# Patient Record
Sex: Female | Born: 1950
Health system: Southern US, Community
[De-identification: ages and names within clinical notes are randomized; demographics above are authoritative.]

## PROBLEM LIST (undated history)

## (undated) DIAGNOSIS — C801 Malignant (primary) neoplasm, unspecified: Secondary | ICD-10-CM

## (undated) DIAGNOSIS — N289 Disorder of kidney and ureter, unspecified: Secondary | ICD-10-CM

## (undated) DIAGNOSIS — M1990 Unspecified osteoarthritis, unspecified site: Secondary | ICD-10-CM

## (undated) DIAGNOSIS — I639 Cerebral infarction, unspecified: Secondary | ICD-10-CM

## (undated) DIAGNOSIS — D849 Immunodeficiency, unspecified: Secondary | ICD-10-CM

## (undated) DIAGNOSIS — E119 Type 2 diabetes mellitus without complications: Secondary | ICD-10-CM

## (undated) DIAGNOSIS — I10 Essential (primary) hypertension: Secondary | ICD-10-CM

## (undated) DIAGNOSIS — M329 Systemic lupus erythematosus, unspecified: Secondary | ICD-10-CM

## (undated) HISTORY — PX: CATARACT EXTRACTION: SUR2

## (undated) HISTORY — PX: TONSILLECTOMY: SUR1361

---

## 2001-10-09 ENCOUNTER — Encounter: Admission: RE | Admit: 2001-10-09 | Discharge: 2002-01-07 | Payer: Self-pay | Admitting: *Deleted

## 2001-10-12 ENCOUNTER — Other Ambulatory Visit: Admission: RE | Admit: 2001-10-12 | Discharge: 2001-10-12 | Payer: Self-pay | Admitting: *Deleted

## 2008-05-15 ENCOUNTER — Ambulatory Visit: Payer: Self-pay | Admitting: Cardiology

## 2008-05-15 ENCOUNTER — Inpatient Hospital Stay (HOSPITAL_COMMUNITY): Admission: EM | Admit: 2008-05-15 | Discharge: 2008-05-20 | Payer: Self-pay | Admitting: Emergency Medicine

## 2008-05-16 ENCOUNTER — Ambulatory Visit: Payer: Self-pay | Admitting: Vascular Surgery

## 2008-05-16 ENCOUNTER — Encounter (INDEPENDENT_AMBULATORY_CARE_PROVIDER_SITE_OTHER): Payer: Self-pay | Admitting: Internal Medicine

## 2008-05-20 ENCOUNTER — Encounter: Payer: Self-pay | Admitting: Cardiology

## 2008-06-03 ENCOUNTER — Encounter: Admission: RE | Admit: 2008-06-03 | Discharge: 2008-09-01 | Payer: Self-pay | Admitting: Internal Medicine

## 2008-07-09 ENCOUNTER — Inpatient Hospital Stay (HOSPITAL_COMMUNITY): Admission: EM | Admit: 2008-07-09 | Discharge: 2008-07-12 | Payer: Self-pay | Admitting: Emergency Medicine

## 2008-09-10 ENCOUNTER — Encounter: Admission: RE | Admit: 2008-09-10 | Discharge: 2008-10-21 | Payer: Self-pay | Admitting: Internal Medicine

## 2011-03-09 NOTE — Discharge Summary (Signed)
Rhonda Contreras, Rhonda Contreras                ACCOUNT NO.:  [id number]   MEDICAL RECORD NO.:  [id number]          PATIENT TYPE:  INP   LOCATION:  5023                         FACILITY:  [facility]   PHYSICIAN:  Georgann Housekeeper, MD      DATE OF BIRTH:  Dec 24, 1950   DATE OF ADMISSION:  07/09/2008  DATE OF DISCHARGE:  07/12/2008                               DISCHARGE SUMMARY   DISCHARGE DIAGNOSES:  1. Acute renal failure.  2. Dehydration.  3. Elevated INR.  4. Hypertension.  5. History of cerebrovascular accident with right-sided weakness.  6. Diabetes.   MEDICATION ON DISCHARGE:  1. Coumadin 2.5 mg daily with 5 mg Wednesday.  2. Metoprolol 100 mg b.i.d.  3. Pravachol 40 mg daily.  4. Amaryl 1 mg daily.  5. Aspirin 81 mg daily.  6. Metformin 1000 mg in evening.  7. Lisinopril HCT 10/25 on hold, followup in 1 week.   LABORATORY DATA:  Blood work on admission, creatinine 4.7, BUN of 13,  sodium 136, and potassium 5.2.  On discharge, creatinine was 1.16, BUN  of 18, potassium 3.5, sodium 139, and glucose 113.  White count was 8.1  and hemoglobin 11.9.  LFTs were normal.  INR on admission was 6.6.  At  the time of discharge was 1.3.   On vitals, blood pressure 129/71, pulse 85, 97.9 temperature, and sats  99% on room air.   HOSPITAL COURSE:  A 60 year old female with history of bilateral CVA  with right-sided weakness, diabetes, hypertension, and depression came  in with nausea, vomiting, acute dehydration, and acute renal failure  with creatinine up to 4.7.  She also had some nosebleed and found to  have elevated INR.  1. Dehydration and acute renal failure.  She was aggressively      hydrated.  Her metformin and lisinopril was held and with      aggressive hydration, her renal function completely resolved.      Creatinine back to normal at 1.16 and her electrolytes corrected.      As far as her blood pressure remained stable, she was continued on      metoprolol only.  As far as her  hypertension, the patient will      continue on metoprolol and we will follow up outpatient blood      pressure if elevated might consider adding another blood pressure      agent either ACE inhibitor without the diuretics as far as.  2. Type 2 diabetes.  Her metformin was hold as well as Amaryl.  Her      blood sugars initially all remained normal within 100.  When she      starts eating, we will start her back on Amaryl 1 mg and metformin      1000 mg a day and the patient starts eating in the hospital as far      as the hypercoagulable anticoagulation, so supratherapeutic INR in      admission was causing her nosebleeds.  Her Coumadin was held as      well as aspirin.  Nosebleed resolved.  Hemoglobin remained stable.      She had no evidence of any GI bleed as far as she will restart back      on baby      aspirin as well as the Coumadin with the same home dose.  Followup      INR at the office as far as her dyslipidemia.  Continue on      Pravachol.  The patient's Celexa was discontinued.  We thought was      causing her nausea and followup in 1 week at the office.      Georgann Housekeeper, MD  Electronically Signed     KH/MEDQ  D:  07/12/2008  T:  07/12/2008  Job:  [id number]

## 2011-03-09 NOTE — Discharge Summary (Signed)
NAMECALAIS, SVEHLA                ACCOUNT NO.:  [id number]   MEDICAL RECORD NO.:  [id number]          PATIENT TYPE:  INP   LOCATION:  3705                         FACILITY:  [facility]   PHYSICIAN:  Lonia Blood, M.D.       DATE OF BIRTH:  1951/06/30   DATE OF ADMISSION:  05/15/2008  DATE OF DISCHARGE:  05/20/2008                               DISCHARGE SUMMARY   PRIMARY CARE PHYSICIAN:  Georgann Housekeeper, MD, Bennye Alm.   DISCHARGE DIAGNOSES:  For complete list of discharge diagnoses, please  refer to the previously dictated discharge summary done by Dr. Marthann Schiller.  Briefly, though this is a patient with bilateral strokes,  hypertension, diabetes mellitus type 2, hyperlipidemia, and positive  lupus anticoagulant.   DISCHARGE MEDICATIONS:  1. Coumadin 5 mg by mouth at 6 p.m. daily.  2. Aspirin 81 mg daily.  3. Lipitor 20 mg daily.  4. Metformin 1000 mg twice a day.  5. Lisinopril/hydrochlorothiazide 10/25 mg daily.  6. Metoprolol 100 mg twice a day.  7. Glimepiride 1 mg each morning.   CONDITION ON DISCHARGE:  Ms. Pinkley is discharged in fair condition.  She  is able to ambulate on her own.  She seems to be alert and oriented to  place, person, and time.  She has slight dysarthria, some facial  asymmetry, and she has a right upper extremity 3/5 hemiparesis.  The  patient is set up to follow up with outpatient rehabilitation center at  [facility] on [address]  The patient is set up to  follow up with Dr. Donette Larry on May 23, 2008, at 1:15 p.m. office of the  [facility] [place].   PROCEDURES DURING THIS ADMISSION:  1. May 16, 2008, the patient underwent an MRI of the brain with      findings of scattered acute small vessel infarctions throughout      both cerebral hemispheres suggesting embolic disease.  2. May 16, 2008, carotid ultrasounds which shows no significant ICA      stenosis.  3. May 20, 2008, transesophageal echocardiogram by Dr. Olga Millers      with findings of normal left ventricular function, normal left      atrium, no left appendage thrombus, normal right atrium and right      ventricle, and no pericardial effusion.   CONSULTATIONS DURING THIS ADMISSION:  This patient was seen in  consultation by Dr. Kelli Hope from Neurology and by Dr. Pearlean Brownie  from Neurology.   HOSPITAL COURSE:  For complete list of hospital course, refer to the  previously dictated discharge summary done by Dr. Marthann Schiller.  Basically today, May 20, 2008, Ms. Paulson' neurological status has been  found to be stable.  She is already therapeutic on her Coumadin with a  discharge INR of 2.1.  The patient's antiphospholipid antibody syndrome  panel came back positive and she will need a referral as an outpatient  to a hematologist.  We  have elected to treat the patient with Coumadin and aspirin for her  strokes as recommended by Dr. Pearlean Brownie  from Neurology.  Ms. Claytor will be  also treated for diabetes and hypertension and she will follow up  closely for Coumadin adjustments with her new primary care physician.      Lonia Blood, M.D.  Electronically Signed     SL/MEDQ  D:  05/20/2008  T:  05/21/2008  Job:  [id number]   cc:   Georgann Housekeeper, MD

## 2011-03-09 NOTE — H&P (Signed)
Rhonda Contreras, TROSTLE                ACCOUNT NO.:  [id number]   MEDICAL RECORD NO.:  [id number]          PATIENT TYPE:  INP   LOCATION:  5023                         FACILITY:  [facility]   PHYSICIAN:  Georgann Housekeeper, MD      DATE OF BIRTH:  1951-07-02   DATE OF ADMISSION:  07/09/2008  DATE OF DISCHARGE:                              HISTORY & PHYSICAL   CHIEF COMPLAINT:  Weakness, nausea, vomiting, and nosebleed.   HISTORY OF PRESENT ILLNESS:  A 60 year old female with past medical  history of recent CVA bilateral with right-sided weakness, diabetes,  hypertension, and depression, lives with her sister, came in complaining  of nausea, vomiting, not able to keep anything down for last 5 days, and  episode of intermittent nosebleed.  She also noticed last night episode  of some blood in the rectum, none today.  The patient denies any fever.  No chest pain.  No abdominal pain.  No shortness of breath.  No  headache.  She has lost about 8 or 10 pounds since last week and the  sister says she is not eating, feel weak to get up and walk, was doing  good with the therapy.  She is on Coumadin and baby aspirin.  In the  office, her blood work, she found to have INR of 6.6 with the blood  chemistries; creatinine of 4.7 and BUN of 105.  Hemoglobin 14.4.  Normal  electrolytes.  White count of 12.4, admitted for management and further  care.   PAST MEDICAL HISTORY:   ALLERGIES:  No known drug allergies.   CURRENT MEDICATIONS:  1. Lisinopril and hydrochlorothiazide 10/25 one a day.  2. Metoprolol 100 mg b.i.d.  3. Aspirin 81 mg daily.  4. Coumadin 2.5 mg daily with 5 on Wednesday.  5. Amaryl 1 mg daily.  6. Metformin 1000 mg b.i.d.  7. Pravachol 40 mg nightly.  8. Celexa 10 mg daily.   PAST MEDICAL HISTORY:  1. CVA in July 2009, bilateral with right-sided weakness.  2. Hypercoagulable workup, positive lupus anticoagulant.  3. Hyperlipidemia.  4. Type 2 diabetes.  5. Hypertension.  6.  Depression.   SOCIAL HISTORY:  She lives now with her sister, Rhonda Contreras.  No  children.  No tobacco or alcohol.   PHYSICAL EXAMINATION:  VITAL SIGNS:  Blood pressure 120/70, temperature  98.3, pulse 72, and weight 108 pounds.  GENERAL:  Weak-appearing female.  HEENT:  Some dry blood in either side of the nose with some fresh blood  on the tissue on both nasal passages were mildly congested.  Oropharynx  clear.  NECK:  Supple.  LUNGS:  Clear.  CARDIAC:  Regular rhythm without murmur.  ABDOMEN:  Soft and nontender.  RECTAL:  With chaperon, guaiac-negative brown stool.   LABORATORY DATA:  Hemoglobin of 14.3, white count of 12.4, and platelets  188.  INR 6.6.  Potassium 4.4, BUN of 105, creatinine 4.7, sodium 136,  chloride 98, and glucose 152.   IMPRESSION:  A 60 year old female with history of recent stroke;  hypertension; and diabetes, on Coumadin and aspirin, presented with  acute-on-chronic renal failure secondary to dehydration, nausea,  vomiting, and elevated INR.   PLAN:  1. Admit to hospital, IV fluids.  2. We will hold her metformin, lisinopril, as well as Celexa.  Nausea      and vomiting, most likely Celexa related.  We will hold that and      also from uremia.  3. Acute renal failure.  She had mild renal insufficiency with      creatinine 1.6 last month.  Aggressive IV fluids and repeat blood      chemistries.  4. As far as diabetes, hold metformin and Amaryl, sliding scale      insulin.  5. Hypertension.  Continue on metoprolol.  Repeat blood work Advertising account executive.      Georgann Housekeeper, MD  Electronically Signed     KH/MEDQ  D:  07/09/2008  T:  07/10/2008  Job:  [id number]

## 2011-03-09 NOTE — Consult Note (Signed)
Rhonda Contreras, Rhonda Contreras                ACCOUNT NO.:  [id number]   MEDICAL RECORD NO.:  [id number]          PATIENT TYPE:  INP   LOCATION:  3705                         FACILITY:  [facility]   PHYSICIAN:  Michael L. Reynolds, M.D.DATE OF BIRTH:  Aug 18, 1951   DATE OF CONSULTATION:  05/18/2008  DATE OF DISCHARGE:                                 CONSULTATION   REQUESTING PHYSICIAN:  Altha Harm, MD.   REASON FOR EVALUATION:  Stroke.   HISTORY OF PRESENT ILLNESS:  This is the initial inpatient consultation  evaluation of this 60 year old woman with a past medical history which  includes untreated hypertension.  She presented to the emergency  department on May 15, 2008, in the afternoon after experiencing onset  that morning of slurred speech and generalized weakness, worse on the  right side.  She was out of the window for acute intervention and code  stroke was not called.  Symptoms then improved somewhat, she was  admitted to the hospital service.  She has undergone a stroke workup.  She had an MRI of the brain with MRA of intracranial circulation, which  demonstrated the presence of scattered acute stroke, left greater than  right, with the largest being in the left frontoparietal subcortical  white matter.  The pattern suggests embolic disease.  MRA was  unremarkable.  She had 2-D echocardiogram which was hyperdynamic,  without wall motion abnormalities, and demonstrating some obliteration  of the ventricular space.  She has had a hypercoagulable panel, which  demonstrated the presence of a lupus anticoagulant.  She was started on  Aggrenox on admission and lupus anticoagulants came back, she was  started on heparin and Coumadin.  She has had significant issues with  blood pressure elevations before, which has lead her to be transferred  back to the ICU, and she received several doses of intravenous  medications for blood pressure.  Today, she reports that her weakness is  actually  worse today than it was yesterday in her arm, although she is  still doing well with swallowing.  She states the speech is little bit  slurred.  Neurologic consultation is requested for further  considerations.   PAST MEDICAL HISTORY:  She states that she has been told that she was  hypertensive in the past, was on blood pressure medicines in the past,  but after she lost her job was unable to afford these.  She also was  told she was borderline diabetic in the past, and had been trying to  control that with diet.   FAMILY/SOCIAL/REVIEW OF SYSTEMS:  Per admission H&P of May 15, 2008,  which is reviewed.  Of note, her mother died at young age from MI, and  she is nonsmoker.   MEDICATIONS:  She was on no medications prior to admission.  Her present  list of medications include Aggrenox, Lipitor, intravenous heparin,  hydrochlorothiazide, sliding scale insulin, metformin, metoprolol 50 mg  b.i.d., Protonix, and Coumadin per protocol.   PHYSICAL EXAMINATION:  VITAL SIGNS:  Temperature 97.8, blood pressure  123/36, pulse 86, respirations 16, and O2 sat 93% on  room air.  GENERAL:  This is a healthy-appearing woman, supine in the hospital bed,  in no distress.  HEENT:  Head, cranial nerves normocephalic and atraumatic.  Oropharynx  benign.  NECK:  Supple without carotid or supraclavicular bruits.  HEART:  Regular rate without murmurs.  NEUROLOGICAL:  On mental status, she is awake and alert.  She is  oriented to time, place, and person.  Recent and remote memory are  intact.  Attention span, concentration, and fund of knowledge are all  appropriate for the situation.  Speech is fluent and mildly dysarthric.  She is able to name objects and repeat phrases.  Cranial nerves, pupils  are equal and reactive.  Extraocular movements are full without  nystagmus.  Visual fields full to confrontation.  Hearing is intact to  conversation and speech.  Facial sensation is intact to pinprick.  She   has significant right facial paralysis, but can move the tongue and  palate in the midline.  Motor, normal bulk and tone.  She has just  antigravity strength in the proximal right upper extremity, really  cannot move or cannot extend the right fingers at all, there is some  flexion of the right fingers.  Strength in the right lower extremity is  normal and strength in the left is normal.  Sensation is intact to light  touch and double stimulation in all extremities.  Coordination, finger-  to-nose is performed well on the left and cannot be done on the right.  She is able to do heel-to-shin on the right.  Gait is deferred.  Reflexes are 1+ and symmetric.  Toes are upgoing bilaterally.   LABORATORY DATA:  CBC from this morning; white count 9.4, hemoglobin  14.6, and platelets 161,000.  INR this morning is 1.0.  Hypercoagulable  demonstrates presence of lupus anticoagulant as above.  Homocysteine  level done.  Fasting is normal.  RPR is negative.  B12 is normal.  Hemoglobin A1c is 9.0.  TSH is normal.  Lipids are significantly  abnormal, demonstrating total cholesterol 228, triglycerides 218, HDL  31, and calculated LDL 153.  MRI of the brain with MRA of intracerebral  circulation is as above.  A 2-D echo demonstrates hyperdynamic LV, no  wall motion abnormalities, some midcavity left ventricular obliteration  was seen.  She also has a moderate-to-marked LVH.  Telemetry monitoring  showed no arrhythmias.   IMPRESSION:  1. Multiple acute left greater than right brain strokes, suspect      cardio or aortoembolic, with dysarthria and right upper extremity      weakness.  This is worsened today which may be due to hemorrhagic      transformation related to heparin, or possibly due to [facility]      treatment of her blood pressure.  2. Multiple uncontrolled vascular risk factors including hypertension,      diabetes, and hypercholesterolemia.  3. Possible lupus anticoagulant without  evidence of a clear history of      hypercoagulable syndrome.   RECOMMENDATIONS:  Check CT now to exclude hemorrhagic transformation of  worsening of the stroke and discontinue all p.r.n. blood pressure  medications.  She needs transesophageal echo.  For now, we will continue  anticoagulation, although I am not convinced that this is indicated long-  term, and also that she will be an appropriate candidate given that she  has been noncompliant with medication for few years.  Stroke consult  service to follow with you.   Thank you for  this consultation.      Michael L. Thad Ranger, M.D.  Electronically Signed     MLR/MEDQ  D:  05/18/2008  T:  05/18/2008  Job:  [id number]

## 2011-03-09 NOTE — H&P (Signed)
Rhonda Contreras, Rhonda Contreras                ACCOUNT NO.:  [id number]   MEDICAL RECORD NO.:  [id number]          PATIENT TYPE:  INP   LOCATION:  3728                         FACILITY:  [facility]   PHYSICIAN:  Lonia Blood, M.D.      DATE OF BIRTH:  15-Apr-1951   DATE OF ADMISSION:  05/15/2008  DATE OF DISCHARGE:                              HISTORY & PHYSICAL   PRIMARY CARE PHYSICIAN:  The patient is unassigned to [place].  She has not  seen any doctor in 3 years.   PRESENTING COMPLAINT:  Slurred speech and left upper arm weakness.   HISTORY OF PRESENT ILLNESS:  The patient is a 60 year old female with no  significant past medical history who has not seen a doctor in about 3  years.  She has been doing fine until this afternoon when she  experienced sudden weakness in her right upper extremity.  She was also  noted to have slurred speech.  The patient was rushed over here by EMS.  Her speech has improved, but not normalized.  Denied any fever.  No  weakness in her lower extremities.  No nausea or vomiting, no diarrhea.  The patient does not know her cholesterol status.   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   ALLERGIES:  She has no known drug allergies.   MEDICATIONS:  None.   SOCIAL HISTORY:  The patient lives in [place].  Denied any tobacco  use.  She drinks wine on and off socially.   FAMILY HISTORY:  Mother died at the age of 22 from MI.  Sister has  diabetes and hypertension.   REVIEW OF SYSTEMS:  12-point review of systems is negative except per  HPI.   PHYSICAL EXAMINATION:  VITAL SIGNS:  Temperature 97.3, blood pressure  210/99, lying down to 193/111, pulse 140, respiratory rate 24, and sats  94% on room air.  GENERAL:  The patient is pleasant, awake, alert, oriented in no acute  distress.  HEENT:  The patient has right facial droop with some mild slurred  speech.  No pallor, no jaundice.  NECK:   Supple.  No JVD.  No lymphadenopathy.  RESPIRATORY:  The patient has good air entry bilaterally.  No wheezes.  No rales.  CARDIOVASCULAR SYSTEM:  She has S1 and S2, no murmurs.  ABDOMEN:  Soft and nontender with positive bowel sounds.  EXTREMITIES:  No edema, cyanosis, or clubbing.  NEUROLOGIC:  Cranial nerves other than the facial seemed to be intact.  She has facial droop on the left as indicated.  She has power 4/5 in the  right upper extremities.  No change in strength in the left upper  extremity.  She has 5/5 in both lower extremities respectively.  Reflexes 2+ in upper and lower extremities respectively.   LABORATORY DATA:  White count 8.2, hemoglobin 15.1, and platelet count  174.  PTT is 37.  Her head CT without contrast showed no evidence for  intracranial hemorrhage, numerous deep white matter, thalamic and basal  ganglia lucencies, likely chronic  but small focus of acute infection,  cannot be completely excluded.   EKG showed sinus tachycardia with a rate of 114, normal intervals.  Poor  R-wave progression.   ASSESSMENT:  This is a 60 year old presenting with what appears to be an  acute cerebrovascular accident.  The patient still has residual left  facial droop, as well as right upper extremity weakness, indicating that  this lesion is more central in nature.   PLAN:  1. Acute CVA.  We will admit the patient.  The patient has already      passed swallow evaluation bedside, so we can go ahead and feed her      as necessary.  We will check MRI, MRA of the brain, carotid      Dopplers, 2-D echo, and homocysteine level.  I will check RPR and      B12 levels as well.  Get PT, OT involved and also get the stroke      team involved.  2. Hypertension.  The patient denied being a hypertensive.  In the      setting of acute CVA, however, we will get persmissive blood      pressure of a systolic between 098 and 180.  I will, therefore, use      IV labetalol p.r.n. to keep the  blood pressure in that range.      Ultimately, the patient will have to be on blood pressure      medications.  3. Unknown cholesterol status.  We will check fasting lipid panel to      decide for what the patient's cholesterol level is and see if we      need to get a stat.  Otherwise, further treatment will depend on      the patient's response in hospital.      Lonia Blood, M.D.  Electronically Signed     LG/MEDQ  D:  05/15/2008  T:  05/16/2008  Job:  [id number]

## 2011-03-09 NOTE — Discharge Summary (Signed)
Rhonda Contreras, Rhonda Contreras                ACCOUNT NO.:  [id number]   MEDICAL RECORD NO.:  [id number]          PATIENT TYPE:  INP   LOCATION:  3705                         FACILITY:  [facility]   PHYSICIAN:  Altha Harm, MDDATE OF BIRTH:  Aug 02, 1951   DATE OF ADMISSION:  05/15/2008  DATE OF DISCHARGE:                               DISCHARGE SUMMARY   INTERIM DISCHARGE SUMMARY.   DATE OF INTERIM DISCHARGE SUMMARY:  May 19, 2008.   DISCHARGE DIAGNOSES:  1. Bilateral cerebrovascular accidents with right hemiparesis.  2. Newly diagnosed hypertension, poorly controlled.  3. Newly diagnosed diabetes type 2.  4. Newly diagnosed hyperlipidemia.  5. Positive lupus anticoagulant, awaiting confirmatory      antiphospholipid antibody testing.   DISCHARGE MEDICATIONS:  To be determined at the time of discharge.   CONSULTANTS:  1. Michael L. Thad Ranger, MD, Neurology.  2. [facility].   PROCEDURES:  None so far, however patient anticipating transesophageal  echocardiogram to be done tomorrow, May 20, 2008.   DIAGNOSTIC STUDIES:  1. CT head without contrast done on admission which showed:  No      evidence of intracranial hemorrhage.  Numerous deep white matter      thalamic and basal ganglia lucencies, likely chronic but small foci      of acute infarction cannot be completely excluded.  2. MRI of the head without contrast which showed a background pattern      of old small vessel infarctions throughout the brain.  Scattered      acute small vessel infarctions throughout both cerebral      hemispheres, more numerous on the left than the right.  The      findings suggest embolic disease from a cardiac or ascending aorta      source.  3. MRA of the head which shows normal appearance of the large and      medium-sized vessels.  4. CT of the head without contrast done on July 25th which shows small      acute infarcts seen on MRI are not as well visualized on CT scan,      no evidence  of hemorrhage or new abnormality.  5. Carotid Dopplers which shows no significant right ICA stenosis or      no significant left ICA stenosis.   PERTINENT LABORATORY STUDIES:  Lupus anticoagulant positive, awaiting  results of antiphospholipid antibody, homocysteine levels within normal  limits.  Antithrombin 3, protein C, protein S all within normal or  elevated limits.  Total cholesterol 228, triglycerides 218, LDL 153,  VLDL 44, HDL 31, RPR nonreactive.   CODE STATUS:  Full code.   ALLERGIES:  No known drug allergies.   PRIMARY CARE PHYSICIAN:  Unassigned.   CHIEF COMPLAINT:  Slurred speech and left upper arm weakness.   HISTORY OF PRESENT ILLNESS:  Please see the H&P dictated by Dr. Mikeal Hawthorne  for details of the HPI.  However, this is a right-handed female who  presented with slurred speech and right upper extremity weakness.  The  patient was out of the window for any thrombolytics.  We  are asked to  admit the patient for new stroke.  1. CVA.  The patient presented with clear signs of having had a CVA.      The radiologic studies confirmed that the patient had a brain      stroke with multiple areas of acute infarction bilaterally.      However, her symptoms are more prominent on the right side.  The      pattern of stroke certainly presented as though embolic and the      patient had a transthoracic echocardiogram which did not show any      evidence of emboli.  The patient has been scheduled for a      transesophageal echocardiogram to be performed tomorrow, on May 20, 2008, by the [facility] group.  In addition, the      patient had a positive lupus anticoagulant found on laboratory      evaluation.  The confirmatory antiphospholipid antibody is still      pending at this time.  In light of this, the patient was started on      full-dose Lovenox and also started on Coumadin.  Dependent upon the      results of the TEE, the patient may be able to discontinue  Coumadin      if there is no evidence of an embolic focus.  However, in terms of      lupus anticoagulant, it might be prudent to speak with the      hematologist and have their input as to if the patient has a      positive by phospholipid antibody, the duration for which she will      make anticoagulation or if there are any other modulating factors      that can be employed in this patient.  The patient was evaluated by      Physical Therapy, Occupational Therapy and Speech Therapy and they      have recommended that the patient continue with outpatient      occupational therapy and speech therapy.  The patient has      progressively improved her function over the last few days and has      had no further derangement in functions or no further symptoms of a      stroke.  2. Hyperlipidemia.  The patient was found to be hyperlipidemic and has      been started on a statin.  Recommendations are that the patient has      her lipids rechecked in 4-6 weeks for further titration of her      medications.  3. Diabetes type 2.  The patient was found to have diabetes type 2      which was previously undiagnosed.  However, the patient does have a      strong family history of diabetes.  Her hemoglobin A1c was found to      be 9.0.  The patient has been started on Glucophage and is      currently on a dosage of 1000 mg Glucophage in the morning and 500      mg in the evening.  The patient's blood sugar seems to be      adequately controlled, however she will need further titration as      an outpatient.  4. Hypertension.  The patient was initially normotensive on arrival to      the hospital, however quickly revealed that  she was hypertensive      with blood pressures ranging into the upwards of 200 systolic.  The      patient had to be taken to the ICU and placed on a labetalol drip,      which brought her blood pressures down nicely.  However, the      patient has continued to have spiking  blood pressures and her      medications are being titrated presently.  The patient is currently      on Lopressor 100 mg b.i.d. in addition to lisinopril 10 mg p.o.      daily and hydrochlorothiazide 25 mg p.o. daily.  If the patient      continues to spike on the Lopressor, she may need a switch over to      labetalol or a calcium channel blocker.   This brings the patient's hospital stay current up to today, May 19, 2008.   TOTAL TIME SPENT:  Thirty-seven minutes.     Altha Harm, MD  Electronically Signed    MAM/MEDQ  D:  05/19/2008  T:  05/19/2008  Job:  [id number]

## 2011-07-23 LAB — POCT I-STAT, CHEM 8
BUN: 13
HCT: 45
Hemoglobin: 15.3 — ABNORMAL HIGH
Sodium: 137
TCO2: 23

## 2011-07-23 LAB — URINALYSIS, MICROSCOPIC ONLY
Bilirubin Urine: NEGATIVE
Hgb urine dipstick: NEGATIVE
Ketones, ur: 15 — AB
Nitrite: NEGATIVE
Urobilinogen, UA: 1

## 2011-07-23 LAB — CK TOTAL AND CKMB (NOT AT ARMC): Relative Index: INVALID

## 2011-07-23 LAB — CBC
HCT: 43
HCT: 43
HCT: 43.5
Hemoglobin: 14.4
Hemoglobin: 14.7
Hemoglobin: 15.1 — ABNORMAL HIGH
MCHC: 33.5
MCHC: 33.9
MCV: 94.3
Platelets: 161
RBC: 4.55
RBC: 4.61
RBC: 4.75
RDW: 11.8
RDW: 12.1
RDW: 12.5

## 2011-07-23 LAB — BASIC METABOLIC PANEL
CO2: 19
CO2: 22
Chloride: 106
GFR calc Af Amer: >60
GFR calc non Af Amer: >60
Glucose, Bld: 120 — ABNORMAL HIGH
Potassium: 3.7
Sodium: 139
Sodium: 141

## 2011-07-23 LAB — PROTIME-INR
INR: 0.9
INR: 1
INR: 2.1 — ABNORMAL HIGH

## 2011-07-23 LAB — ANTIPHOSPHOLIPID SYNDROME EVAL, BLD
Antiphosphatidylserine IgM: 99 MPS U/mL — ABNORMAL HIGH (ref <25.0)
DRVVT: 69.7 — ABNORMAL HIGH (ref 36.1–47.0)
PTT Lupus Anticoagulant: 98.2 — ABNORMAL HIGH (ref 36.3–48.8)
dRVVT Incubated 1:1 Mix: 40.9 (ref 36.1–47.0)

## 2011-07-23 LAB — BETA-2-GLYCOPROTEIN I ABS, IGG/M/A
Beta-2 Glyco I IgG: 41 U/mL (ref <20)
Beta-2-Glycoprotein I IgA: 15 U/mL (ref <10)
Beta-2-Glycoprotein I IgM: 27 U/mL (ref <10)

## 2011-07-23 LAB — DIFFERENTIAL
Basophils Absolute: 0
Basophils Relative: 1
Eosinophils Absolute: 0
Lymphocytes Relative: 11 — ABNORMAL LOW
Monocytes Absolute: 0.4
Monocytes Absolute: 0.6
Monocytes Relative: 7
Neutro Abs: 6.9

## 2011-07-23 LAB — LIPID PANEL
Cholesterol: 228 — ABNORMAL HIGH
HDL: 31 — ABNORMAL LOW
Total CHOL/HDL Ratio: 7.4
Triglycerides: 218 — ABNORMAL HIGH

## 2011-07-23 LAB — COMPREHENSIVE METABOLIC PANEL
BUN: 10
CO2: 25
Calcium: 8.8
Creatinine, Ser: 0.46
GFR calc non Af Amer: >60
Glucose, Bld: 211 — ABNORMAL HIGH
Total Bilirubin: 1.1

## 2011-07-23 LAB — HEMOGLOBIN A1C: Hgb A1c MFr Bld: 9 — ABNORMAL HIGH

## 2011-07-23 LAB — HOMOCYSTEINE
Homocysteine: 8.4
Homocysteine: 9.8

## 2011-07-23 LAB — PROTEIN S, TOTAL: Protein S Ag, Total: 109 % (ref 70–140)

## 2011-07-23 LAB — VITAMIN B12: Vitamin B-12: 368 (ref 211–911)

## 2011-07-23 LAB — LUPUS ANTICOAGULANT PANEL: DRVVT: 71.8 — ABNORMAL HIGH (ref 36.1–47.0)

## 2011-07-23 LAB — PROTEIN C ACTIVITY: Protein C Activity: 175 % — ABNORMAL HIGH (ref 75–133)

## 2011-07-23 LAB — TSH: TSH: 1.388

## 2011-07-23 LAB — FACTOR 5 LEIDEN

## 2011-07-23 LAB — PROTEIN C, TOTAL: Protein C, Total: 86 % (ref 70–140)

## 2011-07-23 LAB — PROTHROMBIN GENE MUTATION

## 2011-07-23 LAB — B-NATRIURETIC PEPTIDE (CONVERTED LAB): Pro B Natriuretic peptide (BNP): 44

## 2011-07-23 LAB — TROPONIN I: Troponin I: 0.03

## 2011-07-23 LAB — APTT: aPTT: 37

## 2011-07-23 LAB — CARDIAC PANEL(CRET KIN+CKTOT+MB+TROPI)
CK, MB: 1.6
Relative Index: INVALID
Total CK: 71
Troponin I: 0.03

## 2011-07-23 LAB — CARDIOLIPIN ANTIBODIES, IGG, IGM, IGA: Anticardiolipin IgM: >80 (ref <10)

## 2011-07-26 LAB — GLUCOSE, CAPILLARY
Glucose-Capillary: 101 — ABNORMAL HIGH
Glucose-Capillary: 117 — ABNORMAL HIGH
Glucose-Capillary: 86
Glucose-Capillary: 96

## 2011-07-26 LAB — COMPREHENSIVE METABOLIC PANEL
ALT: 25
AST: 21
Albumin: 4.2
Calcium: 9.6
GFR calc Af Amer: 11 — ABNORMAL LOW
Sodium: 136
Total Protein: 7.8

## 2011-07-26 LAB — BASIC METABOLIC PANEL
BUN: 42 — ABNORMAL HIGH
CO2: 21
Calcium: 8.4
Chloride: 109
Chloride: 109
GFR calc Af Amer: 17 — ABNORMAL LOW
GFR calc Af Amer: 32 — ABNORMAL LOW
GFR calc Af Amer: 58 — ABNORMAL LOW
GFR calc non Af Amer: 14 — ABNORMAL LOW
Glucose, Bld: 84
Potassium: 3.2 — ABNORMAL LOW
Potassium: 3.5
Sodium: 138
Sodium: 139

## 2011-07-26 LAB — PROTIME-INR
INR: 1.3
INR: 2.5 — ABNORMAL HIGH
INR: 4.1 — ABNORMAL HIGH
Prothrombin Time: 28.9 — ABNORMAL HIGH

## 2011-07-26 LAB — CBC
Hemoglobin: 11.9 — ABNORMAL LOW
RDW: 12.9

## 2015-10-28 DIAGNOSIS — Z7901 Long term (current) use of anticoagulants: Secondary | ICD-10-CM | POA: Diagnosis not present

## 2015-12-04 DIAGNOSIS — I639 Cerebral infarction, unspecified: Secondary | ICD-10-CM | POA: Diagnosis not present

## 2015-12-04 DIAGNOSIS — Z0000 Encounter for general adult medical examination without abnormal findings: Secondary | ICD-10-CM | POA: Diagnosis not present

## 2015-12-04 DIAGNOSIS — Z1389 Encounter for screening for other disorder: Secondary | ICD-10-CM | POA: Diagnosis not present

## 2015-12-04 DIAGNOSIS — E1122 Type 2 diabetes mellitus with diabetic chronic kidney disease: Secondary | ICD-10-CM | POA: Diagnosis not present

## 2015-12-04 DIAGNOSIS — E782 Mixed hyperlipidemia: Secondary | ICD-10-CM | POA: Diagnosis not present

## 2015-12-04 DIAGNOSIS — G8190 Hemiplegia, unspecified affecting unspecified side: Secondary | ICD-10-CM | POA: Diagnosis not present

## 2015-12-04 DIAGNOSIS — Z7984 Long term (current) use of oral hypoglycemic drugs: Secondary | ICD-10-CM | POA: Diagnosis not present

## 2015-12-04 DIAGNOSIS — I10 Essential (primary) hypertension: Secondary | ICD-10-CM | POA: Diagnosis not present

## 2015-12-04 DIAGNOSIS — R760 Raised antibody titer: Secondary | ICD-10-CM | POA: Diagnosis not present

## 2015-12-04 DIAGNOSIS — Z7901 Long term (current) use of anticoagulants: Secondary | ICD-10-CM | POA: Diagnosis not present

## 2015-12-04 DIAGNOSIS — N183 Chronic kidney disease, stage 3 (moderate): Secondary | ICD-10-CM | POA: Diagnosis not present

## 2015-12-04 DIAGNOSIS — M810 Age-related osteoporosis without current pathological fracture: Secondary | ICD-10-CM | POA: Diagnosis not present

## 2015-12-25 DIAGNOSIS — Z7901 Long term (current) use of anticoagulants: Secondary | ICD-10-CM | POA: Diagnosis not present

## 2016-01-16 DIAGNOSIS — Z1231 Encounter for screening mammogram for malignant neoplasm of breast: Secondary | ICD-10-CM | POA: Diagnosis not present

## 2016-01-22 DIAGNOSIS — Z7901 Long term (current) use of anticoagulants: Secondary | ICD-10-CM | POA: Diagnosis not present

## 2016-02-19 DIAGNOSIS — Z7901 Long term (current) use of anticoagulants: Secondary | ICD-10-CM | POA: Diagnosis not present

## 2016-03-17 DIAGNOSIS — Z7901 Long term (current) use of anticoagulants: Secondary | ICD-10-CM | POA: Diagnosis not present

## 2016-03-23 DIAGNOSIS — S52502D Unspecified fracture of the lower end of left radius, subsequent encounter for closed fracture with routine healing: Secondary | ICD-10-CM | POA: Diagnosis not present

## 2016-03-23 DIAGNOSIS — H20012 Primary iridocyclitis, left eye: Secondary | ICD-10-CM | POA: Diagnosis not present

## 2016-03-25 DIAGNOSIS — H20012 Primary iridocyclitis, left eye: Secondary | ICD-10-CM | POA: Diagnosis not present

## 2016-03-29 DIAGNOSIS — H20012 Primary iridocyclitis, left eye: Secondary | ICD-10-CM | POA: Diagnosis not present

## 2016-04-01 DIAGNOSIS — S52502D Unspecified fracture of the lower end of left radius, subsequent encounter for closed fracture with routine healing: Secondary | ICD-10-CM | POA: Diagnosis not present

## 2016-04-01 DIAGNOSIS — E162 Hypoglycemia, unspecified: Secondary | ICD-10-CM | POA: Diagnosis not present

## 2016-04-01 DIAGNOSIS — E860 Dehydration: Secondary | ICD-10-CM | POA: Diagnosis not present

## 2016-04-06 DIAGNOSIS — H179 Unspecified corneal scar and opacity: Secondary | ICD-10-CM | POA: Diagnosis not present

## 2016-04-06 DIAGNOSIS — H20012 Primary iridocyclitis, left eye: Secondary | ICD-10-CM | POA: Diagnosis not present

## 2016-04-06 DIAGNOSIS — H5212 Myopia, left eye: Secondary | ICD-10-CM | POA: Diagnosis not present

## 2016-04-12 DIAGNOSIS — S52502D Unspecified fracture of the lower end of left radius, subsequent encounter for closed fracture with routine healing: Secondary | ICD-10-CM | POA: Diagnosis not present

## 2016-04-13 DIAGNOSIS — H20012 Primary iridocyclitis, left eye: Secondary | ICD-10-CM | POA: Diagnosis not present

## 2016-04-14 DIAGNOSIS — Z7901 Long term (current) use of anticoagulants: Secondary | ICD-10-CM | POA: Diagnosis not present

## 2016-04-23 DIAGNOSIS — Z7901 Long term (current) use of anticoagulants: Secondary | ICD-10-CM | POA: Diagnosis not present

## 2016-05-04 DIAGNOSIS — S52502D Unspecified fracture of the lower end of left radius, subsequent encounter for closed fracture with routine healing: Secondary | ICD-10-CM | POA: Diagnosis not present

## 2016-05-19 DIAGNOSIS — Z7901 Long term (current) use of anticoagulants: Secondary | ICD-10-CM | POA: Diagnosis not present

## 2016-06-17 DIAGNOSIS — N183 Chronic kidney disease, stage 3 (moderate): Secondary | ICD-10-CM | POA: Diagnosis not present

## 2016-06-17 DIAGNOSIS — Z7901 Long term (current) use of anticoagulants: Secondary | ICD-10-CM | POA: Diagnosis not present

## 2016-06-17 DIAGNOSIS — I10 Essential (primary) hypertension: Secondary | ICD-10-CM | POA: Diagnosis not present

## 2016-06-17 DIAGNOSIS — E1122 Type 2 diabetes mellitus with diabetic chronic kidney disease: Secondary | ICD-10-CM | POA: Diagnosis not present

## 2016-06-17 DIAGNOSIS — Z7984 Long term (current) use of oral hypoglycemic drugs: Secondary | ICD-10-CM | POA: Diagnosis not present

## 2016-06-17 DIAGNOSIS — E782 Mixed hyperlipidemia: Secondary | ICD-10-CM | POA: Diagnosis not present

## 2016-06-17 DIAGNOSIS — G8190 Hemiplegia, unspecified affecting unspecified side: Secondary | ICD-10-CM | POA: Diagnosis not present

## 2016-06-17 DIAGNOSIS — I639 Cerebral infarction, unspecified: Secondary | ICD-10-CM | POA: Diagnosis not present

## 2016-07-14 DIAGNOSIS — Z7901 Long term (current) use of anticoagulants: Secondary | ICD-10-CM | POA: Diagnosis not present

## 2016-07-27 DIAGNOSIS — Z7901 Long term (current) use of anticoagulants: Secondary | ICD-10-CM | POA: Diagnosis not present

## 2016-08-03 DIAGNOSIS — Z7901 Long term (current) use of anticoagulants: Secondary | ICD-10-CM | POA: Diagnosis not present

## 2016-09-03 DIAGNOSIS — H52223 Regular astigmatism, bilateral: Secondary | ICD-10-CM | POA: Diagnosis not present

## 2016-09-03 DIAGNOSIS — E119 Type 2 diabetes mellitus without complications: Secondary | ICD-10-CM | POA: Diagnosis not present

## 2016-09-03 DIAGNOSIS — Z23 Encounter for immunization: Secondary | ICD-10-CM | POA: Diagnosis not present

## 2016-09-03 DIAGNOSIS — H5201 Hypermetropia, right eye: Secondary | ICD-10-CM | POA: Diagnosis not present

## 2016-09-03 DIAGNOSIS — Z7901 Long term (current) use of anticoagulants: Secondary | ICD-10-CM | POA: Diagnosis not present

## 2016-09-03 DIAGNOSIS — Z7984 Long term (current) use of oral hypoglycemic drugs: Secondary | ICD-10-CM | POA: Diagnosis not present

## 2016-09-30 DIAGNOSIS — Z7901 Long term (current) use of anticoagulants: Secondary | ICD-10-CM | POA: Diagnosis not present

## 2016-11-03 DIAGNOSIS — Z7901 Long term (current) use of anticoagulants: Secondary | ICD-10-CM | POA: Diagnosis not present

## 2016-12-01 DIAGNOSIS — Z7901 Long term (current) use of anticoagulants: Secondary | ICD-10-CM | POA: Diagnosis not present

## 2016-12-21 DIAGNOSIS — Z0000 Encounter for general adult medical examination without abnormal findings: Secondary | ICD-10-CM | POA: Diagnosis not present

## 2016-12-21 DIAGNOSIS — N183 Chronic kidney disease, stage 3 (moderate): Secondary | ICD-10-CM | POA: Diagnosis not present

## 2016-12-21 DIAGNOSIS — E782 Mixed hyperlipidemia: Secondary | ICD-10-CM | POA: Diagnosis not present

## 2016-12-21 DIAGNOSIS — R760 Raised antibody titer: Secondary | ICD-10-CM | POA: Diagnosis not present

## 2016-12-21 DIAGNOSIS — G8190 Hemiplegia, unspecified affecting unspecified side: Secondary | ICD-10-CM | POA: Diagnosis not present

## 2016-12-21 DIAGNOSIS — Z1389 Encounter for screening for other disorder: Secondary | ICD-10-CM | POA: Diagnosis not present

## 2016-12-21 DIAGNOSIS — I10 Essential (primary) hypertension: Secondary | ICD-10-CM | POA: Diagnosis not present

## 2016-12-21 DIAGNOSIS — L719 Rosacea, unspecified: Secondary | ICD-10-CM | POA: Diagnosis not present

## 2016-12-21 DIAGNOSIS — M810 Age-related osteoporosis without current pathological fracture: Secondary | ICD-10-CM | POA: Diagnosis not present

## 2016-12-21 DIAGNOSIS — Z1159 Encounter for screening for other viral diseases: Secondary | ICD-10-CM | POA: Diagnosis not present

## 2016-12-21 DIAGNOSIS — I639 Cerebral infarction, unspecified: Secondary | ICD-10-CM | POA: Diagnosis not present

## 2016-12-21 DIAGNOSIS — E1122 Type 2 diabetes mellitus with diabetic chronic kidney disease: Secondary | ICD-10-CM | POA: Diagnosis not present

## 2017-01-18 DIAGNOSIS — Z7901 Long term (current) use of anticoagulants: Secondary | ICD-10-CM | POA: Diagnosis not present

## 2017-01-31 DIAGNOSIS — Z1231 Encounter for screening mammogram for malignant neoplasm of breast: Secondary | ICD-10-CM | POA: Diagnosis not present

## 2017-02-15 DIAGNOSIS — Z7901 Long term (current) use of anticoagulants: Secondary | ICD-10-CM | POA: Diagnosis not present

## 2017-03-15 DIAGNOSIS — Z7901 Long term (current) use of anticoagulants: Secondary | ICD-10-CM | POA: Diagnosis not present

## 2017-04-12 DIAGNOSIS — Z7901 Long term (current) use of anticoagulants: Secondary | ICD-10-CM | POA: Diagnosis not present

## 2017-05-09 DIAGNOSIS — Z7901 Long term (current) use of anticoagulants: Secondary | ICD-10-CM | POA: Diagnosis not present

## 2017-06-14 DIAGNOSIS — E1165 Type 2 diabetes mellitus with hyperglycemia: Secondary | ICD-10-CM | POA: Diagnosis not present

## 2017-06-14 DIAGNOSIS — G8190 Hemiplegia, unspecified affecting unspecified side: Secondary | ICD-10-CM | POA: Diagnosis not present

## 2017-06-14 DIAGNOSIS — E1122 Type 2 diabetes mellitus with diabetic chronic kidney disease: Secondary | ICD-10-CM | POA: Diagnosis not present

## 2017-06-14 DIAGNOSIS — Z7984 Long term (current) use of oral hypoglycemic drugs: Secondary | ICD-10-CM | POA: Diagnosis not present

## 2017-06-14 DIAGNOSIS — L719 Rosacea, unspecified: Secondary | ICD-10-CM | POA: Diagnosis not present

## 2017-06-14 DIAGNOSIS — N183 Chronic kidney disease, stage 3 (moderate): Secondary | ICD-10-CM | POA: Diagnosis not present

## 2017-06-14 DIAGNOSIS — I10 Essential (primary) hypertension: Secondary | ICD-10-CM | POA: Diagnosis not present

## 2017-06-14 DIAGNOSIS — Z5181 Encounter for therapeutic drug level monitoring: Secondary | ICD-10-CM | POA: Diagnosis not present

## 2017-06-14 DIAGNOSIS — E782 Mixed hyperlipidemia: Secondary | ICD-10-CM | POA: Diagnosis not present

## 2017-06-14 DIAGNOSIS — R760 Raised antibody titer: Secondary | ICD-10-CM | POA: Diagnosis not present

## 2017-06-14 DIAGNOSIS — I639 Cerebral infarction, unspecified: Secondary | ICD-10-CM | POA: Diagnosis not present

## 2017-06-14 DIAGNOSIS — M810 Age-related osteoporosis without current pathological fracture: Secondary | ICD-10-CM | POA: Diagnosis not present

## 2017-07-12 DIAGNOSIS — Z7901 Long term (current) use of anticoagulants: Secondary | ICD-10-CM | POA: Diagnosis not present

## 2017-08-10 DIAGNOSIS — Z23 Encounter for immunization: Secondary | ICD-10-CM | POA: Diagnosis not present

## 2017-08-10 DIAGNOSIS — Z7901 Long term (current) use of anticoagulants: Secondary | ICD-10-CM | POA: Diagnosis not present

## 2017-08-29 DIAGNOSIS — E119 Type 2 diabetes mellitus without complications: Secondary | ICD-10-CM | POA: Diagnosis not present

## 2017-09-07 DIAGNOSIS — Z7901 Long term (current) use of anticoagulants: Secondary | ICD-10-CM | POA: Diagnosis not present

## 2017-09-07 DIAGNOSIS — M810 Age-related osteoporosis without current pathological fracture: Secondary | ICD-10-CM | POA: Diagnosis not present

## 2017-10-05 DIAGNOSIS — Z7901 Long term (current) use of anticoagulants: Secondary | ICD-10-CM | POA: Diagnosis not present

## 2017-10-27 DIAGNOSIS — R69 Illness, unspecified: Secondary | ICD-10-CM | POA: Diagnosis not present

## 2017-11-02 DIAGNOSIS — Z7901 Long term (current) use of anticoagulants: Secondary | ICD-10-CM | POA: Diagnosis not present

## 2017-11-30 DIAGNOSIS — Z7901 Long term (current) use of anticoagulants: Secondary | ICD-10-CM | POA: Diagnosis not present

## 2017-12-01 DIAGNOSIS — R69 Illness, unspecified: Secondary | ICD-10-CM | POA: Diagnosis not present

## 2017-12-22 DIAGNOSIS — R69 Illness, unspecified: Secondary | ICD-10-CM | POA: Diagnosis not present

## 2017-12-27 DIAGNOSIS — E1122 Type 2 diabetes mellitus with diabetic chronic kidney disease: Secondary | ICD-10-CM | POA: Diagnosis not present

## 2017-12-27 DIAGNOSIS — Z7901 Long term (current) use of anticoagulants: Secondary | ICD-10-CM | POA: Diagnosis not present

## 2017-12-27 DIAGNOSIS — M810 Age-related osteoporosis without current pathological fracture: Secondary | ICD-10-CM | POA: Diagnosis not present

## 2017-12-27 DIAGNOSIS — N183 Chronic kidney disease, stage 3 (moderate): Secondary | ICD-10-CM | POA: Diagnosis not present

## 2017-12-27 DIAGNOSIS — R760 Raised antibody titer: Secondary | ICD-10-CM | POA: Diagnosis not present

## 2017-12-27 DIAGNOSIS — Z7984 Long term (current) use of oral hypoglycemic drugs: Secondary | ICD-10-CM | POA: Diagnosis not present

## 2017-12-27 DIAGNOSIS — G8190 Hemiplegia, unspecified affecting unspecified side: Secondary | ICD-10-CM | POA: Diagnosis not present

## 2017-12-27 DIAGNOSIS — L719 Rosacea, unspecified: Secondary | ICD-10-CM | POA: Diagnosis not present

## 2017-12-27 DIAGNOSIS — Z1389 Encounter for screening for other disorder: Secondary | ICD-10-CM | POA: Diagnosis not present

## 2017-12-27 DIAGNOSIS — I10 Essential (primary) hypertension: Secondary | ICD-10-CM | POA: Diagnosis not present

## 2017-12-27 DIAGNOSIS — Z0000 Encounter for general adult medical examination without abnormal findings: Secondary | ICD-10-CM | POA: Diagnosis not present

## 2017-12-27 DIAGNOSIS — I639 Cerebral infarction, unspecified: Secondary | ICD-10-CM | POA: Diagnosis not present

## 2018-01-10 DIAGNOSIS — C44311 Basal cell carcinoma of skin of nose: Secondary | ICD-10-CM | POA: Diagnosis not present

## 2018-01-10 DIAGNOSIS — D2239 Melanocytic nevi of other parts of face: Secondary | ICD-10-CM | POA: Diagnosis not present

## 2018-01-10 DIAGNOSIS — L718 Other rosacea: Secondary | ICD-10-CM | POA: Diagnosis not present

## 2018-01-10 DIAGNOSIS — L218 Other seborrheic dermatitis: Secondary | ICD-10-CM | POA: Diagnosis not present

## 2018-01-10 DIAGNOSIS — D485 Neoplasm of uncertain behavior of skin: Secondary | ICD-10-CM | POA: Diagnosis not present

## 2018-01-16 DIAGNOSIS — R69 Illness, unspecified: Secondary | ICD-10-CM | POA: Diagnosis not present

## 2018-01-24 DIAGNOSIS — Z7901 Long term (current) use of anticoagulants: Secondary | ICD-10-CM | POA: Diagnosis not present

## 2018-02-01 ENCOUNTER — Other Ambulatory Visit: Payer: Self-pay

## 2018-02-01 ENCOUNTER — Inpatient Hospital Stay (HOSPITAL_COMMUNITY)
Admission: EM | Admit: 2018-02-01 | Discharge: 2018-02-09 | DRG: 481 | Disposition: A | Discharge status: Skilled Nursing Facility | Payer: Medicare HMO | Attending: Internal Medicine | Admitting: Internal Medicine

## 2018-02-01 ENCOUNTER — Emergency Department (HOSPITAL_COMMUNITY): Payer: Medicare HMO

## 2018-02-01 ENCOUNTER — Encounter (HOSPITAL_COMMUNITY): Payer: Self-pay

## 2018-02-01 DIAGNOSIS — D72829 Elevated white blood cell count, unspecified: Secondary | ICD-10-CM | POA: Diagnosis not present

## 2018-02-01 DIAGNOSIS — W010XXA Fall on same level from slipping, tripping and stumbling without subsequent striking against object, initial encounter: Secondary | ICD-10-CM | POA: Diagnosis not present

## 2018-02-01 DIAGNOSIS — Z85828 Personal history of other malignant neoplasm of skin: Secondary | ICD-10-CM

## 2018-02-01 DIAGNOSIS — W19XXXA Unspecified fall, initial encounter: Secondary | ICD-10-CM

## 2018-02-01 DIAGNOSIS — I5032 Chronic diastolic (congestive) heart failure: Secondary | ICD-10-CM | POA: Diagnosis not present

## 2018-02-01 DIAGNOSIS — S72332A Displaced oblique fracture of shaft of left femur, initial encounter for closed fracture: Secondary | ICD-10-CM

## 2018-02-01 DIAGNOSIS — E119 Type 2 diabetes mellitus without complications: Secondary | ICD-10-CM

## 2018-02-01 DIAGNOSIS — N289 Disorder of kidney and ureter, unspecified: Secondary | ICD-10-CM | POA: Diagnosis not present

## 2018-02-01 DIAGNOSIS — E1122 Type 2 diabetes mellitus with diabetic chronic kidney disease: Secondary | ICD-10-CM | POA: Diagnosis present

## 2018-02-01 DIAGNOSIS — Z7984 Long term (current) use of oral hypoglycemic drugs: Secondary | ICD-10-CM

## 2018-02-01 DIAGNOSIS — G8911 Acute pain due to trauma: Secondary | ICD-10-CM | POA: Diagnosis not present

## 2018-02-01 DIAGNOSIS — R2689 Other abnormalities of gait and mobility: Secondary | ICD-10-CM | POA: Diagnosis not present

## 2018-02-01 DIAGNOSIS — T148XXA Other injury of unspecified body region, initial encounter: Secondary | ICD-10-CM

## 2018-02-01 DIAGNOSIS — N183 Chronic kidney disease, stage 3 (moderate): Secondary | ICD-10-CM | POA: Diagnosis not present

## 2018-02-01 DIAGNOSIS — Z7983 Long term (current) use of bisphosphonates: Secondary | ICD-10-CM

## 2018-02-01 DIAGNOSIS — M25552 Pain in left hip: Secondary | ICD-10-CM | POA: Diagnosis not present

## 2018-02-01 DIAGNOSIS — S72332D Displaced oblique fracture of shaft of left femur, subsequent encounter for closed fracture with routine healing: Secondary | ICD-10-CM | POA: Diagnosis not present

## 2018-02-01 DIAGNOSIS — I10 Essential (primary) hypertension: Secondary | ICD-10-CM | POA: Diagnosis present

## 2018-02-01 DIAGNOSIS — Z7901 Long term (current) use of anticoagulants: Secondary | ICD-10-CM

## 2018-02-01 DIAGNOSIS — M329 Systemic lupus erythematosus, unspecified: Secondary | ICD-10-CM | POA: Diagnosis present

## 2018-02-01 DIAGNOSIS — S72492A Other fracture of lower end of left femur, initial encounter for closed fracture: Secondary | ICD-10-CM | POA: Diagnosis not present

## 2018-02-01 DIAGNOSIS — I130 Hypertensive heart and chronic kidney disease with heart failure and stage 1 through stage 4 chronic kidney disease, or unspecified chronic kidney disease: Secondary | ICD-10-CM | POA: Diagnosis not present

## 2018-02-01 DIAGNOSIS — Y92002 Bathroom of unspecified non-institutional (private) residence single-family (private) house as the place of occurrence of the external cause: Secondary | ICD-10-CM | POA: Diagnosis not present

## 2018-02-01 DIAGNOSIS — I69351 Hemiplegia and hemiparesis following cerebral infarction affecting right dominant side: Secondary | ICD-10-CM

## 2018-02-01 DIAGNOSIS — S72342D Displaced spiral fracture of shaft of left femur, subsequent encounter for closed fracture with routine healing: Secondary | ICD-10-CM | POA: Diagnosis not present

## 2018-02-01 DIAGNOSIS — S7292XA Unspecified fracture of left femur, initial encounter for closed fracture: Secondary | ICD-10-CM | POA: Diagnosis not present

## 2018-02-01 DIAGNOSIS — Z1231 Encounter for screening mammogram for malignant neoplasm of breast: Secondary | ICD-10-CM | POA: Diagnosis not present

## 2018-02-01 DIAGNOSIS — Z8673 Personal history of transient ischemic attack (TIA), and cerebral infarction without residual deficits: Secondary | ICD-10-CM | POA: Diagnosis not present

## 2018-02-01 DIAGNOSIS — I679 Cerebrovascular disease, unspecified: Secondary | ICD-10-CM | POA: Diagnosis not present

## 2018-02-01 DIAGNOSIS — S3993XA Unspecified injury of pelvis, initial encounter: Secondary | ICD-10-CM | POA: Diagnosis not present

## 2018-02-01 DIAGNOSIS — S8991XA Unspecified injury of right lower leg, initial encounter: Secondary | ICD-10-CM | POA: Diagnosis not present

## 2018-02-01 DIAGNOSIS — T466X5A Adverse effect of antihyperlipidemic and antiarteriosclerotic drugs, initial encounter: Secondary | ICD-10-CM | POA: Diagnosis not present

## 2018-02-01 DIAGNOSIS — K5900 Constipation, unspecified: Secondary | ICD-10-CM | POA: Diagnosis not present

## 2018-02-01 DIAGNOSIS — S728X2A Other fracture of left femur, initial encounter for closed fracture: Secondary | ICD-10-CM | POA: Diagnosis not present

## 2018-02-01 DIAGNOSIS — S72342A Displaced spiral fracture of shaft of left femur, initial encounter for closed fracture: Principal | ICD-10-CM

## 2018-02-01 DIAGNOSIS — R69 Illness, unspecified: Secondary | ICD-10-CM | POA: Diagnosis not present

## 2018-02-01 DIAGNOSIS — W19XXXD Unspecified fall, subsequent encounter: Secondary | ICD-10-CM | POA: Diagnosis not present

## 2018-02-01 DIAGNOSIS — S72452A Displaced supracondylar fracture without intracondylar extension of lower end of left femur, initial encounter for closed fracture: Secondary | ICD-10-CM | POA: Diagnosis not present

## 2018-02-01 DIAGNOSIS — Z79899 Other long term (current) drug therapy: Secondary | ICD-10-CM | POA: Diagnosis not present

## 2018-02-01 DIAGNOSIS — J9811 Atelectasis: Secondary | ICD-10-CM | POA: Diagnosis not present

## 2018-02-01 DIAGNOSIS — R262 Difficulty in walking, not elsewhere classified: Secondary | ICD-10-CM | POA: Diagnosis not present

## 2018-02-01 DIAGNOSIS — R2681 Unsteadiness on feet: Secondary | ICD-10-CM | POA: Diagnosis not present

## 2018-02-01 DIAGNOSIS — M810 Age-related osteoporosis without current pathological fracture: Secondary | ICD-10-CM | POA: Diagnosis not present

## 2018-02-01 DIAGNOSIS — M79605 Pain in left leg: Secondary | ICD-10-CM | POA: Diagnosis not present

## 2018-02-01 DIAGNOSIS — R41841 Cognitive communication deficit: Secondary | ICD-10-CM | POA: Diagnosis not present

## 2018-02-01 DIAGNOSIS — S72142D Displaced intertrochanteric fracture of left femur, subsequent encounter for closed fracture with routine healing: Secondary | ICD-10-CM | POA: Diagnosis not present

## 2018-02-01 DIAGNOSIS — D62 Acute posthemorrhagic anemia: Secondary | ICD-10-CM | POA: Diagnosis not present

## 2018-02-01 DIAGNOSIS — M6281 Muscle weakness (generalized): Secondary | ICD-10-CM | POA: Diagnosis not present

## 2018-02-01 DIAGNOSIS — Z01818 Encounter for other preprocedural examination: Secondary | ICD-10-CM

## 2018-02-01 HISTORY — DX: Type 2 diabetes mellitus without complications: E11.9

## 2018-02-01 HISTORY — DX: Essential (primary) hypertension: I10

## 2018-02-01 HISTORY — DX: Malignant (primary) neoplasm, unspecified: C80.1

## 2018-02-01 HISTORY — DX: Systemic lupus erythematosus, unspecified: M32.9

## 2018-02-01 HISTORY — DX: Cerebral infarction, unspecified: I63.9

## 2018-02-01 HISTORY — DX: Disorder of kidney and ureter, unspecified: N28.9

## 2018-02-01 HISTORY — DX: Immunodeficiency, unspecified: D84.9

## 2018-02-01 LAB — PROTIME-INR
INR: 2.07
Prothrombin Time: 23.1 seconds — ABNORMAL HIGH (ref 11.4–15.2)

## 2018-02-01 LAB — CBC WITH DIFFERENTIAL/PLATELET
BASOS PCT: 0 %
Basophils Absolute: 0 10*3/uL (ref 0.0–0.1)
EOS ABS: 0.1 10*3/uL (ref 0.0–0.7)
Eosinophils Relative: 1 %
HEMATOCRIT: 40 % (ref 36.0–46.0)
Hemoglobin: 13 g/dL (ref 12.0–15.0)
Lymphocytes Relative: 9 %
Lymphs Abs: 1.5 10*3/uL (ref 0.7–4.0)
MCH: 30.4 pg (ref 26.0–34.0)
MCHC: 32.5 g/dL (ref 30.0–36.0)
MCV: 93.5 fL (ref 78.0–100.0)
MONO ABS: 0.7 10*3/uL (ref 0.1–1.0)
MONOS PCT: 4 %
NEUTROS ABS: 14.7 10*3/uL — AB (ref 1.7–7.7)
Neutrophils Relative %: 86 %
Platelets: 206 10*3/uL (ref 150–400)
RBC: 4.28 MIL/uL (ref 3.87–5.11)
RDW: 14 % (ref 11.5–15.5)
WBC: 17 10*3/uL — ABNORMAL HIGH (ref 4.0–10.5)

## 2018-02-01 LAB — BASIC METABOLIC PANEL
Anion gap: 12 (ref 5–15)
BUN: 24 mg/dL — ABNORMAL HIGH (ref 6–20)
CALCIUM: 9.5 mg/dL (ref 8.9–10.3)
CO2: 22 mmol/L (ref 22–32)
Chloride: 106 mmol/L (ref 101–111)
Creatinine, Ser: 1.21 mg/dL — ABNORMAL HIGH (ref 0.44–1.00)
GFR calc non Af Amer: 45 mL/min — ABNORMAL LOW (ref >60)
GFR, EST AFRICAN AMERICAN: 52 mL/min — AB (ref >60)
GLUCOSE: 234 mg/dL — AB (ref 65–99)
Potassium: 4 mmol/L (ref 3.5–5.1)
Sodium: 140 mmol/L (ref 135–145)

## 2018-02-01 LAB — GLUCOSE, CAPILLARY: GLUCOSE-CAPILLARY: 214 mg/dL — AB (ref 65–99)

## 2018-02-01 LAB — APTT: aPTT: 49 seconds — ABNORMAL HIGH (ref 24–36)

## 2018-02-01 MED ORDER — ONDANSETRON HCL 4 MG/2ML IJ SOLN
4.0000 mg | Freq: Once | INTRAMUSCULAR | Status: AC | PRN
  Administered 2018-02-01: 4 mg via INTRAVENOUS
  Filled 2018-02-01: qty 2

## 2018-02-01 MED ORDER — ATORVASTATIN CALCIUM 20 MG PO TABS
20.0000 mg | ORAL_TABLET | Freq: Every day | ORAL | Status: DC
  Administered 2018-02-02 – 2018-02-09 (×8): 20 mg via ORAL
  Filled 2018-02-01 (×9): qty 1

## 2018-02-01 MED ORDER — ALENDRONATE SODIUM 70 MG PO TABS
70.0000 mg | ORAL_TABLET | Freq: Every day | ORAL | Status: DC
  Filled 2018-02-01: qty 1

## 2018-02-01 MED ORDER — HYDROCODONE-ACETAMINOPHEN 5-325 MG PO TABS: 1.0000 | ORAL_TABLET | Freq: Four times a day (QID) | ORAL | Status: DC | PRN

## 2018-02-01 MED ORDER — METHOCARBAMOL 1000 MG/10ML IJ SOLN
500.0000 mg | Freq: Four times a day (QID) | INTRAMUSCULAR | Status: DC | PRN
  Filled 2018-02-01: qty 5

## 2018-02-01 MED ORDER — INSULIN ASPART 100 UNIT/ML ~~LOC~~ SOLN
0.0000 [IU] | Freq: Three times a day (TID) | SUBCUTANEOUS | Status: DC
  Administered 2018-02-02 – 2018-02-03 (×3): 2 [IU] via SUBCUTANEOUS
  Administered 2018-02-03: 1 [IU] via SUBCUTANEOUS
  Administered 2018-02-04 – 2018-02-05 (×5): 2 [IU] via SUBCUTANEOUS
  Administered 2018-02-06: 3 [IU] via SUBCUTANEOUS
  Administered 2018-02-06: 1 [IU] via SUBCUTANEOUS
  Administered 2018-02-06: 3 [IU] via SUBCUTANEOUS
  Administered 2018-02-07: 1 [IU] via SUBCUTANEOUS
  Administered 2018-02-07: 3 [IU] via SUBCUTANEOUS
  Administered 2018-02-07 – 2018-02-08 (×2): 1 [IU] via SUBCUTANEOUS
  Administered 2018-02-08 – 2018-02-09 (×4): 2 [IU] via SUBCUTANEOUS
  Administered 2018-02-09: 1 [IU] via SUBCUTANEOUS

## 2018-02-01 MED ORDER — AMLODIPINE BESYLATE 10 MG PO TABS
10.0000 mg | ORAL_TABLET | Freq: Every day | ORAL | Status: DC
  Administered 2018-02-02 – 2018-02-09 (×8): 10 mg via ORAL
  Filled 2018-02-01 (×2): qty 1
  Filled 2018-02-01: qty 2
  Filled 2018-02-01 (×5): qty 1

## 2018-02-01 MED ORDER — MORPHINE SULFATE (PF) 4 MG/ML IV SOLN: 0.5000 mg | INTRAVENOUS | Status: DC | PRN

## 2018-02-01 MED ORDER — METOPROLOL TARTRATE 100 MG PO TABS
100.0000 mg | ORAL_TABLET | Freq: Two times a day (BID) | ORAL | Status: DC
  Administered 2018-02-01 – 2018-02-09 (×16): 100 mg via ORAL
  Filled 2018-02-01: qty 4
  Filled 2018-02-01 (×8): qty 1
  Filled 2018-02-01: qty 4
  Filled 2018-02-01 (×6): qty 1

## 2018-02-01 MED ORDER — METHOCARBAMOL 500 MG PO TABS
500.0000 mg | ORAL_TABLET | Freq: Four times a day (QID) | ORAL | Status: DC | PRN
  Administered 2018-02-04 – 2018-02-05 (×4): 500 mg via ORAL
  Filled 2018-02-01 (×5): qty 1

## 2018-02-01 MED ORDER — INSULIN ASPART 100 UNIT/ML ~~LOC~~ SOLN
0.0000 [IU] | Freq: Every day | SUBCUTANEOUS | Status: DC
  Administered 2018-02-01: 2 [IU] via SUBCUTANEOUS

## 2018-02-01 MED ORDER — SENNA 8.6 MG PO TABS
1.0000 | ORAL_TABLET | Freq: Every day | ORAL | Status: DC
  Administered 2018-02-02 – 2018-02-09 (×8): 8.6 mg via ORAL
  Filled 2018-02-01 (×8): qty 1

## 2018-02-01 MED ORDER — MORPHINE SULFATE (PF) 4 MG/ML IV SOLN
4.0000 mg | Freq: Once | INTRAVENOUS | Status: AC | PRN
  Administered 2018-02-01: 4 mg via INTRAVENOUS
  Filled 2018-02-01: qty 1

## 2018-02-01 MED ORDER — KETOCONAZOLE 2 % EX CREA
1.0000 "application " | TOPICAL_CREAM | Freq: Every day | CUTANEOUS | Status: DC
  Administered 2018-02-06 – 2018-02-09 (×4): 1 via TOPICAL
  Filled 2018-02-01 (×2): qty 15

## 2018-02-01 NOTE — ED Provider Notes (Signed)
[facility] EMERGENCY DEPARTMENT Provider Note   CSN: [id number] Arrival date & time: 02/01/18  1658     History   Chief Complaint Chief Complaint  Patient presents with  . Fall    HPI Rhonda Contreras is a 67 y.o. female.  The history is provided by the patient and medical records. No language interpreter was used.  Fall    Rhonda Contreras is a 67 y.o. female  with a PMH of as listed below who presents to the Emergency Department complaining of onset of left lower leg pain just prior to arrival.  Patient states that she was in the bathroom when she tripped over her slipper, causing her to fall.  She denies hitting her head.  No headache or neck pain.  No nausea or vomiting.  Immediately felt a pop to the left leg just below the knee with acute onset of pain.  Usually 9/10.  Given fentanyl in route which improved pain to 4/10.  Denies any open wounds.  No loss of consciousness.  Denies dizziness or lightheadedness prior to the fall.  Reports this was mechanical and that she just slipped. No numbness, tingling.  Past Medical History:  Diagnosis Date  . Cancer ([place])    skin  . Diabetes mellitus without complication ([place])   . Hypertension   . Immune deficiency disorder ([place])   . Lupus (systemic lupus erythematosus) ([place])   . Renal disorder    Stage 2 KD  . Stroke [facility])    2009    There are no active problems to display for this patient.    OB History   None      Home Medications    Prior to Admission medications   Medication Sig Start Date End Date Taking? Authorizing Provider  alendronate (FOSAMAX) 70 MG tablet Take 70 mg by mouth daily. 12/27/17  Yes [provider]  amLODipine (NORVASC) 10 MG tablet Take 10 mg by mouth daily. 12/29/17  Yes [provider]  atorvastatin (LIPITOR) 20 MG tablet Take 20 mg by mouth daily. 12/29/17  Yes [provider]  glimepiride (AMARYL) 1 MG tablet Take 1 mg by mouth daily. 12/29/17  Yes  [provider]  ketoconazole (NIZORAL) 2 % cream Apply 1 application topically daily. On her face 01/10/18  Yes [provider]  lisinopril-hydrochlorothiazide (PRINZIDE,ZESTORETIC) 10-12.5 MG tablet Take 1 tablet by mouth daily. 12/29/17  Yes [provider]  metFORMIN (GLUCOPHAGE) 1000 MG tablet Take 1,000 mg by mouth at bedtime.   Yes [provider]  metoprolol tartrate (LOPRESSOR) 100 MG tablet Take 100 mg by mouth 2 (two) times daily. 12/29/17  Yes [provider]  warfarin (COUMADIN) 5 MG tablet Take 5 mg by mouth daily at 6 PM. Sunday and Tuesday 5 mg  2.5 mg all the other day 12/26/17  Yes [provider]    Family History No family history on file.  Social History Social History   Tobacco Use  . Smoking status: Never Smoker  . Smokeless tobacco: Never Used  Substance Use Topics  . Alcohol use: Not Currently  . Drug use: Never     Allergies   Patient has no known allergies.   Review of Systems Review of Systems  Musculoskeletal: Positive for arthralgias and myalgias.  All other systems reviewed and are negative.    Physical Exam Updated Vital Signs BP 102/62   Pulse (!) 103   Temp 98.2 F (36.8 C) (Oral)  Resp 18   Ht 4\' 6"  (1.372 m)   Wt 59 kg (130 lb)   SpO2 (!) 89%   BMI 31.34 kg/m   Physical Exam  Constitutional: She is oriented to person, place, and time. She appears well-developed and well-nourished. No distress.  HENT:  Head: Normocephalic and atraumatic.  Neck:  No midline tenderness. Full ROM without pain.  Cardiovascular: Normal rate, regular rhythm and normal heart sounds.  No murmur heard. Pulmonary/Chest: Effort normal and breath sounds normal. No respiratory distress.  Abdominal: Soft. She exhibits no distension. There is no tenderness.  Musculoskeletal:  Tenderness to palpation of left distal thigh and knee. Decreased ROM. All compartments soft. No tenderness to the hip or L spine. No  tenderness to foot/ankle.   Neurological: She is alert and oriented to person, place, and time.  Bilateral lower extremities neurovascularly intact.  Skin: Skin is warm and dry. Capillary refill takes less than 2 seconds.  Nursing note and vitals reviewed.    ED Treatments / Results  Labs (all labs ordered are listed, but only abnormal results are displayed) Labs Reviewed  CBC WITH DIFFERENTIAL/PLATELET - Abnormal; Notable for the following components:      Result Value   WBC 17.0 (*)    Neutro Abs 14.7 (*)    All other components within normal limits  BASIC METABOLIC PANEL - Abnormal; Notable for the following components:   Glucose, Bld 234 (*)    BUN 24 (*)    Creatinine, Ser 1.21 (*)    GFR calc non Af Amer 45 (*)    GFR calc Af Amer 52 (*)    All other components within normal limits  PROTIME-INR - Abnormal; Notable for the following components:   Prothrombin Time 23.1 (*)    All other components within normal limits  APTT - Abnormal; Notable for the following components:   aPTT 49 (*)    All other components within normal limits    EKG None  Radiology Dg Chest 1 View  Result Date: 02/01/2018 CLINICAL DATA:  Preop chest for left femoral fracture. EXAM: CHEST  1 VIEW COMPARISON:  None. FINDINGS: Cardiomegaly with ectatic thoracic aorta. Atelectasis noted at each lung base. No pulmonary edema or focal pulmonary consolidation. Remote left-sided rib fractures. No acute osseous abnormality of the bony thorax. IMPRESSION: Cardiomegaly without active pulmonary disease. Electronically Signed   By: Ashley Royalty M.D.   On: 02/01/2018 18:59   Dg Pelvis 1-2 Views  Result Date: 02/01/2018 CLINICAL DATA:  Left hip pain after slip and fall in bathtub today. EXAM: PELVIS - 1-2 VIEW COMPARISON:  None. FINDINGS: There is no evidence of pelvic fracture or diastasis. Femoral heads are seated within their acetabular components. No pelvic bone lesions are seen. Left L5-S1 assimilation joint.  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 IMPRESSION: 1. No acute fracture of the pelvis. 2. 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 3. L5-S1 left-sided assimilation joint. Electronically Signed   By: Ashley Royalty M.D.   On: 02/01/2018 18:46   Dg Tibia/fibula Left  Result Date: 02/01/2018 CLINICAL DATA:  Left leg pain after slip in the bathroom today. EXAM: LEFT TIBIA AND FIBULA - 2 VIEW COMPARISON:  None. FINDINGS: Partially visualized femoral diaphyseal fracture with lateral displacement of the distal fracture fragment. Tibial arteriosclerosis is noted. Mild joint space narrowing of the femorotibial compartment. No joint dislocation at the knee nor ankle joint. Subtalar joint is maintained as are the midfoot articulations.  Soft tissues are unremarkable. IMPRESSION: Partially included femoral diaphyseal fracture with lateral displacement 1 shaft width of the distal fracture fragment. No tibial nor fibular fracture. Electronically Signed   By: Ashley Royalty M.D.   On: 02/01/2018 18:50   Dg Femur Min 2 Views Left  Result Date: 02/01/2018 CLINICAL DATA:  Left leg pain after fall in bathroom today. EXAM: LEFT FEMUR 2 VIEWS COMPARISON:  None. FINDINGS: There is an acute spiral oblique fracture of the left femoral diaphysis with 1 shaft width lateral and anterior displacement of the distal fracture fragment as well as 90 degrees of external rotation and 4 cm of foreshortening. No intra-articular extension is identified. No joint dislocation at the hip or knee. IMPRESSION: Acute spiral oblique fracture of the left femoral diaphysis with 1 shaft with anterior and lateral displacement of the distal fracture fragment with 90 degrees of external rotation and 4 cm of foreshortening/override. Electronically Signed   By: Ashley Royalty M.D.   On: 02/01/2018 18:53    Procedures Procedures (including critical care  time)  Medications Ordered in ED Medications  ondansetron (ZOFRAN) injection 4 mg (4 mg Intravenous Given 02/01/18 1951)  morphine 4 MG/ML injection 4 mg (4 mg Intravenous Given 02/01/18 1951)     Initial Impression / Assessment and Plan / ED Course  I have reviewed the triage vital signs and the nursing notes.  Pertinent labs & imaging results that were available during my care of the patient were reviewed by me and considered in my medical decision making (see chart for details).    Rhonda Contreras is a 67 y.o. female who presents to ED for duration after mechanical fall just prior to arrival.  Good distal pulses and cap refill.  Sensation intact.  X-ray does show acute spiral oblique fracture of the left femoral diaphysis which is displaced and rotated.  Discussed case with orthopedics, Dr. French Ana, who recommends medical admission and orthopedics will see patient likely in the morning. Hospitalist consulted who will admit.   Patient discussed with Dr. Tyrone Nine who agrees with treatment plan.     Final Clinical Impressions(s) / ED Diagnoses   Final diagnoses:  Closed displaced spiral fracture of shaft of left femur, initial encounter [facility])    ED Discharge Orders    None       Furious Chiarelli, Ozella Almond, PA-C 02/01/18 2044    Deno Etienne, DO 02/01/18 2300

## 2018-02-01 NOTE — ED Notes (Signed)
Warm blankets provided, pt shivering

## 2018-02-01 NOTE — H&P (Signed)
History and Physical    Rhonda Contreras LOV:[id number] DOB: [birth year] DOA: 02/01/2018  Referring MD/NP/PA: Amie Portland, PA-C PCP: Patient, No Pcp Per  Patient coming from: Home via EMS  Chief Complaint: Left leg pain  I have personally briefly reviewed patient's old medical records in [place]   HPI: Rhonda Contreras is a 67 y.o. female with medical history significant of HTN, lupus, DM type II, CVA on chronic anticoagulation, and osteoporosis; who presents after having a fall at home with left leg pain.  Patient provides her own history and at baseline patient lives alone. She was in the bathroom getting ready to go out when her slipper slipped from underneath her and she fell landing onto her left leg.  Denies any trauma to her head or loss of consciousness.  She was able to crawl to her cell phone to call for EMS.  Reports having severe pain in the left leg unable to bear weight.  Was unable to try anything to relieve pain symptoms.  Otherwise, patient notes being in her normal state of health and had recently been started on Fosamax for osteoporosis within the last month.  Her blood sugars are normally low and her last hemoglobin A1c was less than 7.  ED Course: Upon admission into the emergency department patient was noted to be afebrile, pulse 91-103, respirations 16-36, blood pressures maintained, and O2 saturations 89-99%.  Labs revealed WBC 17, BUN 24, creatinine 1.21, glucose 234, and INR 2.04.  X-ray imaging shows acute displaced spiral oblique fracture of the left femoral diaphysis.  Dr. French Ana of orthopedics was consulted and recommended medical admission for possible surgical correction in a.m.  Review of Systems  Constitutional: Negative for chills, fever and malaise/fatigue.  HENT: Negative for ear discharge and nosebleeds.   Eyes: Negative for double vision and photophobia.  Respiratory: Negative for cough and shortness of breath.   Cardiovascular: Negative for chest  pain, orthopnea and leg swelling.  Gastrointestinal: Negative for diarrhea and vomiting.  Genitourinary: Negative for dysuria, frequency and hematuria.  Musculoskeletal: Positive for falls and joint pain.  Skin: Negative for itching and rash.  Neurological: Negative for focal weakness, loss of consciousness and weakness.  Endo/Heme/Allergies: Bruises/bleeds easily.  Psychiatric/Behavioral: Negative for hallucinations. The patient is not nervous/anxious.     Past Medical History:  Diagnosis Date  . Cancer ([place])    skin  . Diabetes mellitus without complication ([place])   . Hypertension   . Immune deficiency disorder ([place])   . Lupus (systemic lupus erythematosus) ([place])   . Renal disorder    Stage 2 KD  . Stroke [facility])    2009    Past Surgical History:  Procedure Laterality Date  . TONSILLECTOMY       reports that she has never smoked. She has never used smokeless tobacco. She reports that she drank alcohol. She reports that she does not use drugs.  No Known Allergies  Family History  Problem Relation Age of Onset  . Heart attack Mother     Prior to Admission medications   Medication Sig Start Date End Date Taking? Authorizing Provider  alendronate (FOSAMAX) 70 MG tablet Take 70 mg by mouth daily. 12/27/17  Yes [provider]  amLODipine (NORVASC) 10 MG tablet Take 10 mg by mouth daily. 12/29/17  Yes [provider]  atorvastatin (LIPITOR) 20 MG tablet Take 20 mg by mouth daily. 12/29/17  Yes [provider]  glimepiride (AMARYL) 1 MG tablet Take 1 mg  by mouth daily. 12/29/17  Yes [provider]  ketoconazole (NIZORAL) 2 % cream Apply 1 application topically daily. On her face 01/10/18  Yes [provider]  lisinopril-hydrochlorothiazide (PRINZIDE,ZESTORETIC) 10-12.5 MG tablet Take 1 tablet by mouth daily. 12/29/17  Yes [provider]  metFORMIN (GLUCOPHAGE) 1000 MG tablet Take 1,000 mg by mouth at bedtime.   Yes [provider]  metoprolol tartrate (LOPRESSOR) 100 MG tablet Take 100 mg by mouth 2 (two) times daily. 12/29/17  Yes [provider]  warfarin (COUMADIN) 5 MG tablet Take 5 mg by mouth daily at 6 PM. Sunday and Tuesday 5 mg  2.5 mg all the other day 12/26/17  Yes [provider]    Physical Exam:  Constitutional: Elderly female who appears to be in NAD, calm, comfortable Vitals:   02/01/18 1745 02/01/18 1815 02/01/18 1845 02/01/18 1915  BP: 120/60 106/83 131/72 102/62  Pulse: 94 92 (!) 101 (!) 103  Resp: (!) 23 (!) 22 (!) 22 18  Temp:      TempSrc:      SpO2: 96% 94% 94% (!) 89%  Weight:      Height:       Eyes: PERRL, lids and conjunctivae normal ENMT: Mucous membranes are moist. Posterior pharynx clear of any exudate or lesions. Normal dentition.  Neck: normal, supple, no masses, no thyromegaly Respiratory: clear to auscultation bilaterally, no wheezing, no crackles. Normal respiratory effort. No accessory muscle use.  Cardiovascular: Regular rate and rhythm, no murmurs / rubs / gallops. No extremity edema. 2+ pedal pulses. No carotid bruits.  Abdomen: no tenderness, no masses palpated. No hepatosplenomegaly. Bowel sounds positive.  Musculoskeletal: no clubbing / cyanosis.  Left leg is externally rotated and shortened with.  Hammertoe present of the right foot. Skin: no rashes, lesions, ulcers. No induration Neurologic: CN 2-12 grossly intact. Sensation intact, DTR normal. Strength 5/5 in all 4.  Psychiatric: Normal judgment and insight. Alert and oriented x 3. Normal mood.     Labs on Admission: I have personally reviewed following labs and imaging studies  CBC: Recent Labs  Lab 02/01/18 1733  WBC 17.0*  NEUTROABS 14.7*  HGB 13.0  HCT 40.0  MCV 93.5  PLT 465   Basic Metabolic Panel: Recent Labs  Lab 02/01/18 1733  NA 140  K 4.0  CL 106  CO2 22  GLUCOSE 234*  BUN 24*  CREATININE 1.21*  CALCIUM 9.5   GFR: Estimated Creatinine Clearance: 29.9  mL/min (A) (by C-G formula based on SCr of 1.21 mg/dL (H)). Liver Function Tests: No results for input(s): AST, ALT, ALKPHOS, BILITOT, PROT, ALBUMIN in the last 168 hours. No results for input(s): LIPASE, AMYLASE in the last 168 hours. No results for input(s): AMMONIA in the last 168 hours. Coagulation Profile: Recent Labs  Lab 02/01/18 1733  INR 2.07   Cardiac Enzymes: No results for input(s): CKTOTAL, CKMB, CKMBINDEX, TROPONINI in the last 168 hours. BNP (last 3 results) No results for input(s): PROBNP in the last 8760 hours. HbA1C: No results for input(s): HGBA1C in the last 72 hours. CBG: No results for input(s): GLUCAP in the last 168 hours. Lipid Profile: No results for input(s): CHOL, HDL, LDLCALC, TRIG, CHOLHDL, LDLDIRECT in the last 72 hours. Thyroid Function Tests: No results for input(s): TSH, T4TOTAL, FREET4, T3FREE, THYROIDAB in the last 72 hours. Anemia Panel: No results for input(s): VITAMINB12, FOLATE, FERRITIN, TIBC, IRON, RETICCTPCT in the last 72 hours. Urine analysis:    Component Value Date/Time  COLORURINE YELLOW 05/16/2008 0330   APPEARANCEUR CLEAR 05/16/2008 0330   LABSPEC 1.013 05/16/2008 0330   PHURINE 6.0 05/16/2008 0330   GLUCOSEU 500 (A) 05/16/2008 0330   HGBUR NEGATIVE 05/16/2008 0330   BILIRUBINUR NEGATIVE 05/16/2008 0330   KETONESUR 15 (A) 05/16/2008 0330   PROTEINUR NEGATIVE 05/16/2008 0330   UROBILINOGEN 1.0 05/16/2008 0330   NITRITE NEGATIVE 05/16/2008 0330   LEUKOCYTESUR NEGATIVE 05/16/2008 0330   Sepsis Labs: No results found for this or any previous visit (from the past 240 hour(s)).   Radiological Exams on Admission: Dg Chest 1 View  Result Date: 02/01/2018 CLINICAL DATA:  Preop chest for left femoral fracture. EXAM: CHEST  1 VIEW COMPARISON:  None. FINDINGS: Cardiomegaly with ectatic thoracic aorta. Atelectasis noted at each lung base. No pulmonary edema or focal pulmonary consolidation. Remote left-sided rib fractures. No acute  osseous abnormality of the bony thorax. IMPRESSION: Cardiomegaly without active pulmonary disease. Electronically Signed   By: Ashley Royalty M.D.   On: 02/01/2018 18:59   Dg Pelvis 1-2 Views  Result Date: 02/01/2018 CLINICAL DATA:  Left hip pain after slip and fall in bathtub today. EXAM: PELVIS - 1-2 VIEW COMPARISON:  None. FINDINGS: There is no evidence of pelvic fracture or diastasis. Femoral heads are seated within their acetabular components. No pelvic bone lesions are seen. Left L5-S1 assimilation joint. 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 IMPRESSION: 1. No acute fracture of the pelvis. 2. 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 3. L5-S1 left-sided assimilation joint. Electronically Signed   By: Ashley Royalty M.D.   On: 02/01/2018 18:46   Dg Tibia/fibula Left  Result Date: 02/01/2018 CLINICAL DATA:  Left leg pain after slip in the bathroom today. EXAM: LEFT TIBIA AND FIBULA - 2 VIEW COMPARISON:  None. FINDINGS: Partially visualized femoral diaphyseal fracture with lateral displacement of the distal fracture fragment. Tibial arteriosclerosis is noted. Mild joint space narrowing of the femorotibial compartment. No joint dislocation at the knee nor ankle joint. Subtalar joint is maintained as are the midfoot articulations. Soft tissues are unremarkable. IMPRESSION: Partially included femoral diaphyseal fracture with lateral displacement 1 shaft width of the distal fracture fragment. No tibial nor fibular fracture. Electronically Signed   By: Ashley Royalty M.D.   On: 02/01/2018 18:50   Dg Femur Min 2 Views Left  Result Date: 02/01/2018 CLINICAL DATA:  Left leg pain after fall in bathroom today. EXAM: LEFT FEMUR 2 VIEWS COMPARISON:  None. FINDINGS: There is an acute spiral oblique fracture of the left femoral diaphysis with 1 shaft width lateral and anterior displacement of the distal fracture fragment as  well as 90 degrees of external rotation and 4 cm of foreshortening. No intra-articular extension is identified. No joint dislocation at the hip or knee. IMPRESSION: Acute spiral oblique fracture of the left femoral diaphysis with 1 shaft with anterior and lateral displacement of the distal fracture fragment with 90 degrees of external rotation and 4 cm of foreshortening/override. Electronically Signed   By: Ashley Royalty M.D.   On: 02/01/2018 18:53    X-rays of left femur: Independently reviewed.  Showing displaced fracture of the left femoral diaphysis  Assessment/Plan Fall with Left femur diaphysis fracture: Acute.  Patient appears to have had a mechanical fall leading to acutedisplaced spiral oblique fracture of the left femoral diaphysis.  Dr. French Ana of orthopedics consulted who previously wrist factors - Admit to MedSurg bed - Hip fracture order set initiated  - Hydrocodone/morphine IV prn  moderate to severe pain - Scheduled Senokot - Appreciate orthopedics date of services, follow-up further recommendation  Leukocytosis: Acute.  WBC elevated at 17. Suspect secondary to acute break. - Continue to monitor  Diabetes mellitus type 2: Well controlled on oral medications.  Initial glucose elevated to 234 likely secondary to acute stress. - Hypoglycemic protocol - Hold Amaryl and metformin - CBGs q. before meals and at bedtime with sensitive SSI  CVA on chronic anticoagulation: Patient reports residual mild right-sided weakness. INR noted to be 2.06 on admission. - Hold Coumadin - Recheck PT/INR in a.m.  Essential hypertension - Restart lisinopril/hydrochlorothiazide following procedure - Continue amlodipine and metoprolol   Chronic kidney disease stage III: Recent baseline kidney function unknown, but patient presents with a creatinine of 1.21 and BUN 24. - Continue to monitor kidney point   HFpEF: Last echocardiogram noted EF of 55-65% noted back in 2009.  Chest x-ray showing  cardiomegaly without signs of edema or infiltrate. - Strict I&O's and daily weights - Holding IV fluids overnight to avoid fluid overload  DVT prophylaxis: Scd   Code Status: Full Family Communication: Discussed the plan of care with the patient family present at bedside Disposition Plan: Likely need of rehab once medically stable Consults called: Orthopedics Admission status: Inpatient  Norval Morton MD [facility] Pager [phone]   If 7PM-7AM, please contact night-coverage [email or web address] Password [email or web address]  02/01/2018, 8:30 PM

## 2018-02-01 NOTE — ED Notes (Signed)
Admitting provider at bedside.

## 2018-02-01 NOTE — ED Triage Notes (Signed)
Pt reports slipping in the bathroom and injuring her left lower leg.  Pt is unclear how she fell "it happened so fast".  Pt has positive deformity of left lower leg.  CMS intact.  VSS.  Splint in place

## 2018-02-01 NOTE — ED Notes (Signed)
ED Provider at bedside. 

## 2018-02-01 NOTE — ED Notes (Signed)
Provider at bedside to update on POC

## 2018-02-02 ENCOUNTER — Inpatient Hospital Stay (HOSPITAL_COMMUNITY): Payer: Medicare HMO

## 2018-02-02 ENCOUNTER — Encounter (HOSPITAL_COMMUNITY)
Admission: EM | Disposition: A | Discharge status: Skilled Nursing Facility | Payer: Self-pay | Source: Home / Self Care | Attending: Internal Medicine

## 2018-02-02 ENCOUNTER — Encounter (HOSPITAL_COMMUNITY): Payer: Self-pay | Admitting: Certified Registered Nurse Anesthetist

## 2018-02-02 ENCOUNTER — Inpatient Hospital Stay (HOSPITAL_COMMUNITY): Payer: Medicare HMO | Admitting: Anesthesiology

## 2018-02-02 DIAGNOSIS — Z7901 Long term (current) use of anticoagulants: Secondary | ICD-10-CM

## 2018-02-02 DIAGNOSIS — S72332D Displaced oblique fracture of shaft of left femur, subsequent encounter for closed fracture with routine healing: Secondary | ICD-10-CM

## 2018-02-02 DIAGNOSIS — E119 Type 2 diabetes mellitus without complications: Secondary | ICD-10-CM

## 2018-02-02 DIAGNOSIS — Z8673 Personal history of transient ischemic attack (TIA), and cerebral infarction without residual deficits: Secondary | ICD-10-CM

## 2018-02-02 DIAGNOSIS — I10 Essential (primary) hypertension: Secondary | ICD-10-CM | POA: Diagnosis present

## 2018-02-02 HISTORY — PX: FEMUR IM NAIL: SHX1597

## 2018-02-02 LAB — APTT: aPTT: 55 seconds — ABNORMAL HIGH (ref 24–36)

## 2018-02-02 LAB — CBC
HEMATOCRIT: 35.2 % — AB (ref 36.0–46.0)
Hemoglobin: 11.3 g/dL — ABNORMAL LOW (ref 12.0–15.0)
MCH: 30.1 pg (ref 26.0–34.0)
MCHC: 32.1 g/dL (ref 30.0–36.0)
MCV: 93.6 fL (ref 78.0–100.0)
Platelets: 190 10*3/uL (ref 150–400)
RBC: 3.76 MIL/uL — ABNORMAL LOW (ref 3.87–5.11)
RDW: 14.1 % (ref 11.5–15.5)
WBC: 8.9 10*3/uL (ref 4.0–10.5)

## 2018-02-02 LAB — SURGICAL PCR SCREEN
MRSA, PCR: NEGATIVE
Staphylococcus aureus: NEGATIVE

## 2018-02-02 LAB — GLUCOSE, CAPILLARY
GLUCOSE-CAPILLARY: 139 mg/dL — AB (ref 65–99)
GLUCOSE-CAPILLARY: 150 mg/dL — AB (ref 65–99)
GLUCOSE-CAPILLARY: 149 mg/dL — AB (ref 65–99)
Glucose-Capillary: 180 mg/dL — ABNORMAL HIGH (ref 65–99)
Glucose-Capillary: 180 mg/dL — ABNORMAL HIGH (ref 65–99)

## 2018-02-02 LAB — PROTIME-INR
INR: 1.91
Prothrombin Time: 21.7 seconds — ABNORMAL HIGH (ref 11.4–15.2)

## 2018-02-02 LAB — CREATININE, SERUM
Creatinine, Ser: 0.97 mg/dL (ref 0.44–1.00)
GFR calc Af Amer: >60 mL/min (ref >60)
GFR calc non Af Amer: 59 mL/min — ABNORMAL LOW (ref >60)

## 2018-02-02 LAB — TYPE AND SCREEN
ABO/RH(D): A POS
Antibody Screen: NEGATIVE

## 2018-02-02 LAB — ABO/RH: ABO/RH(D): A POS

## 2018-02-02 SURGERY — INSERTION, INTRAMEDULLARY ROD, FEMUR
Anesthesia: General | Site: Leg Upper | Laterality: Left

## 2018-02-02 MED ORDER — ONDANSETRON HCL 4 MG/2ML IJ SOLN: 4.0000 mg | Freq: Four times a day (QID) | INTRAMUSCULAR | Status: DC | PRN

## 2018-02-02 MED ORDER — PROPOFOL 10 MG/ML IV BOLUS
INTRAVENOUS | Status: AC
  Filled 2018-02-02: qty 20

## 2018-02-02 MED ORDER — CEFAZOLIN SODIUM-DEXTROSE 2-4 GM/100ML-% IV SOLN
2.0000 g | Freq: Four times a day (QID) | INTRAVENOUS | Status: AC
  Administered 2018-02-02 – 2018-02-03 (×2): 2 g via INTRAVENOUS
  Filled 2018-02-02 (×2): qty 100

## 2018-02-02 MED ORDER — METOCLOPRAMIDE HCL 5 MG PO TABS: 5.0000 mg | ORAL_TABLET | Freq: Three times a day (TID) | ORAL | Status: DC | PRN

## 2018-02-02 MED ORDER — PHENYLEPHRINE HCL 10 MG/ML IJ SOLN
INTRAVENOUS | Status: DC | PRN
  Administered 2018-02-02: 25 ug/min via INTRAVENOUS

## 2018-02-02 MED ORDER — MORPHINE SULFATE (PF) 2 MG/ML IV SOLN: 0.5000 mg | INTRAVENOUS | Status: DC | PRN

## 2018-02-02 MED ORDER — LACTATED RINGERS IV SOLN: INTRAVENOUS | Status: DC

## 2018-02-02 MED ORDER — POLYETHYLENE GLYCOL 3350 17 G PO PACK: 17.0000 g | PACK | Freq: Every day | ORAL | Status: DC | PRN

## 2018-02-02 MED ORDER — SUGAMMADEX SODIUM 200 MG/2ML IV SOLN
INTRAVENOUS | Status: DC | PRN
  Administered 2018-02-02: 118 mg via INTRAVENOUS

## 2018-02-02 MED ORDER — LIDOCAINE HCL (CARDIAC) 20 MG/ML IV SOLN
INTRAVENOUS | Status: DC | PRN
  Administered 2018-02-02: 80 mg via INTRAVENOUS

## 2018-02-02 MED ORDER — ONDANSETRON HCL 4 MG PO TABS: 4.0000 mg | ORAL_TABLET | Freq: Four times a day (QID) | ORAL | Status: DC | PRN

## 2018-02-02 MED ORDER — ONDANSETRON HCL 4 MG/2ML IJ SOLN
INTRAMUSCULAR | Status: AC
  Filled 2018-02-02: qty 2

## 2018-02-02 MED ORDER — FENTANYL CITRATE (PF) 100 MCG/2ML IJ SOLN
INTRAMUSCULAR | Status: DC | PRN
  Administered 2018-02-02: 100 ug via INTRAVENOUS
  Administered 2018-02-02: 50 ug via INTRAVENOUS

## 2018-02-02 MED ORDER — MIDAZOLAM HCL 2 MG/2ML IJ SOLN
INTRAMUSCULAR | Status: AC
  Filled 2018-02-02: qty 2

## 2018-02-02 MED ORDER — FLEET ENEMA 7-19 GM/118ML RE ENEM: 1.0000 | ENEMA | Freq: Once | RECTAL | Status: DC | PRN

## 2018-02-02 MED ORDER — PHENYLEPHRINE 40 MCG/ML (10ML) SYRINGE FOR IV PUSH (FOR BLOOD PRESSURE SUPPORT)
PREFILLED_SYRINGE | INTRAVENOUS | Status: AC
  Filled 2018-02-02: qty 10

## 2018-02-02 MED ORDER — FENTANYL CITRATE (PF) 250 MCG/5ML IJ SOLN
INTRAMUSCULAR | Status: AC
  Filled 2018-02-02: qty 5

## 2018-02-02 MED ORDER — CEFAZOLIN SODIUM-DEXTROSE 2-4 GM/100ML-% IV SOLN
INTRAVENOUS | Status: AC
  Filled 2018-02-02: qty 100

## 2018-02-02 MED ORDER — DOCUSATE SODIUM 100 MG PO CAPS: 100.0000 mg | ORAL_CAPSULE | Freq: Two times a day (BID) | ORAL | 0 refills | Status: DC

## 2018-02-02 MED ORDER — DOCUSATE SODIUM 100 MG PO CAPS
100.0000 mg | ORAL_CAPSULE | Freq: Two times a day (BID) | ORAL | Status: DC
  Administered 2018-02-02 – 2018-02-09 (×11): 100 mg via ORAL
  Filled 2018-02-02 (×12): qty 1

## 2018-02-02 MED ORDER — HYDROMORPHONE HCL 1 MG/ML IJ SOLN: 0.2500 mg | INTRAMUSCULAR | Status: DC | PRN

## 2018-02-02 MED ORDER — ENOXAPARIN SODIUM 30 MG/0.3ML ~~LOC~~ SOLN
30.0000 mg | SUBCUTANEOUS | Status: DC
  Administered 2018-02-03 – 2018-02-06 (×3): 30 mg via SUBCUTANEOUS
  Filled 2018-02-02 (×4): qty 0.3

## 2018-02-02 MED ORDER — BUPIVACAINE HCL 0.25 % IJ SOLN
INTRAMUSCULAR | Status: DC | PRN
  Administered 2018-02-02: 30 mL

## 2018-02-02 MED ORDER — CEFAZOLIN SODIUM-DEXTROSE 2-3 GM-%(50ML) IV SOLR
INTRAVENOUS | Status: DC | PRN
  Administered 2018-02-02: 2 g via INTRAVENOUS

## 2018-02-02 MED ORDER — SODIUM CHLORIDE 0.9 % IV SOLN
1000.0000 mg | INTRAVENOUS | Status: AC
Start: 2018-02-02 — End: 2018-02-02
  Administered 2018-02-02: 1000 mg via INTRAVENOUS
  Filled 2018-02-02: qty 1100

## 2018-02-02 MED ORDER — METOCLOPRAMIDE HCL 5 MG/ML IJ SOLN: 5.0000 mg | Freq: Three times a day (TID) | INTRAMUSCULAR | Status: DC | PRN

## 2018-02-02 MED ORDER — KETOROLAC TROMETHAMINE 15 MG/ML IJ SOLN
7.5000 mg | Freq: Four times a day (QID) | INTRAMUSCULAR | Status: AC
  Administered 2018-02-02 – 2018-02-03 (×4): 7.5 mg via INTRAVENOUS
  Filled 2018-02-02 (×5): qty 1

## 2018-02-02 MED ORDER — ROCURONIUM BROMIDE 100 MG/10ML IV SOLN
INTRAVENOUS | Status: DC | PRN
  Administered 2018-02-02: 40 mg via INTRAVENOUS

## 2018-02-02 MED ORDER — PROPOFOL 10 MG/ML IV BOLUS
INTRAVENOUS | Status: DC | PRN
  Administered 2018-02-02: 100 mg via INTRAVENOUS

## 2018-02-02 MED ORDER — 0.9 % SODIUM CHLORIDE (POUR BTL) OPTIME
TOPICAL | Status: DC | PRN
  Administered 2018-02-02: 1000 mL

## 2018-02-02 MED ORDER — BISACODYL 10 MG RE SUPP: 10.0000 mg | Freq: Every day | RECTAL | Status: DC | PRN

## 2018-02-02 MED ORDER — SUGAMMADEX SODIUM 200 MG/2ML IV SOLN
INTRAVENOUS | Status: AC
  Filled 2018-02-02: qty 2

## 2018-02-02 MED ORDER — HYDROCODONE-ACETAMINOPHEN 5-325 MG PO TABS: 1.0000 | ORAL_TABLET | Freq: Four times a day (QID) | ORAL | 0 refills | Status: DC | PRN

## 2018-02-02 MED ORDER — HYDROCODONE-ACETAMINOPHEN 5-325 MG PO TABS
1.0000 | ORAL_TABLET | ORAL | Status: DC | PRN
  Administered 2018-02-02 – 2018-02-04 (×3): 2 via ORAL
  Administered 2018-02-04: 1 via ORAL
  Administered 2018-02-04 – 2018-02-05 (×4): 2 via ORAL
  Filled 2018-02-02 (×6): qty 2
  Filled 2018-02-02: qty 1
  Filled 2018-02-02 (×3): qty 2

## 2018-02-02 MED ORDER — ROCURONIUM BROMIDE 10 MG/ML (PF) SYRINGE
PREFILLED_SYRINGE | INTRAVENOUS | Status: AC
  Filled 2018-02-02: qty 5

## 2018-02-02 MED ORDER — LIDOCAINE 2% (20 MG/ML) 5 ML SYRINGE
INTRAMUSCULAR | Status: AC
  Filled 2018-02-02: qty 5

## 2018-02-02 MED ORDER — BUPIVACAINE HCL (PF) 0.25 % IJ SOLN
INTRAMUSCULAR | Status: AC
  Filled 2018-02-02: qty 30

## 2018-02-02 MED ORDER — PROMETHAZINE HCL 25 MG/ML IJ SOLN: 6.2500 mg | INTRAMUSCULAR | Status: DC | PRN

## 2018-02-02 MED ORDER — ACETAMINOPHEN 325 MG PO TABS: 325.0000 mg | ORAL_TABLET | Freq: Four times a day (QID) | ORAL | Status: DC | PRN

## 2018-02-02 MED ORDER — PHENYLEPHRINE HCL 10 MG/ML IJ SOLN
INTRAMUSCULAR | Status: DC | PRN
  Administered 2018-02-02: 80 ug via INTRAVENOUS
  Administered 2018-02-02: 40 ug via INTRAVENOUS

## 2018-02-02 MED ORDER — LACTATED RINGERS IV SOLN
INTRAVENOUS | Status: DC
  Administered 2018-02-02 (×2): via INTRAVENOUS

## 2018-02-02 SURGICAL SUPPLY — 54 items
BIT DRILL AO GAMMA 4.2X130 (BIT) ×1 IMPLANT
BIT DRILL AO GAMMA 4.2X340 (BIT) ×1 IMPLANT
BNDG CMPR MED 10X6 ELC LF (GAUZE/BANDAGES/DRESSINGS) ×1
BNDG COHESIVE 6X5 TAN STRL LF (GAUZE/BANDAGES/DRESSINGS) ×1 IMPLANT
BNDG ELASTIC 6X10 VLCR STRL LF (GAUZE/BANDAGES/DRESSINGS) ×1 IMPLANT
CLSR STERI-STRIP ANTIMIC 1/2X4 (GAUZE/BANDAGES/DRESSINGS) ×1 IMPLANT
COVER SURGICAL LIGHT HANDLE (MISCELLANEOUS) ×2 IMPLANT
DRAPE C-ARM 42X72 X-RAY (DRAPES) ×1 IMPLANT
DRAPE HALF SHEET 40X57 (DRAPES) ×1 IMPLANT
DRAPE IMP U-DRAPE 54X76 (DRAPES) ×1 IMPLANT
DRAPE ORTHO SPLIT 77X108 STRL (DRAPES) ×4
DRAPE SURG ORHT 6 SPLT 77X108 (DRAPES) IMPLANT
DRSG ADAPTIC 3X8 NADH LF (GAUZE/BANDAGES/DRESSINGS) ×1 IMPLANT
DRSG MEPILEX BORDER 4X4 (GAUZE/BANDAGES/DRESSINGS) ×3 IMPLANT
DURAPREP 26ML APPLICATOR (WOUND CARE) ×2 IMPLANT
ELECT REM PT RETURN 9FT ADLT (ELECTROSURGICAL) ×2
ELECTRODE REM PT RTRN 9FT ADLT (ELECTROSURGICAL) ×1 IMPLANT
END CAP (Cap) ×1 IMPLANT
GAUZE SPONGE 4X4 12PLY STRL (GAUZE/BANDAGES/DRESSINGS) ×1 IMPLANT
GLOVE BIO SURGEON STRL SZ7.5 (GLOVE) ×5 IMPLANT
GLOVE BIOGEL PI IND STRL 8 (GLOVE) ×2 IMPLANT
GLOVE BIOGEL PI INDICATOR 8 (GLOVE) ×2
GOWN STRL REUS W/ TWL LRG LVL3 (GOWN DISPOSABLE) ×3 IMPLANT
GOWN STRL REUS W/TWL LRG LVL3 (GOWN DISPOSABLE) ×6
GUIDEROD T2 3X1000 (ROD) ×1 IMPLANT
GUIDEWIRE GAMMA (WIRE) ×1 IMPLANT
KIT BASIN OR (CUSTOM PROCEDURE TRAY) ×2 IMPLANT
KIT TURNOVER KIT B (KITS) ×2 IMPLANT
MANIFOLD NEPTUNE II (INSTRUMENTS) ×2 IMPLANT
NAIL LOCK CANN LONG 10X320 TI (Nail) ×1 IMPLANT
NS IRRIG 1000ML POUR BTL (IV SOLUTION) ×2 IMPLANT
PACK GENERAL/GYN (CUSTOM PROCEDURE TRAY) ×2 IMPLANT
PAD ABD 8X10 STRL (GAUZE/BANDAGES/DRESSINGS) ×2 IMPLANT
PAD ARMBOARD 7.5X6 YLW CONV (MISCELLANEOUS) ×3 IMPLANT
PADDING CAST COTTON 6X4 STRL (CAST SUPPLIES) ×1 IMPLANT
REAMER SHAFT BIXCUT (INSTRUMENTS) ×1 IMPLANT
SCREW LOCK 5X75 FT T2 (Screw) ×1 IMPLANT
SCREW LOCKING T2 F/T  5MMX50MM (Screw) ×1 IMPLANT
SCREW LOCKING T2 F/T  5MMX65MM (Screw) ×1 IMPLANT
SCREW LOCKING T2 F/T  5X37.5MM (Screw) ×1 IMPLANT
SCREW LOCKING T2 F/T 5MMX50MM (Screw) IMPLANT
SCREW LOCKING T2 F/T 5MMX65MM (Screw) IMPLANT
SCREW LOCKING T2 F/T 5X37.5MM (Screw) IMPLANT
STOCKINETTE IMPERVIOUS 9X36 MD (GAUZE/BANDAGES/DRESSINGS) ×1 IMPLANT
SUT ETHILON 3 0 PS 1 (SUTURE) ×3 IMPLANT
SUT MNCRL AB 4-0 PS2 18 (SUTURE) IMPLANT
SUT MON AB 2-0 CT1 36 (SUTURE) IMPLANT
SUT VIC AB 0 CT1 27 (SUTURE) ×4
SUT VIC AB 0 CT1 27XBRD ANBCTR (SUTURE) ×1 IMPLANT
SUT VIC AB 2-0 CT1 27 (SUTURE) ×2
SUT VIC AB 2-0 CT1 TAPERPNT 27 (SUTURE) IMPLANT
TOWEL OR 17X24 6PK STRL BLUE (TOWEL DISPOSABLE) ×2 IMPLANT
TOWEL OR 17X26 10 PK STRL BLUE (TOWEL DISPOSABLE) ×2 IMPLANT
WATER STERILE IRR 1000ML POUR (IV SOLUTION) ×1 IMPLANT

## 2018-02-02 NOTE — H&P (View-Only) (Signed)
Reason for Consult: Left femur fracture Referring Physician: Christell Faith Ward PA-C, [facility]  Rhonda Contreras is an 67 y.o. female.  HPI: Rhonda Contreras is a 67 y.o. female with medical history significant of HTN, lupus, DM type II, CVA on chronic anticoagulation, and osteoporosis; who presents after having a fall at home with left leg pain.  Patient provides her own history and at baseline patient lives alone. She was in the bathroom getting ready to go out when her slipper slipped from underneath her and she fell landing onto her left leg.  Denies any trauma to her head or loss of consciousness.  She was able to crawl to her cell phone to call for EMS.  Reports having severe pain in the left leg unable to bear weight.  Was unable to try anything to relieve pain symptoms.  Otherwise, patient notes being in her normal state of health and had recently been started on Fosamax for osteoporosis within the last month.  Her blood sugars are normally low and her last hemoglobin A1c was less than 7.   X-ray imaging shows acute displaced spiral oblique fracture of the left femoral diaphysis.   Ortho was consulted and patient was admitted overnight by medicine service using hip fracture protocol and make NPO after midnight.      Past Medical History:  Diagnosis Date  . Cancer ([place])    skin  . Diabetes mellitus without complication ([place])   . Hypertension   . Immune deficiency disorder ([place])   . Lupus (systemic lupus erythematosus) ([place])   . Renal disorder    Stage 2 KD  . Stroke [facility])    2009    Past Surgical History:  Procedure Laterality Date  . TONSILLECTOMY      Family History  Problem Relation Age of Onset  . Heart attack Mother     Social History:  reports that she has never smoked. She has never used smokeless tobacco. She reports that she drank alcohol. She reports that she does not use drugs.  Allergies: No Known Allergies  Medications: I have reviewed the patient's current  medications.  Results for orders placed or performed during the hospital encounter of 02/01/18 (from the past 48 hour(s))  CBC with Differential     Status: Abnormal   Collection Time: 02/01/18  5:33 PM  Result Value Ref Range   WBC 17.0 (H) 4.0 - 10.5 K/uL   RBC 4.28 3.87 - 5.11 MIL/uL   Hemoglobin 13.0 12.0 - 15.0 g/dL   HCT 40.0 36.0 - 46.0 %   MCV 93.5 78.0 - 100.0 fL   MCH 30.4 26.0 - 34.0 pg   MCHC 32.5 30.0 - 36.0 g/dL   RDW 14.0 11.5 - 15.5 %   Platelets 206 150 - 400 K/uL   Neutrophils Relative % 86 %   Neutro Abs 14.7 (H) 1.7 - 7.7 K/uL   Lymphocytes Relative 9 %   Lymphs Abs 1.5 0.7 - 4.0 K/uL   Monocytes Relative 4 %   Monocytes Absolute 0.7 0.1 - 1.0 K/uL   Eosinophils Relative 1 %   Eosinophils Absolute 0.1 0.0 - 0.7 K/uL   Basophils Relative 0 %   Basophils Absolute 0.0 0.0 - 0.1 K/uL    Comment: Performed at [facility], [address] [place], [place] [address]  Basic metabolic panel     Status: Abnormal   Collection Time: 02/01/18  5:33 PM  Result Value Ref Range   Sodium 140  135 - 145 mmol/L   Potassium 4.0 3.5 - 5.1 mmol/L   Chloride 106 101 - 111 mmol/L   CO2 22 22 - 32 mmol/L   Glucose, Bld 234 (H) 65 - 99 mg/dL   BUN 24 (H) 6 - 20 mg/dL   Creatinine, Ser 1.21 (H) 0.44 - 1.00 mg/dL   Calcium 9.5 8.9 - 10.3 mg/dL   GFR calc non Af Amer 45 (L) >60 mL/min   GFR calc Af Amer 52 (L) >60 mL/min    Comment: (NOTE) The eGFR has been calculated using the CKD EPI equation. This calculation has not been validated in all clinical situations. eGFR's persistently <60 mL/min signify possible Chronic Kidney Disease.    Anion gap 12 5 - 15    Comment: Performed at [facility] [address] [place], [place] [address]  Protime-INR     Status: Abnormal   Collection Time: 02/01/18  5:33 PM  Result Value Ref Range   Prothrombin Time 23.1 (H) 11.4 - 15.2 seconds   INR 2.07     Comment: Performed at [facility], [place] [address]  [place], [place] [address]  APTT     Status: Abnormal   Collection Time: 02/01/18  5:33 PM  Result Value Ref Range   aPTT 49 (H) 24 - 36 seconds    Comment:        IF BASELINE aPTT IS ELEVATED, SUGGEST PATIENT RISK ASSESSMENT BE USED TO DETERMINE APPROPRIATE ANTICOAGULANT THERAPY. Performed at [facility], [place] [address], [place], [place] [address]   Surgical pcr screen     Status: None   Collection Time: 02/01/18 10:01 PM  Result Value Ref Range   MRSA, PCR NEGATIVE NEGATIVE   Staphylococcus aureus NEGATIVE NEGATIVE    Comment: (NOTE) The Xpert SA Assay (FDA approved for NASAL specimens in patients 27 years of age and older), is one component of a comprehensive surveillance program. It is not intended to diagnose infection nor to guide or monitor treatment. Performed at [facility], [place] [address], [place], [place] [address]   Glucose, capillary     Status: Abnormal   Collection Time: 02/01/18 10:26 PM  Result Value Ref Range   Glucose-Capillary 214 (H) 65 - 99 mg/dL  Type and screen [place]     Status: None   Collection Time: 02/02/18  5:55 AM  Result Value Ref Range   ABO/RH(D) A POS    Antibody Screen NEG    Sample Expiration      02/05/2018 Performed at [facility], [place] [address] [place], [place] [address]   ABO/Rh     Status: None (Preliminary result)   Collection Time: 02/02/18  5:55 AM  Result Value Ref Range   ABO/RH(D)      A POS Performed at [facility] [address], [place], [place] [address]   CBC     Status: Abnormal   Collection Time: 02/02/18  5:58 AM  Result Value Ref Range   WBC 8.9 4.0 - 10.5 K/uL   RBC 3.76 (L) 3.87 - 5.11 MIL/uL   Hemoglobin 11.3 (L) 12.0 - 15.0 g/dL   HCT 35.2 (L) 36.0 - 46.0 %   MCV 93.6 78.0 - 100.0 fL   MCH 30.1 26.0 - 34.0 pg   MCHC 32.1 30.0 - 36.0 g/dL   RDW 14.1 11.5 - 15.5 %   Platelets 190 150 - 400 K/uL  Comment: Performed at [facility], [place] [address], [place], [place] [address]  Protime-INR     Status: Abnormal   Collection Time: 02/02/18  5:58 AM  Result Value Ref Range   Prothrombin Time 21.7 (H) 11.4 - 15.2 seconds   INR 1.91     Comment: Performed at [facility] [address] [place], [place] [address]  APTT     Status: Abnormal   Collection Time: 02/02/18  5:58 AM  Result Value Ref Range   aPTT 55 (H) 24 - 36 seconds    Comment:        IF BASELINE aPTT IS ELEVATED, SUGGEST PATIENT RISK ASSESSMENT BE USED TO DETERMINE APPROPRIATE ANTICOAGULANT THERAPY. Performed at [facility], [place] [address] [place], [place] [address]   Glucose, capillary     Status: Abnormal   Collection Time: 02/02/18  6:44 AM  Result Value Ref Range   Glucose-Capillary 139 (H) 65 - 99 mg/dL    Dg Chest 1 View  Result Date: 02/01/2018 CLINICAL DATA:  Preop chest for left femoral fracture. EXAM: CHEST  1 VIEW COMPARISON:  None. FINDINGS: Cardiomegaly with ectatic thoracic aorta. Atelectasis noted at each lung base. No pulmonary edema or focal pulmonary consolidation. Remote left-sided rib fractures. No acute osseous abnormality of the bony thorax. IMPRESSION: Cardiomegaly without active pulmonary disease. Electronically Signed   By: Ashley Royalty M.D.   On: 02/01/2018 18:59   Dg Pelvis 1-2 Views  Result Date: 02/01/2018 CLINICAL DATA:  Left hip pain after slip and fall in bathtub today. EXAM: PELVIS - 1-2 VIEW COMPARISON:  None. FINDINGS: There is no evidence of pelvic fracture or diastasis. Femoral heads are seated within their acetabular components. No pelvic bone lesions are seen. Left L5-S1 assimilation joint. 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 IMPRESSION: 1. No acute fracture of the pelvis. 2. 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 3. L5-S1 left-sided assimilation joint. Electronically Signed   By: Ashley Royalty M.D.   On: 02/01/2018  18:46   Dg Tibia/fibula Left  Result Date: 02/01/2018 CLINICAL DATA:  Left leg pain after slip in the bathroom today. EXAM: LEFT TIBIA AND FIBULA - 2 VIEW COMPARISON:  None. FINDINGS: Partially visualized femoral diaphyseal fracture with lateral displacement of the distal fracture fragment. Tibial arteriosclerosis is noted. Mild joint space narrowing of the femorotibial compartment. No joint dislocation at the knee nor ankle joint. Subtalar joint is maintained as are the midfoot articulations. Soft tissues are unremarkable. IMPRESSION: Partially included femoral diaphyseal fracture with lateral displacement 1 shaft width of the distal fracture fragment. No tibial nor fibular fracture. Electronically Signed   By: Ashley Royalty M.D.   On: 02/01/2018 18:50   Dg Femur Min 2 Views Left  Result Date: 02/01/2018 CLINICAL DATA:  Left leg pain after fall in bathroom today. EXAM: LEFT FEMUR 2 VIEWS COMPARISON:  None. FINDINGS: There is an acute spiral oblique fracture of the left femoral diaphysis with 1 shaft width lateral and anterior displacement of the distal fracture fragment as well as 90 degrees of external rotation and 4 cm of foreshortening. No intra-articular extension is identified. No joint dislocation at the hip or knee. IMPRESSION: Acute spiral oblique fracture of the left femoral diaphysis with 1 shaft with anterior and lateral displacement of the distal fracture fragment with 90 degrees of external rotation and 4 cm of foreshortening/override. Electronically Signed   By: Meredith Leeds.D.  On: 02/01/2018 18:53    ROS  Constitutional: Negative for chills, fever and malaise/fatigue.  HENT: Negative for ear discharge and nosebleeds.   Eyes: Negative for double vision and photophobia.  Respiratory: Negative for cough and shortness of breath.   Cardiovascular: Negative for chest pain, orthopnea and leg swelling.  Gastrointestinal: Negative for diarrhea and vomiting.  Genitourinary: Negative for  dysuria, frequency and hematuria.  Musculoskeletal: Positive for falls and joint pain.  Skin: Negative for itching and rash.  Neurological: Negative for focal weakness, loss of consciousness and weakness.  Endo/Heme/Allergies: Bruises/bleeds easily.  Psychiatric/Behavioral: Negative for hallucinations. The patient is not nervous/anxious.     Blood pressure (!) 113/58, pulse 83, temperature 98.2 F (36.8 C), temperature source Oral, resp. rate 14, height _0  (1.372 m), weight 59 kg (130 lb), SpO2 96 %. Physical Exam  Constitutional: She is oriented to person, place, and time. She appears well-developed and well-nourished. No distress.  HENT:  Head: Normocephalic and atraumatic.  Eyes: Pupils are equal, round, and reactive to light. Conjunctivae and EOM are normal.  Neck: Normal range of motion. Neck supple.  Cardiovascular: Normal rate and intact distal pulses.  Respiratory: Effort normal. No respiratory distress.  GI: Soft. She exhibits no distension. There is no tenderness.  Musculoskeletal:  LLE NVI, good post tib and pedal pulses, intact DF/PF ankle, left leg externally rotated and shortened in immobilizer brace.  No evidence of open wounds.  Compartments are soft, no calf TTP.  No lateral hip TTP.  Pain with any movement of the leg/femur.    Neurological: She is alert and oriented to person, place, and time.  Skin: Skin is warm and dry. No rash noted. No erythema.  Psychiatric: She has a normal mood and affect. Her behavior is normal.    Assessment/Plan: acute displaced spiral oblique fracture of the left femoral diaphysis  Had discussion with pt today regarding surgical fixation of her femur fracture risks and benefits discussed and she wishes to proceed.  She will remain NPO and plan of IMN L femur this afternoon with Dr. Edmonia Lynch.  Continue pain control as needed in the meantime, bedrest in brace.  Holding coumadin INR this morning 1.91.  Appreciate medical service  assistance in patients care.    Vonna Kotyk Orlando Devereux 02/02/2018, 8:26 AM

## 2018-02-02 NOTE — Transfer of Care (Signed)
Immediate Anesthesia Transfer of Care Note  Patient: Rhonda Contreras  Procedure(s) Performed: LEFT FEMUR INTRAMEDULLARY (IM) NAIL (Left Leg Upper)  Patient Location: PACU  Anesthesia Type:General  Level of Consciousness: awake and alert   Airway & Oxygen Therapy: Patient Spontanous Breathing and Patient connected to nasal cannula oxygen  Post-op Assessment: Report given to RN and Post -op Vital signs reviewed and stable  Post vital signs: Reviewed and stable  Last Vitals:  Vitals Value Taken Time  BP    Temp 36.6 C 02/02/2018  2:49 PM  Pulse 89 02/02/2018  2:49 PM  Resp 19 02/02/2018  2:49 PM  SpO2 91 % 02/02/2018  2:49 PM  Vitals shown include unvalidated device data.  Last Pain:  Vitals:   02/02/18 1449  TempSrc:   PainSc: (P) 0-No pain      Patients Stated Pain Goal: 0 (09/09/51 0802)  Complications: No apparent anesthesia complications and neck pain

## 2018-02-02 NOTE — Anesthesia Procedure Notes (Signed)
Procedure Name: Intubation Date/Time: 02/02/2018 1:09 PM Performed by: Oletta Lamas, CRNA Pre-anesthesia Checklist: Patient identified, Emergency Drugs available, Suction available and Patient being monitored Patient Re-evaluated:Patient Re-evaluated prior to induction Oxygen Delivery Method: Circle System Utilized Preoxygenation: Pre-oxygenation with 100% oxygen Induction Type: IV induction Ventilation: Mask ventilation without difficulty Laryngoscope Size: Mac and 3 Grade View: Grade II Tube type: Oral Tube size: 7.5 mm Number of attempts: 1 Airway Equipment and Method: Stylet Placement Confirmation: ETT inserted through vocal cords under direct vision,  positive ETCO2 and breath sounds checked- equal and bilateral Secured at: 22 cm Tube secured with: Tape Dental Injury: Teeth and Oropharynx as per pre-operative assessment

## 2018-02-02 NOTE — Consult Note (Signed)
Reason for Consult: Left femur fracture Referring Physician: Jamie Pilcher Ward PA-C, [facility]  Rhonda Contreras is an 66 y.o. female.  HPI: Rhonda Contreras is a 66 y.o. female with medical history significant of HTN, lupus, DM type II, CVA on chronic anticoagulation, and osteoporosis; who presents after having a fall at home with left leg pain.  Patient provides her own history and at baseline patient lives alone. She was in the bathroom getting ready to go out when her slipper slipped from underneath her and she fell landing onto her left leg.  Denies any trauma to her head or loss of consciousness.  She was able to crawl to her cell phone to call for EMS.  Reports having severe pain in the left leg unable to bear weight.  Was unable to try anything to relieve pain symptoms.  Otherwise, patient notes being in her normal state of health and had recently been started on Fosamax for osteoporosis within the last month.  Her blood sugars are normally low and her last hemoglobin A1c was less than 7.   X-ray imaging shows acute displaced spiral oblique fracture of the left femoral diaphysis.   Ortho was consulted and patient was admitted overnight by medicine service using hip fracture protocol and make NPO after midnight.      Past Medical History:  Diagnosis Date  . Cancer (HCC)    skin  . Diabetes mellitus without complication (HCC)   . Hypertension   . Immune deficiency disorder (HCC)   . Lupus (systemic lupus erythematosus) (HCC)   . Renal disorder    Stage 2 KD  . Stroke (HCC)    2009    Past Surgical History:  Procedure Laterality Date  . TONSILLECTOMY      Family History  Problem Relation Age of Onset  . Heart attack Mother     Social History:  reports that she has never smoked. She has never used smokeless tobacco. She reports that she drank alcohol. She reports that she does not use drugs.  Allergies: No Known Allergies  Medications: I have reviewed the patient's current  medications.  Results for orders placed or performed during the hospital encounter of 02/01/18 (from the past 48 hour(s))  CBC with Differential     Status: Abnormal   Collection Time: 02/01/18  5:33 PM  Result Value Ref Range   WBC 17.0 (H) 4.0 - 10.5 K/uL   RBC 4.28 3.87 - 5.11 MIL/uL   Hemoglobin 13.0 12.0 - 15.0 g/dL   HCT 40.0 36.0 - 46.0 %   MCV 93.5 78.0 - 100.0 fL   MCH 30.4 26.0 - 34.0 pg   MCHC 32.5 30.0 - 36.0 g/dL   RDW 14.0 11.5 - 15.5 %   Platelets 206 150 - 400 K/uL   Neutrophils Relative % 86 %   Neutro Abs 14.7 (H) 1.7 - 7.7 K/uL   Lymphocytes Relative 9 %   Lymphs Abs 1.5 0.7 - 4.0 K/uL   Monocytes Relative 4 %   Monocytes Absolute 0.7 0.1 - 1.0 K/uL   Eosinophils Relative 1 %   Eosinophils Absolute 0.1 0.0 - 0.7 K/uL   Basophils Relative 0 %   Basophils Absolute 0.0 0.0 - 0.1 K/uL    Comment: Performed at [facility], [address] [place], [place] [address]  Basic metabolic panel     Status: Abnormal   Collection Time: 02/01/18  5:33 PM  Result Value Ref Range   Sodium 140   135 - 145 mmol/L   Potassium 4.0 3.5 - 5.1 mmol/L   Chloride 106 101 - 111 mmol/L   CO2 22 22 - 32 mmol/L   Glucose, Bld 234 (H) 65 - 99 mg/dL   BUN 24 (H) 6 - 20 mg/dL   Creatinine, Ser 1.21 (H) 0.44 - 1.00 mg/dL   Calcium 9.5 8.9 - 10.3 mg/dL   GFR calc non Af Amer 45 (L) >60 mL/min   GFR calc Af Amer 52 (L) >60 mL/min    Comment: (NOTE) The eGFR has been calculated using the CKD EPI equation. This calculation has not been validated in all clinical situations. eGFR's persistently <60 mL/min signify possible Chronic Kidney Disease.    Anion gap 12 5 - 15    Comment: Performed at [facility], [address] [place], [place] [address]  Protime-INR     Status: Abnormal   Collection Time: 02/01/18  5:33 PM  Result Value Ref Range   Prothrombin Time 23.1 (H) 11.4 - 15.2 seconds   INR 2.07     Comment: Performed at [facility], [address]  [place], [place] [address]  APTT     Status: Abnormal   Collection Time: 02/01/18  5:33 PM  Result Value Ref Range   aPTT 49 (H) 24 - 36 seconds    Comment:        IF BASELINE aPTT IS ELEVATED, SUGGEST PATIENT RISK ASSESSMENT BE USED TO DETERMINE APPROPRIATE ANTICOAGULANT THERAPY. Performed at [facility], [address] [place], [place] [address]   Surgical pcr screen     Status: None   Collection Time: 02/01/18 10:01 PM  Result Value Ref Range   MRSA, PCR NEGATIVE NEGATIVE   Staphylococcus aureus NEGATIVE NEGATIVE    Comment: (NOTE) The Xpert SA Assay (FDA approved for NASAL specimens in patients 22 years of age and older), is one component of a comprehensive surveillance program. It is not intended to diagnose infection nor to guide or monitor treatment. Performed at [facility], [address] [place], [place] [address]   Glucose, capillary     Status: Abnormal   Collection Time: 02/01/18 10:26 PM  Result Value Ref Range   Glucose-Capillary 214 (H) 65 - 99 mg/dL  Type and screen [facility]     Status: None   Collection Time: 02/02/18  5:55 AM  Result Value Ref Range   ABO/RH(D) A POS    Antibody Screen NEG    Sample Expiration      02/05/2018 Performed at [facility], [address] [place], [place] [address]   ABO/Rh     Status: None (Preliminary result)   Collection Time: 02/02/18  5:55 AM  Result Value Ref Range   ABO/RH(D)      A POS Performed at [facility], [address] , [place] [address]   CBC     Status: Abnormal   Collection Time: 02/02/18  5:58 AM  Result Value Ref Range   WBC 8.9 4.0 - 10.5 K/uL   RBC 3.76 (L) 3.87 - 5.11 MIL/uL   Hemoglobin 11.3 (L) 12.0 - 15.0 g/dL   HCT 35.2 (L) 36.0 - 46.0 %   MCV 93.6 78.0 - 100.0 fL   MCH 30.1 26.0 - 34.0 pg   MCHC 32.1 30.0 - 36.0 g/dL   RDW 14.1 11.5 - 15.5 %   Platelets 190 150 - 400 K/uL      Comment: Performed at [facility], [address] [place], [place] [address]  Protime-INR     Status: Abnormal   Collection Time: 02/02/18  5:58 AM  Result Value Ref Range   Prothrombin Time 21.7 (H) 11.4 - 15.2 seconds   INR 1.91     Comment: Performed at [facility], [address] [place], [place] [address]  APTT     Status: Abnormal   Collection Time: 02/02/18  5:58 AM  Result Value Ref Range   aPTT 55 (H) 24 - 36 seconds    Comment:        IF BASELINE aPTT IS ELEVATED, SUGGEST PATIENT RISK ASSESSMENT BE USED TO DETERMINE APPROPRIATE ANTICOAGULANT THERAPY. Performed at [facility], [address] [place], [place] [address]   Glucose, capillary     Status: Abnormal   Collection Time: 02/02/18  6:44 AM  Result Value Ref Range   Glucose-Capillary 139 (H) 65 - 99 mg/dL    Dg Chest 1 View  Result Date: 02/01/2018 CLINICAL DATA:  Preop chest for left femoral fracture. EXAM: CHEST  1 VIEW COMPARISON:  None. FINDINGS: Cardiomegaly with ectatic thoracic aorta. Atelectasis noted at each lung base. No pulmonary edema or focal pulmonary consolidation. Remote left-sided rib fractures. No acute osseous abnormality of the bony thorax. IMPRESSION: Cardiomegaly without active pulmonary disease. Electronically Signed   By: David  Kwon M.D.   On: 02/01/2018 18:59   Dg Pelvis 1-2 Views  Result Date: 02/01/2018 CLINICAL DATA:  Left hip pain after slip and fall in bathtub today. EXAM: PELVIS - 1-2 VIEW COMPARISON:  None. FINDINGS: There is no evidence of pelvic fracture or diastasis. Femoral heads are seated within their acetabular components. No pelvic bone lesions are seen. Left L5-S1 assimilation joint. 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 IMPRESSION: 1. No acute fracture of the pelvis. 2. 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 3. L5-S1 left-sided assimilation joint. Electronically Signed   By: David  Kwon M.D.   On: 02/01/2018  18:46   Dg Tibia/fibula Left  Result Date: 02/01/2018 CLINICAL DATA:  Left leg pain after slip in the bathroom today. EXAM: LEFT TIBIA AND FIBULA - 2 VIEW COMPARISON:  None. FINDINGS: Partially visualized femoral diaphyseal fracture with lateral displacement of the distal fracture fragment. Tibial arteriosclerosis is noted. Mild joint space narrowing of the femorotibial compartment. No joint dislocation at the knee nor ankle joint. Subtalar joint is maintained as are the midfoot articulations. Soft tissues are unremarkable. IMPRESSION: Partially included femoral diaphyseal fracture with lateral displacement 1 shaft width of the distal fracture fragment. No tibial nor fibular fracture. Electronically Signed   By: David  Kwon M.D.   On: 02/01/2018 18:50   Dg Femur Min 2 Views Left  Result Date: 02/01/2018 CLINICAL DATA:  Left leg pain after fall in bathroom today. EXAM: LEFT FEMUR 2 VIEWS COMPARISON:  None. FINDINGS: There is an acute spiral oblique fracture of the left femoral diaphysis with 1 shaft width lateral and anterior displacement of the distal fracture fragment as well as 90 degrees of external rotation and 4 cm of foreshortening. No intra-articular extension is identified. No joint dislocation at the hip or knee. IMPRESSION: Acute spiral oblique fracture of the left femoral diaphysis with 1 shaft with anterior and lateral displacement of the distal fracture fragment with 90 degrees of external rotation and 4 cm of foreshortening/override. Electronically Signed   By: David  Kwon M.D.     On: 02/01/2018 18:53    ROS  Constitutional: Negative for chills, fever and malaise/fatigue.  HENT: Negative for ear discharge and nosebleeds.   Eyes: Negative for double vision and photophobia.  Respiratory: Negative for cough and shortness of breath.   Cardiovascular: Negative for chest pain, orthopnea and leg swelling.  Gastrointestinal: Negative for diarrhea and vomiting.  Genitourinary: Negative for  dysuria, frequency and hematuria.  Musculoskeletal: Positive for falls and joint pain.  Skin: Negative for itching and rash.  Neurological: Negative for focal weakness, loss of consciousness and weakness.  Endo/Heme/Allergies: Bruises/bleeds easily.  Psychiatric/Behavioral: Negative for hallucinations. The patient is not nervous/anxious.     Blood pressure (!) 113/58, pulse 83, temperature 98.2 F (36.8 C), temperature source Oral, resp. rate 14, height 4' 6" (1.372 m), weight 59 kg (130 lb), SpO2 96 %. Physical Exam  Constitutional: She is oriented to person, place, and time. She appears well-developed and well-nourished. No distress.  HENT:  Head: Normocephalic and atraumatic.  Eyes: Pupils are equal, round, and reactive to light. Conjunctivae and EOM are normal.  Neck: Normal range of motion. Neck supple.  Cardiovascular: Normal rate and intact distal pulses.  Respiratory: Effort normal. No respiratory distress.  GI: Soft. She exhibits no distension. There is no tenderness.  Musculoskeletal:  LLE NVI, good post tib and pedal pulses, intact DF/PF ankle, left leg externally rotated and shortened in immobilizer brace.  No evidence of open wounds.  Compartments are soft, no calf TTP.  No lateral hip TTP.  Pain with any movement of the leg/femur.    Neurological: She is alert and oriented to person, place, and time.  Skin: Skin is warm and dry. No rash noted. No erythema.  Psychiatric: She has a normal mood and affect. Her behavior is normal.    Assessment/Plan: acute displaced spiral oblique fracture of the left femoral diaphysis  Had discussion with pt today regarding surgical fixation of her femur fracture risks and benefits discussed and she wishes to proceed.  She will remain NPO and plan of IMN L femur this afternoon with Dr. Timothy Murphy.  Continue pain control as needed in the meantime, bedrest in brace.  Holding coumadin INR this morning 1.91.  Appreciate medical service  assistance in patients care.    Joshua Chadwell 02/02/2018, 8:26 AM     

## 2018-02-02 NOTE — Anesthesia Preprocedure Evaluation (Addendum)
Anesthesia Evaluation  Patient identified by MRN, date of birth, ID band Patient awake    Reviewed: Allergy & Precautions, NPO status , Patient's Chart, lab work & pertinent test results  Airway Mallampati: II  TM Distance: >3 FB Neck ROM: Limited    Dental no notable dental hx. (+) Teeth Intact, Dental Advisory Given   Pulmonary neg pulmonary ROS,    Pulmonary exam normal breath sounds clear to auscultation       Cardiovascular hypertension, Normal cardiovascular exam Rhythm:Regular Rate:Normal     Neuro/Psych CVA, Residual Symptoms negative psych ROS   GI/Hepatic negative GI ROS, Neg liver ROS,   Endo/Other  diabetes  Renal/GU Renal InsufficiencyRenal disease  negative genitourinary   Musculoskeletal negative musculoskeletal ROS (+)   Abdominal   Peds negative pediatric ROS (+)  Hematology On coumadin    Anesthesia Other Findings   Reproductive/Obstetrics negative OB ROS                            Anesthesia Physical Anesthesia Plan  ASA: III  Anesthesia Plan: General   Post-op Pain Management:    Induction: Intravenous  PONV Risk Score and Plan: 3 and Ondansetron, Dexamethasone and Treatment may vary due to age or medical condition  Airway Management Planned: Oral ETT  Additional Equipment:   Intra-op Plan:   Post-operative Plan: Extubation in OR  Informed Consent: I have reviewed the patients History and Physical, chart, labs and discussed the procedure including the risks, benefits and alternatives for the proposed anesthesia with the patient or authorized representative who has indicated his/her understanding and acceptance.   Dental advisory given  Plan Discussed with: CRNA and Surgeon  Anesthesia Plan Comments:         Anesthesia Quick Evaluation

## 2018-02-02 NOTE — Op Note (Signed)
02/01/2018 - 02/02/2018  2:02 PM  PATIENT:  Rhonda Contreras    PRE-OPERATIVE DIAGNOSIS:  left femur fracture  POST-OPERATIVE DIAGNOSIS:  Same  PROCEDURE:  LEFT FEMUR INTRAMEDULLARY (IM) NAIL  SURGEON:  Freddy Kinne, Ernesta Amble, MD  ASSISTANT: Roxan Hockey, PA-C, he was present and scrubbed throughout the case, critical for completion in a timely fashion, and for retraction, instrumentation, and closure.   ANESTHESIA:   Gen  PREOPERATIVE INDICATIONS:  [name] is a  67 y.o. female with a diagnosis of left femur fracture who failed conservative measures and elected for surgical management.    The risks benefits and alternatives were discussed with the patient preoperatively including but not limited to the risks of infection, bleeding, nerve injury, cardiopulmonary complications, the need for revision surgery, among others, and the patient was willing to proceed.  OPERATIVE IMPLANTS: [employer] SCN nail 10x320  OPERATIVE FINDINGS: unstable distal femur fracture  BLOOD LOSS: 628  COMPLICATIONS: cone  TOURNIQUET TIME: none  OPERATIVE PROCEDURE:  Patient was identified in the preoperative holding area and site was marked by me She was transported to the operating theater and placed on the table in supine position taking care to pad all bony prominences. After a preincinduction time out anesthesia was induced. The left Lower extremity was prepped and draped in normal sterile fashion and a pre-incision timeout was performed. She received ancef for preoperative antibiotics.   I performed a closed reduction of the fracture. I made a lateral incision and clamped the fracture in place  I then made a medial parapatellar incision and dissected down to the peritenon. I continued that incision to the medial parapatellar approach and was careful to protect the articular cartilage meniscus and cruciates.  Next I inserted the guidepin under fluoroscopic visualization at the tip of [employer] line  and centered on the AP. I used the entry reamer to ream an entryway into the canal this point.  Next I passed the ball tip wire across the fracture confirmed in appropriate reduction and sequentially reamed up to the appropriate size for the above nail. The nail ended above the subtrochanteric region.  I selected the nail and inserted it under fluoroscopic guidance.  I was happy with the reduction I then placed three distal interlock screws obtaining a good purchase with all of them.  I then locked the distal screws and using a perfect circles technique placed a anterior to posterior proximal interlock screw.  final fluoroscopic images showed appropriate reduction and alignment of hardware I then thoroughly irrigated all wounds and the skin was closed.  POST OPERATIVE PLAN: Nonweightbearing knee immobilizer full time. DVT px: SCD's and chemical px per primary

## 2018-02-02 NOTE — Progress Notes (Signed)
PROGRESS NOTE                                                                                                                                                                                                             Patient Demographics:    Rhonda Contreras, is a 67 y.o. female, DOB - [birth year], DJM:[id number]  Admit date - 02/01/2018   Admitting Physician Norval Morton, MD  Outpatient Primary MD for the patient is Wenda Low, MD  LOS - 1  Outpatient Specialists: None  Chief Complaint  Patient presents with  . Fall       Brief Narrative   68 year old female with history of hypertension, lupus, type 2 diabetes mellitus, CVA on chronic anticoagulation with Coumadin, osteoporosis presented with mechanical fall at home and sustained a displaced spiral oblique fracture of the left femoral diaphysis.  Patient taken to the OR today and underwent left femur intramedullary nail placement.   Subjective:   Seen after returning from the OR.  Reports pain in her leg to be better at present.   Assessment  & Plan :    Principal Problem: Left femur fracture, subsequent encounter ([place]) Secondary to mechanical fall.  Underwent left femur intramedullary nail placement this afternoon.  Continue Foley for today.  Resume diet.  Resume anti-correlation from tomorrow. PRN IV morphine and hydrocodone for pain.  Scheduled bowel regimen. PT evaluation in a.m.  Monitor H&H.  Patient lives alone and will need SNF.  Active Problems: Acute leukocytosis Suspect reactive.  No signs of infection.  Resolved and subsequent lab.     Diabetes mellitus type 2 in nonobese [facility]) Well-controlled on oral medications.  Elevated CBG on admission likely due to stress.  Holding Amaryl and metformin and monitoring with sliding scale coverage.  History of CVA on chronic anticoagulation Has residual mild right-sided weakness.  INR therapeutic on  admission.  Coumadin held for surgery and can be resumed from tomorrow.  Essential hypertension Resume home medications.  Blood pressure stable.  Chronic kidney disease stage III Renal function stable.  ?Chronic diastolic CHF Patient is euvolemic.  Monitor for now.  Osteoporosis Alendronate as outpatient   Code Status : Full code  Family Communication  : None at bedside  Disposition Plan  : SNF possibly in 48 hours  Barriers For Discharge : Postop  Consults  :  Dr. Percell Miller  Procedures  : Left femur IM nail  DVT Prophylaxis  : Therapeutic on Coumadin  Lab Results  Component Value Date   PLT 190 02/02/2018    Antibiotics  :   Anti-infectives (From admission, onward)   Start     Dose/Rate Route Frequency Ordered Stop   02/02/18 1800  ceFAZolin (ANCEF) IVPB 2g/100 mL premix     2 g 200 mL/hr over 30 Minutes Intravenous Every 6 hours 02/02/18 1618 02/03/18 0559   02/02/18 1254  ceFAZolin (ANCEF) 2-4 GM/100ML-% IVPB    Note to Pharmacy:  Starleen Arms   : cabinet override      02/02/18 1254 02/03/18 0059   02/02/18 1254  ceFAZolin (ANCEF) 2-4 GM/100ML-% IVPB  Status:  Discontinued    Note to Pharmacy:  Lisette Grinder   : cabinet override      02/02/18 1254 02/02/18 1328        Objective:   Vitals:   02/02/18 1519 02/02/18 1532 02/02/18 1548 02/02/18 1604  BP: 110/66 (!) 105/58 (!) 108/55 (!) 106/57  Pulse: 89 86 83 72  Resp: 18 19 18    Temp:   98 F (36.7 C) 97.9 F (36.6 C)  TempSrc:    Oral  SpO2: 96% 93% 94% 95%  Weight:      Height:        Wt Readings from Last 3 Encounters:  02/01/18 59 kg (130 lb)     Intake/Output Summary (Last 24 hours) at 02/02/2018 1651 Last data filed at 02/02/2018 1548 Gross per 24 hour  Intake 1250 ml  Output 850 ml  Net 400 ml     Physical Exam  Gen: not in distress HEENT: no pallor, moist mucosa, supple neck Chest: clear b/l, no added sounds CVS: N S1&S2, no murmurs, rubs or gallop GI: soft, NT, ND,  BS+ Musculoskeletal: warm, Ace wrap over left femur, Foley in place, warm extremities with distal pulses palpable.     Data Review:    CBC Recent Labs  Lab 02/01/18 1733 02/02/18 0558  WBC 17.0* 8.9  HGB 13.0 11.3*  HCT 40.0 35.2*  PLT 206 190  MCV 93.5 93.6  MCH 30.4 30.1  MCHC 32.5 32.1  RDW 14.0 14.1  LYMPHSABS 1.5  --   MONOABS 0.7  --   EOSABS 0.1  --   BASOSABS 0.0  --     Chemistries  Recent Labs  Lab 02/01/18 1733  NA 140  K 4.0  CL 106  CO2 22  GLUCOSE 234*  BUN 24*  CREATININE 1.21*  CALCIUM 9.5   ------------------------------------------------------------------------------------------------------------------ No results for input(s): CHOL, HDL, LDLCALC, TRIG, CHOLHDL, LDLDIRECT in the last 72 hours.  Lab Results  Component Value Date   HGBA1C (H) 05/15/2008    9.0 (NOTE)   The ADA recommends the following therapeutic goal for glycemic   control related to Hgb A1C measurement:   Goal of Therapy:   < 7.0% Hgb A1C   Reference: American Diabetes Association: Clinical Practice   Recommendations 2008, Diabetes Care,  2008, 31:(Suppl 1).   ------------------------------------------------------------------------------------------------------------------ No results for input(s): TSH, T4TOTAL, T3FREE, THYROIDAB in the last 72 hours.  Invalid input(s): FREET3 ------------------------------------------------------------------------------------------------------------------ No results for input(s): VITAMINB12, FOLATE, FERRITIN, TIBC, IRON, RETICCTPCT in the last 72 hours.  Coagulation profile Recent Labs  Lab 02/01/18 1733 02/02/18 0558  INR 2.07 1.91    No results for input(s): DDIMER in the last 72 hours.  Cardiac Enzymes No results for input(s): CKMB, TROPONINI,  MYOGLOBIN in the last 168 hours.  Invalid input(s): CK ------------------------------------------------------------------------------------------------------------------ No results  found for: BNP  Inpatient Medications  Scheduled Meds: . amLODipine  10 mg Oral Daily  . atorvastatin  20 mg Oral Daily  . docusate sodium  100 mg Oral BID  . [START ON 02/03/2018] enoxaparin (LOVENOX) injection  30 mg Subcutaneous Q24H  . insulin aspart  0-5 Units Subcutaneous QHS  . insulin aspart  0-9 Units Subcutaneous TID WC  . ketoconazole  1 application Topical Daily  . ketorolac  7.5 mg Intravenous Q6H  . metoprolol tartrate  100 mg Oral BID  . senna  1 tablet Oral Daily   Continuous Infusions: . ceFAZolin    .  ceFAZolin (ANCEF) IV    . lactated ringers    . methocarbamol (ROBAXIN)  IV     PRN Meds:.[START ON 02/03/2018] acetaminophen, bisacodyl, HYDROcodone-acetaminophen, methocarbamol **OR** methocarbamol (ROBAXIN)  IV, metoCLOPramide **OR** metoCLOPramide (REGLAN) injection, morphine injection, ondansetron **OR** ondansetron (ZOFRAN) IV, polyethylene glycol, sodium phosphate  Micro Results Recent Results (from the past 240 hour(s))  Surgical pcr screen     Status: None   Collection Time: 02/01/18 10:01 PM  Result Value Ref Range Status   MRSA, PCR NEGATIVE NEGATIVE Final   Staphylococcus aureus NEGATIVE NEGATIVE Final    Comment: (NOTE) The Xpert SA Assay (FDA approved for NASAL specimens in patients 17 years of age and older), is one component of a comprehensive surveillance program. It is not intended to diagnose infection nor to guide or monitor treatment. Performed at [facility], [place] [address] [place], [place] [address]     Radiology Reports Dg Chest 1 View  Result Date: 02/01/2018 CLINICAL DATA:  Preop chest for left femoral fracture. EXAM: CHEST  1 VIEW COMPARISON:  None. FINDINGS: Cardiomegaly with ectatic thoracic aorta. Atelectasis noted at each lung base. No pulmonary edema or focal pulmonary consolidation. Remote left-sided rib fractures. No acute osseous abnormality of the bony thorax. IMPRESSION: Cardiomegaly without active pulmonary  disease. Electronically Signed   By: Ashley Royalty M.D.   On: 02/01/2018 18:59   Dg Pelvis 1-2 Views  Result Date: 02/01/2018 CLINICAL DATA:  Left hip pain after slip and fall in bathtub today. EXAM: PELVIS - 1-2 VIEW COMPARISON:  None. FINDINGS: There is no evidence of pelvic fracture or diastasis. Femoral heads are seated within their acetabular components. No pelvic bone lesions are seen. Left L5-S1 assimilation joint. 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 IMPRESSION: 1. No acute fracture of the pelvis. 2. 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 3. L5-S1 left-sided assimilation joint. Electronically Signed   By: Ashley Royalty M.D.   On: 02/01/2018 18:46   Dg Tibia/fibula Left  Result Date: 02/01/2018 CLINICAL DATA:  Left leg pain after slip in the bathroom today. EXAM: LEFT TIBIA AND FIBULA - 2 VIEW COMPARISON:  None. FINDINGS: Partially visualized femoral diaphyseal fracture with lateral displacement of the distal fracture fragment. Tibial arteriosclerosis is noted. Mild joint space narrowing of the femorotibial compartment. No joint dislocation at the knee nor ankle joint. Subtalar joint is maintained as are the midfoot articulations. Soft tissues are unremarkable. IMPRESSION: Partially included femoral diaphyseal fracture with lateral displacement 1 shaft width of the distal fracture fragment. No tibial nor fibular fracture. Electronically Signed   By: Ashley Royalty M.D.   On: 02/01/2018 18:50   Dg C-arm 1-60 Min  Result Date: 02/02/2018 CLINICAL DATA:  LEFT FEMUR INTRAMEDULLARY (IM) NAIL For  Left Femur Fracture. Fluoro Time is 1 min 5 secs. EXAM: LEFT FEMUR 2 VIEWS; DG C-ARM 61-120 MIN COMPARISON:  02/01/2018 FINDINGS: The displaced spiral fracture of mid to distal left femoral shaft has been reduced with an intramedullary rod. The fracture is in near anatomic alignment. The orthopedic hardware  appears well seated. IMPRESSION: Well-aligned left femur fracture following ORIF. Electronically Signed   By: Lajean Manes M.D.   On: 02/02/2018 15:34   Dg Femur Min 2 Views Left  Result Date: 02/02/2018 CLINICAL DATA:  LEFT FEMUR INTRAMEDULLARY (IM) NAIL For Left Femur Fracture. Fluoro Time is 1 min 5 secs. EXAM: LEFT FEMUR 2 VIEWS; DG C-ARM 61-120 MIN COMPARISON:  02/01/2018 FINDINGS: The displaced spiral fracture of mid to distal left femoral shaft has been reduced with an intramedullary rod. The fracture is in near anatomic alignment. The orthopedic hardware appears well seated. IMPRESSION: Well-aligned left femur fracture following ORIF. Electronically Signed   By: Lajean Manes M.D.   On: 02/02/2018 15:34   Dg Femur Min 2 Views Left  Result Date: 02/01/2018 CLINICAL DATA:  Left leg pain after fall in bathroom today. EXAM: LEFT FEMUR 2 VIEWS COMPARISON:  None. FINDINGS: There is an acute spiral oblique fracture of the left femoral diaphysis with 1 shaft width lateral and anterior displacement of the distal fracture fragment as well as 90 degrees of external rotation and 4 cm of foreshortening. No intra-articular extension is identified. No joint dislocation at the hip or knee. IMPRESSION: Acute spiral oblique fracture of the left femoral diaphysis with 1 shaft with anterior and lateral displacement of the distal fracture fragment with 90 degrees of external rotation and 4 cm of foreshortening/override. Electronically Signed   By: Ashley Royalty M.D.   On: 02/01/2018 18:53    Time Spent in minutes  25   Yakima Kreitzer M.D on 02/02/2018 at 4:51 PM  Between 7am to 7pm - Pager - [phone]  After 7pm go to [email or web address] - password [facility]  [facility] -  Office  [phone]

## 2018-02-02 NOTE — Interval H&P Note (Signed)
History and Physical Interval Note:  02/02/2018 1:02 PM  Rhonda Contreras  has presented today for surgery, with the diagnosis of left femur fracture  The various methods of treatment have been discussed with the patient and family. After consideration of risks, benefits and other options for treatment, the patient has consented to  Procedure(s): LEFT FEMUR INTRAMEDULLARY (IM) NAIL (Left) as a surgical intervention .  The patient's history has been reviewed, patient examined, no change in status, stable for surgery.  I have reviewed the patient's chart and labs.  Questions were answered to the patient's satisfaction.     [name]

## 2018-02-03 LAB — BASIC METABOLIC PANEL
ANION GAP: 8 (ref 5–15)
BUN: 16 mg/dL (ref 6–20)
CHLORIDE: 107 mmol/L (ref 101–111)
CO2: 24 mmol/L (ref 22–32)
Calcium: 8.1 mg/dL — ABNORMAL LOW (ref 8.9–10.3)
Creatinine, Ser: 0.93 mg/dL (ref 0.44–1.00)
GFR calc non Af Amer: >60 mL/min (ref >60)
GLUCOSE: 128 mg/dL — AB (ref 65–99)
POTASSIUM: 3.6 mmol/L (ref 3.5–5.1)
Sodium: 139 mmol/L (ref 135–145)

## 2018-02-03 LAB — CBC
HEMATOCRIT: 28.7 % — AB (ref 36.0–46.0)
HEMOGLOBIN: 9.3 g/dL — AB (ref 12.0–15.0)
MCH: 30.6 pg (ref 26.0–34.0)
MCHC: 32.4 g/dL (ref 30.0–36.0)
MCV: 94.4 fL (ref 78.0–100.0)
Platelets: 138 10*3/uL — ABNORMAL LOW (ref 150–400)
RBC: 3.04 MIL/uL — AB (ref 3.87–5.11)
RDW: 14.3 % (ref 11.5–15.5)
WBC: 7.5 10*3/uL (ref 4.0–10.5)

## 2018-02-03 LAB — GLUCOSE, CAPILLARY
GLUCOSE-CAPILLARY: 166 mg/dL — AB (ref 65–99)
GLUCOSE-CAPILLARY: 164 mg/dL — AB (ref 65–99)
GLUCOSE-CAPILLARY: 154 mg/dL — AB (ref 65–99)
Glucose-Capillary: 127 mg/dL — ABNORMAL HIGH (ref 65–99)

## 2018-02-03 MED ORDER — WARFARIN - PHARMACIST DOSING INPATIENT: Freq: Every day | Status: DC

## 2018-02-03 MED ORDER — WARFARIN SODIUM 6 MG PO TABS
6.0000 mg | ORAL_TABLET | Freq: Once | ORAL | Status: AC
  Administered 2018-02-03: 6 mg via ORAL
  Filled 2018-02-03: qty 1

## 2018-02-03 NOTE — Progress Notes (Signed)
    Subjective: Patient reports feeling good.  Pain mild, much improved.  Tolerating diet.  Urinating.  No CP, SOB.  Not yet OOB.  Objective:   VITALS:   Vitals:   02/02/18 1604 02/02/18 2002 02/03/18 0011 02/03/18 0525  BP: (!) 106/57 125/70 (!) 105/51 (!) 113/54  Pulse: 72 89 80 75  Resp:  12  12  Temp: 97.9 F (36.6 C) 99.2 F (37.3 C) 98.9 F (37.2 C) 98 F (36.7 C)  TempSrc: Oral Oral Oral Oral  SpO2: 95% 94% 91% 96%  Weight:      Height:       CBC Latest Ref Rng & Units 02/02/2018 02/01/2018 07/10/2008  WBC 4.0 - 10.5 K/uL 8.9 17.0(H) 8.1  Hemoglobin 12.0 - 15.0 g/dL 11.3(L) 13.0 11.9(L)  Hematocrit 36.0 - 46.0 % 35.2(L) 40.0 34.5(L)  Platelets 150 - 400 K/uL 190 206 117(L)   BMP Latest Ref Rng & Units 02/02/2018 02/01/2018 07/12/2008  Glucose 65 - 99 mg/dL - 234(H) 113(H)  BUN 6 - 20 mg/dL - 24(H) 18 DELTA CHECK NOTED  Creatinine 0.44 - 1.00 mg/dL 0.97 1.21(H) 1.16 DELTA CHECK NOTED  Sodium 135 - 145 mmol/L - 140 139  Potassium 3.5 - 5.1 mmol/L - 4.0 3.5  Chloride 101 - 111 mmol/L - 106 109  CO2 22 - 32 mmol/L - 22 21  Calcium 8.9 - 10.3 mg/dL - 9.5 7.5(L)   Intake/Output      04/11 0701 - 04/12 0700   P.O. 480   I.V. (mL/kg) 2253.3 (38.2)   IV Piggyback 0   Total Intake(mL/kg) 2733.3 (46.3)   Urine (mL/kg/hr) 850 (0.6)   Blood 100   Total Output 950   Net +1783.3          Physical Exam: General: NAD.  Upright in bed.  Calm, pleasant, conversant.  No increased work of breathing. MSK LLE: Neurovascularly intact Sensation intact distally Feet warm Dorsiflexion/Plantar flexion intact Incision: dressing C/D/I   Assessment: 1 Day Post-Op  S/P Procedure(s) (LRB): LEFT FEMUR INTRAMEDULLARY (IM) NAIL (Left) by Dr. Ernesta Amble. Percell Miller on 02/02/2018  Principal Problem:   Other fracture of left femur, initial encounter for closed fracture [facility]) Active Problems:   Diabetes mellitus type 2 in nonobese [facility])   Chronic anticoagulation   Essential  hypertension   History of CVA (cerebrovascular accident)   Closed left femur fracture, status post retrograde IM nail Doing very well postop day 1. Pain controlled. Appetite okay, eating some diet. She has not yet been up with therapy.  Plan: Advance diet Up with therapy Incentive Spirometry Elevate and Apply ice  Weightbearing: TDWB LLE Insicional and dressing care: Dressings left intact until follow-up.  Reinforce PRN. Showering: Keep dressing dry VTE prophylaxis: Resume Coumadin, SCDs, ambulation Pain control: Tylenol, Norco Follow - up plan: 1-2 weeks.  We will continue to follow immediate postop phase. Contact information:  Edmonia Lynch MD, Roxan Hockey PA-C  Dispo: Skilled Nursing Facility/Rehab.  Therapy evaluations pending.  She lives alone but has a posterior in town who can check her when she is able to go home.    Prudencio Burly III, PA-C 02/03/2018, 6:35 AM

## 2018-02-03 NOTE — Discharge Instructions (Addendum)
Fall Prevention in Hospitals, Adult WHAT ARE SOME SAFETY TIPS FOR PREVENTING FALLS? If you or a loved one has to stay in the hospital, talk with the health care providers about the risk of falling. Find out which medicines or treatments can cause dizziness or affect balance. Make a plan with the health care providers to prevent falls. The plan may include these points:  Ask for help moving around, especially after surgery or when feeling unwell.  Have support available when getting up and moving around.  Wear nonskid footwear.  Use the safety straps on wheelchairs.  Ask for help to get objects that are out of reach.  Wear eyeglasses.  Remove all clutter from the floor and the sides of the bed.  Keep equipment and wires securely out of the way.  Keep the bed locked in the low position.  Keep the side rails up on the bed.  Keep the nurse call button within reach.  Keep the door open when no one else is in the room.  Have someone stay in the hospital with you or your loved one.  Ask for a bed alarm if you are not able to stay with your loved one who is at risk for getting up without help.  Ask if sleeping pills or other medicines that alter mental status are necessary.  WHAT INCREASES THE RISK FOR FALLS? Certain conditions and treatments may increase a patient's risk for falls in a hospital. These include:  Being in an unfamiliar environment.  Being on bed rest.  Having surgery.  Taking certain medicines, such as sleeping pills.  Having tubes in place, such as IV lines or catheters.  Additional risk factors for falls in a hospital include:  Having vision problems.  Having a change in thinking, feeling, or behavior (altered mental status).  Having trouble with balance.  Needing to use the toilet frequently.  Having fallen in the past three months.  Having low blood pressure when standing up quickly (orthostatic hypotension).  WHAT DOES THE HOSPITAL STAFF  DO TO HELP PREVENT ME OR MY LOVED ONE FROM FALLING? Hospitals have systems in place to prevent falls and accidents. Talk with the hospital staff about:  Doing an assessment to discuss fall risks and create a personalized plan to prevent falls.  Checking in regularly to see if help is needed for moving around and to assess any changes in fall risk.  Knowing where the nurse call button is and how to use it. Use this to call a nursing care provider any time.  Keeping personal items within reach. This includes eyeglasses, phones, and other electronic devices.  Following general safety guidelines when moving around.  Keeping the area around the bed free from clutter.  Removing unnecessary equipment or tubes to reduce the risk of tripping.  Using safety equipment, such as: ? Walkers, crutches, and other walking devices for support. ? Safety rails on the bed. ? Safety straps in the bed. ? A bed that can be lowered and locked to prevent movement. ? Handrails in the bathroom. ? Nonskid socks and shoes. ? Locking mechanisms to secure equipment in place. ? Lifting and transfer equipment.  WHAT CAN I DO TO HELP PREVENT A FALL?  Talk with health care providers about fall prevention.  Have a personalized fall prevention plan in place.  Do not try to move around if you feel off balance or ill.  Change position slowly.  Sit on the side of the bed before standing up.  Sit down and call for help if you feel dizzy or unsteady when standing.  Keep the hospital room clear of clutter.  WHEN SHOULD I ASK FOR HELP? Ask for help whenever you:  Are not sure if you are able to move around safely.  Feel dizzy or unsteady.  Are not comfortable helping your loved one move around or use the bathroom.  If you or a loved one falls, tell the hospital staff. This is important. This information is not intended to replace advice given to you by your health care provider. Make sure you discuss any  questions you have with your health care provider. Document Released: 10/08/2000 Document Revised: 03/09/2016 Document Reviewed: 07/24/2015 Elsevier Interactive Patient Education  2017 [place] down weight bearing left leg.   Wear leg immobilizer when ambulating. It is Okay to bend your knee when lying down. Resume your Coumadin to prevent blood clots. Keep dressings on and dry until follow up with Dr. Alain Marion in about 1-2 weeks.      Information on my medicine - Coumadin   (Warfarin)  Why was Coumadin prescribed for you? Coumadin was prescribed for you because you have a blood clot or a medical condition that can cause an increased risk of forming blood clots. Blood clots can cause serious health problems by blocking the flow of blood to the heart, lung, or brain. Coumadin can prevent harmful blood clots from forming. As a reminder your indication for Coumadin is:   Stroke Prevention Because Of Atrial Fibrillation  What test will check on my response to Coumadin? While on Coumadin (warfarin) you will need to have an INR test regularly to ensure that your dose is keeping you in the desired range. The INR (international normalized ratio) number is calculated from the result of the laboratory test called prothrombin time (PT).  If an INR APPOINTMENT HAS NOT ALREADY BEEN MADE FOR YOU please schedule an appointment to have this lab work done by your health care provider within 7 days. Your INR goal is usually a number between:  2 to 3 or your provider may give you a more narrow range like 2-2.5.  Ask your health care provider during an office visit what your goal INR is.  What  do you need to  know  About  COUMADIN? Take Coumadin (warfarin) exactly as prescribed by your healthcare provider about the same time each day.  DO NOT stop taking without talking to the doctor who prescribed the medication.  Stopping without other blood clot prevention medication to take the place of  Coumadin may increase your risk of developing a new clot or stroke.  Get refills before you run out.  What do you do if you miss a dose? If you miss a dose, take it as soon as you remember on the same day then continue your regularly scheduled regimen the next day.  Do not take two doses of Coumadin at the same time.  Important Safety Information A possible side effect of Coumadin (Warfarin) is an increased risk of bleeding. You should call your healthcare provider right away if you experience any of the following: ? Bleeding from an injury or your nose that does not stop. ? Unusual colored urine (red or dark brown) or unusual colored stools (red or black). ? Unusual bruising for unknown reasons. ? A serious fall or if you hit your head (even if there is no bleeding).  Some foods or medicines interact with Coumadin (warfarin) and might  alter your response to warfarin. To help avoid this: ? Eat a balanced diet, maintaining a consistent amount of Vitamin K. ? Notify your provider about major diet changes you plan to make. ? Avoid alcohol or limit your intake to 1 drink for women and 2 drinks for men per day. (1 drink is 5 oz. wine, 12 oz. beer, or 1.5 oz. liquor.)  Make sure that ANY health care provider who prescribes medication for you knows that you are taking Coumadin (warfarin).  Also make sure the healthcare provider who is monitoring your Coumadin knows when you have started a new medication including herbals and non-prescription products.  Coumadin (Warfarin)  Major Drug Interactions  Increased Warfarin Effect Decreased Warfarin Effect  Alcohol (large quantities) Antibiotics (esp. Septra/Bactrim, Flagyl, Cipro) Amiodarone (Cordarone) Aspirin (ASA) Cimetidine (Tagamet) Megestrol (Megace) NSAIDs (ibuprofen, naproxen, etc.) Piroxicam (Feldene) Propafenone (Rythmol SR) Propranolol (Inderal) Isoniazid (INH) Posaconazole (Noxafil) Barbiturates (Phenobarbital) Carbamazepine  (Tegretol) Chlordiazepoxide (Librium) Cholestyramine (Questran) Griseofulvin Oral Contraceptives Rifampin Sucralfate (Carafate) Vitamin K   Coumadin (Warfarin) Major Herbal Interactions  Increased Warfarin Effect Decreased Warfarin Effect  Garlic Ginseng Ginkgo biloba Coenzyme Q10 Green tea [place] wort    Coumadin (Warfarin) FOOD Interactions  Eat a consistent number of servings per week of foods HIGH in Vitamin K (1 serving =  cup)  Collards (cooked, or boiled & drained) Kale (cooked, or boiled & drained) Mustard greens (cooked, or boiled & drained) Parsley *serving size only =  cup Spinach (cooked, or boiled & drained) Swiss chard (cooked, or boiled & drained) Turnip greens (cooked, or boiled & drained)  Eat a consistent number of servings per week of foods MEDIUM-HIGH in Vitamin K (1 serving = 1 cup)  Asparagus (cooked, or boiled & drained) Broccoli (cooked, boiled & drained, or raw & chopped) Brussel sprouts (cooked, or boiled & drained) *serving size only =  cup Lettuce, raw (green leaf, endive, romaine) Spinach, raw Turnip greens, raw & chopped   These websites have more information on Coumadin (warfarin):  FailFactory.se; VeganReport.com.au;

## 2018-02-03 NOTE — Progress Notes (Signed)
ANTICOAGULATION CONSULT NOTE - Initial Consult  Pharmacy Consult for Coumadin Indication: h/o CVA  No Known Allergies  Patient Measurements: Height: 4\' 6"  (137.2 cm) Weight: 130 lb (59 kg) IBW/kg (Calculated) : 31.7  Vital Signs: Temp: 98 F (36.7 C) (04/12 0525) Temp Source: Oral (04/12 0525) BP: 113/54 (04/12 0525) Pulse Rate: 75 (04/12 0525)  Labs: Recent Labs    02/01/18 1733 02/02/18 0558 02/02/18 1624 02/03/18 0617  HGB 13.0 11.3*  --  9.3*  HCT 40.0 35.2*  --  28.7*  PLT 206 190  --  138*  APTT 49* 55*  --   --   LABPROT 23.1* 21.7*  --   --   INR 2.07 1.91  --   --   CREATININE 1.21*  --  0.97 0.93    Estimated Creatinine Clearance: 38.9 mL/min (by C-G formula based on SCr of 0.93 mg/dL).   Medical History: Past Medical History:  Diagnosis Date  . Cancer ([place])    skin  . Diabetes mellitus without complication ([place])   . Hypertension   . Immune deficiency disorder ([place])   . Lupus (systemic lupus erythematosus) ([place])   . Renal disorder    Stage 2 KD  . Stroke [facility])    2009   Assessment: CC/HPI: fall   PMH: DM, h/o CVA, HTN, lupus, osteoporosis, CKD3, CHF,   Significant events: 4/11: L femur IM nail.  Anticoag: h/o CVA. Baseline Hgb 13 on 4/10. Hgb currently 9.3. INR 1.9 on 4/11. - PTA dose 5mg  Sun/Tues, 2.5mg  all other days. Admit INR 2.07  Goal of Therapy:  INR 2-3 Monitor platelets by anticoagulation protocol: Yes   Plan:  Lovenox 30mg /24h Coumadin 6mg  po x 1 tonight Daily INR  Alvie Fowles S. Alford Highland, PharmD, BCPS Clinical Staff Pharmacist Pager [phone]  Eilene Ghazi Stillinger 02/03/2018,1:37 PM

## 2018-02-03 NOTE — Evaluation (Signed)
Occupational Therapy Evaluation Patient Details Name: Rhonda Contreras MRN: [id number] DOB: 17-Jun-1951 Today's Date: 02/03/2018    History of Present Illness 67 y.o. female admitted on 02/01/18 s/p fall with L femur fx s/p ORIF with post op TDWB status.  Pt with significant PMH of stroke in 2009 with mild residual R sided weakness, renal disorder, lupus, immune difficiency disorder, HTN, DM, and congenital deformity L thumb.     Clinical Impression   Patient is s/p ORIF L femur surgery resulting in functional limitations due to the deficits listed below (see OT problem list). PTA was mod I with all adls and walked dog daily. Pt currently requires Min (A) with RW for basic transfers and total (A) for LB dressing. Pt will benefit from AE education next session.  Patient will benefit from skilled OT acutely to increase independence and safety with ADLS to allow discharge SNF.     Follow Up Recommendations  SNF    Equipment Recommendations  3 in 1 bedside commode;Other (comment)(rw)    Recommendations for Other Services       Precautions / Restrictions Precautions Precautions: Fall Precaution Comments: fall outside last year Restrictions Weight Bearing Restrictions: Yes      Mobility Bed Mobility Overal bed mobility: Needs Assistance Bed Mobility: Supine to Sit     Supine to sit: Min guard;HOB elevated     General bed mobility comments: Min guard assist to help progress left leg over EOB.  HOB elevated and pt using 1/2 bridge technique.  Transfers Overall transfer level: Needs assistance Equipment used: Rolling walker (2 wheeled) Transfers: Sit to/from Stand Sit to Stand: Min guard         General transfer comment: Min guard assist at trunk to stabilize for balance, verbal cues for TDWB left foot both prior to and during mobility.     Balance Overall balance assessment: Needs assistance Sitting-balance support: Feet supported;No upper extremity supported Sitting  balance-Leahy Scale: Good     Standing balance support: Bilateral upper extremity supported Standing balance-Leahy Scale: Poor Standing balance comment: needs RW for balance due to TDWB L                            ADL either performed or assessed with clinical judgement   ADL Overall ADL's : Needs assistance/impaired Eating/Feeding: Independent   Grooming: Set up;Sitting Grooming Details (indicate cue type and reason): unable to perform in static standing at thist ime  Upper Body Bathing: Set up   Lower Body Bathing: Maximal assistance Lower Body Bathing Details (indicate cue type and reason): pt could benefit from AE EDUCATION Upper Body Dressing : Set up   Lower Body Dressing: Maximal assistance Lower Body Dressing Details (indicate cue type and reason): to don socks Toilet Transfer: RW;Minimal assistance           Functional mobility during ADLs: Rolling walker;Minimal assistance General ADL Comments: pt educated on precautions and overall ADLs with these precautions.      Vision Baseline Vision/History: Wears glasses Wears Glasses: Reading only Additional Comments: s/p cataract surg     Perception     Praxis      Pertinent Vitals/Pain Pain Assessment: 0-10 Pain Score: 2  Pain Location: L shin Pain Descriptors / Indicators: Aching;Burning Pain Intervention(s): Monitored during session;Repositioned;Limited activity within patient's tolerance     Hand Dominance Left(after stroke)   Extremity/Trunk Assessment Upper Extremity Assessment Upper Extremity Assessment: LUE deficits/detail LUE Deficits / Details: no  L thumb at baseline since birth       Cervical / Trunk Assessment Cervical / Trunk Assessment: Normal   Communication Communication Communication: No difficulties   Cognition Arousal/Alertness: Awake/alert Behavior During Therapy: WFL for tasks assessed/performed Overall Cognitive Status: Within Functional Limits for tasks assessed                                      General Comments  dressing dry and intact       Shoulder Instructions      Home Living Family/patient expects to be discharged to:: Skilled nursing facility Living Arrangements: Alone                               Additional Comments: called sister when pt fall. sister came to help . pt has a niece nearby "Rhonda Contreras"       Prior Functioning/Environment Level of Independence: Independent        Comments: does not drive/ goes to the store every 2 weeks. friends pick up and take her places. Walk to [facility] near apartmentl. Has a small dog she walks daily        OT Problem List: Decreased strength;Decreased range of motion;Decreased activity tolerance;Impaired balance (sitting and/or standing);Decreased safety awareness;Decreased knowledge of precautions;Decreased knowledge of use of DME or AE;Pain      OT Treatment/Interventions: Self-care/ADL training;Therapeutic exercise;Energy conservation;DME and/or AE instruction;Therapeutic activities;Patient/family education;Balance training    OT Goals(Current goals can be found in the care plan section) Acute Rehab OT Goals Patient Stated Goal: to get strong enough to get hom to her dog, Rhonda Contreras OT Goal Formulation: With patient Time For Goal Achievement: 02/17/18 Potential to Achieve Goals: Good  OT Frequency: Min 2X/week   Barriers to D/C: Decreased caregiver support          Co-evaluation PT/OT/SLP Co-Evaluation/Treatment: Yes Reason for Co-Treatment: For patient/therapist safety;To address functional/ADL transfers   OT goals addressed during session: ADL's and self-care;Proper use of Adaptive equipment and DME;Strengthening/ROM      AM-PAC PT "6 Clicks" Daily Activity     Outcome Measure Help from another person eating meals?: None Help from another person taking care of personal grooming?: A Little Help from another person toileting, which includes using toliet,  bedpan, or urinal?: A Little Help from another person bathing (including washing, rinsing, drying)?: A Little Help from another person to put on and taking off regular upper body clothing?: A Little Help from another person to put on and taking off regular lower body clothing?: A Lot 6 Click Score: 18   End of Session Equipment Utilized During Treatment: Gait belt;Rolling walker Nurse Communication: Mobility status;Precautions;Weight bearing status  Activity Tolerance: Patient tolerated treatment well Patient left: in chair;with call bell/phone within reach  OT Visit Diagnosis: Unsteadiness on feet (R26.81);Muscle weakness (generalized) (M62.81)                Time: 5400-8676 OT Time Calculation (min): 35 min Charges:  OT General Charges $OT Visit: 1 Visit OT Evaluation $OT Eval Moderate Complexity: 1 Mod G-Codes:      Jeri Modena   OTR/L Pager: [phone] Office: [phone] .   Parke Poisson B 02/03/2018, 3:56 PM

## 2018-02-03 NOTE — Progress Notes (Signed)
02/03/2018 Pt was able to get up and OOB to chair as well as ambulate a very short distance maintaining TDWB.  She lives alone and will need SNF level rehab before returning home alone.   PT to follow acutely for deficits listed below.    Barbarann Ehlers Ajeet Casasola, PT, DPT [phone]    02/03/18 1400  PT Evaluation Information  Last PT Received On 02/03/18  Assistance Needed +1  Reason for Co-Treatment For patient/therapist safety;To address functional/ADL transfers  OT goals addressed during session ADL's and self-care;Proper use of Adaptive equipment and DME;Strengthening/ROM  History of Present Illness 67 y.o. female admitted on 02/01/18 s/p fall with L femur fx s/p ORIF with post op TDWB status.  Pt with significant PMH of stroke in 2009 with mild residual R sided weakness, renal disorder, lupus, immune difficiency disorder, HTN, DM, and congenital deformity L thumb.    Precautions  Precautions Fall  Precaution Comments fall outside last year  Restrictions  Weight Bearing Restrictions Yes  Home Living  Family/patient expects to be discharged to: [place] Alone  Additional Comments called sister when pt fall. sister came to help . pt has a niece nearby "[name]"   Prior Function  Level of Independence Independent  Comments does not drive/ goes to the store every 2 weeks. friends pick up and take her places. Walk to [facility] near apartmentl. Has a small dog she walks daily  Communication  Communication No difficulties  Pain Assessment  Pain Assessment 0-10  Pain Score 2  Pain Location L shin  Pain Descriptors / Indicators Aching;Burning  Pain Intervention(s) Monitored during session;Repositioned;Limited activity within patient's tolerance  Cognition  Arousal/Alertness Awake/alert  Behavior During Therapy WFL for tasks assessed/performed  Overall Cognitive Status Within Functional Limits for tasks assessed  Upper Extremity Assessment  Upper Extremity  Assessment LUE deficits/detail  LUE Deficits / Details no L thumb at baseline since birth   Lower Extremity Assessment  Lower Extremity Assessment LLE deficits/detail  LLE Deficits / Details left leg with normal post op pain and weakness, ankle 3/5, knee 3-/5, hip 3-/5  Cervical / Trunk Assessment  Cervical / Trunk Assessment Normal  Bed Mobility  Overal bed mobility Needs Assistance  Bed Mobility Supine to Sit  Supine to sit Min guard;HOB elevated  General bed mobility comments Min guard assist to help progress left leg over EOB.  HOB elevated and pt using 1/2 bridge technique.  Transfers  Overall transfer level Needs assistance  Equipment used Rolling walker (2 wheeled)  Transfers Sit to/from Stand  Sit to Stand Min guard  General transfer comment Min guard assist at trunk to stabilize for balance, verbal cues for TDWB left foot both prior to and during mobility.   Ambulation/Gait  Ambulation/Gait assistance Min assist  Ambulation Distance (Feet) 5 Feet  Assistive device Rolling walker (2 wheeled)  Gait Pattern/deviations Step-to pattern (hop to)  General Gait Details Pt able to maintain TDWB and take a few hop steps forward with only toe touching the floor.  Chair to follow for increased safety.   Balance  Overall balance assessment Needs assistance  Sitting-balance support Feet supported;No upper extremity supported  Sitting balance-Leahy Scale Good  Standing balance support Bilateral upper extremity supported  Standing balance-Leahy Scale Poor  Standing balance comment needs RW for balance due to TDWB L   General Comments  General comments (skin integrity, edema, etc.) dressing dry and intact  Exercises  Exercises Total Joint  Total Joint Exercises  Ankle Circles/Pumps AROM;Both;20 reps  Quad Sets AROM;Left;10 reps  Heel Slides AAROM;Left;10 reps  Hip ABduction/ADduction AAROM;Left;10 reps  Long Arc Quad AROM;Left;10 reps  PT - End of Session  Equipment Utilized During  Treatment Gait belt  Activity Tolerance Patient limited by pain  Patient left in chair;with call bell/phone within reach  Nurse Communication Mobility status (to RN tech)  PT Assessment  PT Recommendation/Assessment Patient needs continued PT services  PT Visit Diagnosis Muscle weakness (generalized) (M62.81);Difficulty in walking, not elsewhere classified (R26.2);Pain  Pain - Right/Left Left  Pain - part of body Leg  PT Problem List Decreased strength;Decreased range of motion;Decreased activity tolerance;Decreased balance;Decreased mobility;Decreased knowledge of use of DME;Decreased knowledge of precautions;Pain  Barriers to Discharge Decreased caregiver support  Barriers to Discharge Comments pt lives alone  PT Plan  PT Frequency (ACUTE ONLY) Min 3X/week  PT Treatment/Interventions (ACUTE ONLY) DME instruction;Gait training;Stair training;Functional mobility training;Therapeutic activities;Therapeutic exercise;Balance training;Patient/family education;Manual techniques;Modalities  AM-PAC PT "6 Clicks" Daily Activity Outcome Measure  Difficulty turning over in bed (including adjusting bedclothes, sheets and blankets)? 1  Difficulty moving from lying on back to sitting on the side of the bed?  1  Difficulty sitting down on and standing up from a chair with arms (e.g., wheelchair, bedside commode, etc,.)? 1  Help needed moving to and from a bed to chair (including a wheelchair)? 3  Help needed walking in hospital room? 3  Help needed climbing 3-5 steps with a railing?  1  6 Click Score 10  Mobility G Code  CL  PT Recommendation  Follow Up Recommendations SNF  PT equipment Rolling walker with 5" wheels  Individuals Consulted  Consulted and Agree with Results and Recommendations Patient  Acute Rehab PT Goals  Patient Stated Goal to get strong enough to get hom to her dog, [name]  PT Goal Formulation With patient  Time For Goal Achievement 02/10/18  Potential to Achieve Goals Good   PT Time Calculation  PT Start Time (ACUTE ONLY) 1427  PT Stop Time (ACUTE ONLY) 1507  PT Time Calculation (min) (ACUTE ONLY) 40 min  PT General Charges  $$ ACUTE PT VISIT 1 Visit  PT Evaluation  $PT Eval Moderate Complexity 1 Mod  PT Treatments  $Therapeutic Activity 8-22 mins  Written Expression  Dominant Hand Left (after stroke)

## 2018-02-03 NOTE — Progress Notes (Addendum)
PROGRESS NOTE                                                                                                                                                                                                             Patient Demographics:    Rhonda Contreras, is a 67 y.o. female, DOB - 19-Feb-1951, IRJ:[id number]  Admit date - 02/01/2018   Admitting Physician Norval Morton, MD  Outpatient Primary MD for the patient is Wenda Low, MD  LOS - 2  Outpatient Specialists: None  Chief Complaint  Patient presents with  . Fall       Brief Narrative   67 year old female with history of hypertension, lupus, type 2 diabetes mellitus, CVA on chronic anticoagulation with Coumadin, osteoporosis presented with mechanical fall at home and sustained a displaced spiral oblique fracture of the left femoral diaphysis.  Patient taken to the OR today and underwent left femur intramedullary nail placement.   Subjective:      Assessment  & Plan :    Principal Problem: Left femur fracture, subsequent encounter ([place]) Secondary to mechanical fall.  Underwent left femur intramedullary nail placement.  Up with therapy.  Resume Coumadin.  DC Foley. PRN hydrocodone for pain.  Robaxin for muscle spasms.  Scheduled bowel regimen. PT evaluation pending.  Patient lives alone and should benefit from SNF.   Active Problems: Acute leukocytosis Suspect reactive.  No signs of infection.  Resolved and subsequent lab.     Diabetes mellitus type 2 in nonobese [facility] [place]) Well-controlled on oral medications.  Elevated CBG on admission likely due to stress.  Holding Amaryl and metformin and monitoring with sliding scale coverage.  History of CVA on chronic anticoagulation Has residual mild right-sided weakness.  INR therapeutic on admission.  Coumadin held for surgery and will be resumed today.  Essential hypertension Resume home medications.  Blood  pressure stable.  Chronic kidney disease stage III Renal function stable.  ?Chronic diastolic CHF Patient is euvolemic.  Monitor for now.  Osteoporosis Alendronate as outpatient  Postoperative anemia Monitor.   Code Status : Full code  Family Communication  : Sister at bedside  Disposition Plan  : SNF possibly tomorrow  Barriers For Discharge : Postop  Consults  : Dr. Percell Miller  Procedures  : Left femur IM nail  DVT Prophylaxis  : Therapeutic on Coumadin  Lab Results  Component Value Date   PLT 138 (L) 02/03/2018    Antibiotics  :   Anti-infectives (From admission, onward)   Start     Dose/Rate Route Frequency Ordered Stop   02/02/18 1800  ceFAZolin (ANCEF) IVPB 2g/100 mL premix     2 g 200 mL/hr over 30 Minutes Intravenous Every 6 hours 02/02/18 1618 02/03/18 0155   02/02/18 1254  ceFAZolin (ANCEF) 2-4 GM/100ML-% IVPB    Note to Pharmacy:  Starleen Arms   : cabinet override      02/02/18 1254 02/03/18 0059   02/02/18 1254  ceFAZolin (ANCEF) 2-4 GM/100ML-% IVPB  Status:  Discontinued    Note to Pharmacy:  Lisette Grinder   : cabinet override      02/02/18 1254 02/02/18 1328        Objective:   Vitals:   02/02/18 2002 02/03/18 0011 02/03/18 0525 02/03/18 0928  BP: 125/70 (!) 105/51 (!) 113/54   Pulse: 89 80 75   Resp: 12  12   Temp: 99.2 F (37.3 C) 98.9 F (37.2 C) 98 F (36.7 C)   TempSrc: Oral Oral Oral   SpO2: 94% 91% 96% 97%  Weight:      Height:        Wt Readings from Last 3 Encounters:  02/01/18 59 kg (130 lb)     Intake/Output Summary (Last 24 hours) at 02/03/2018 1308 Last data filed at 02/03/2018 0900 Gross per 24 hour  Intake 2973.33 ml  Output 1650 ml  Net 1323.33 ml     Physical Exam General: Elderly female not in distress HEENT: Moist mucosa, supple neck Chest: Clear bilaterally CVS: Normal S1 and S2, no murmurs GI: Soft, nondistended, nontender Musculoskeletal: Warm, Ace wrap over left femur, Foley+     Data  Review:    CBC Recent Labs  Lab 02/01/18 1733 02/02/18 0558 02/03/18 0617  WBC 17.0* 8.9 7.5  HGB 13.0 11.3* 9.3*  HCT 40.0 35.2* 28.7*  PLT 206 190 138*  MCV 93.5 93.6 94.4  MCH 30.4 30.1 30.6  MCHC 32.5 32.1 32.4  RDW 14.0 14.1 14.3  LYMPHSABS 1.5  --   --   MONOABS 0.7  --   --   EOSABS 0.1  --   --   BASOSABS 0.0  --   --     Chemistries  Recent Labs  Lab 02/01/18 1733 02/02/18 1624 02/03/18 0617  NA 140  --  139  K 4.0  --  3.6  CL 106  --  107  CO2 22  --  24  GLUCOSE 234*  --  128*  BUN 24*  --  16  CREATININE 1.21* 0.97 0.93  CALCIUM 9.5  --  8.1*   ------------------------------------------------------------------------------------------------------------------ No results for input(s): CHOL, HDL, LDLCALC, TRIG, CHOLHDL, LDLDIRECT in the last 72 hours.  Lab Results  Component Value Date   HGBA1C (H) 05/15/2008    9.0 (NOTE)   The ADA recommends the following therapeutic goal for glycemic   control related to Hgb A1C measurement:   Goal of Therapy:   < 7.0% Hgb A1C   Reference: American Diabetes Association: Clinical Practice   Recommendations 2008, Diabetes Care,  2008, 31:(Suppl 1).   ------------------------------------------------------------------------------------------------------------------ No results for input(s): TSH, T4TOTAL, T3FREE, THYROIDAB in the last 72 hours.  Invalid input(s): FREET3 ------------------------------------------------------------------------------------------------------------------ No results for input(s): VITAMINB12, FOLATE, FERRITIN, TIBC, IRON, RETICCTPCT in the last 72 hours.  Coagulation profile Recent Labs  Lab 02/01/18 1733 02/02/18 7062  INR 2.07 1.91    No results for input(s): DDIMER in the last 72 hours.  Cardiac Enzymes No results for input(s): CKMB, TROPONINI, MYOGLOBIN in the last 168 hours.  Invalid input(s):  CK ------------------------------------------------------------------------------------------------------------------ No results found for: BNP  Inpatient Medications  Scheduled Meds: . amLODipine  10 mg Oral Daily  . atorvastatin  20 mg Oral Daily  . docusate sodium  100 mg Oral BID  . enoxaparin (LOVENOX) injection  30 mg Subcutaneous Q24H  . insulin aspart  0-5 Units Subcutaneous QHS  . insulin aspart  0-9 Units Subcutaneous TID WC  . ketoconazole  1 application Topical Daily  . metoprolol tartrate  100 mg Oral BID  . senna  1 tablet Oral Daily   Continuous Infusions: . lactated ringers 100 mL/hr at 02/02/18 1733  . methocarbamol (ROBAXIN)  IV     PRN Meds:.acetaminophen, bisacodyl, HYDROcodone-acetaminophen, methocarbamol **OR** methocarbamol (ROBAXIN)  IV, metoCLOPramide **OR** metoCLOPramide (REGLAN) injection, morphine injection, ondansetron **OR** ondansetron (ZOFRAN) IV, polyethylene glycol, sodium phosphate  Micro Results Recent Results (from the past 240 hour(s))  Surgical pcr screen     Status: None   Collection Time: 02/01/18 10:01 PM  Result Value Ref Range Status   MRSA, PCR NEGATIVE NEGATIVE Final   Staphylococcus aureus NEGATIVE NEGATIVE Final    Comment: (NOTE) The Xpert SA Assay (FDA approved for NASAL specimens in patients 28 years of age and older), is one component of a comprehensive surveillance program. It is not intended to diagnose infection nor to guide or monitor treatment. Performed at [facility], [place] [address], [place], [place] [address]     Radiology Reports Dg Chest 1 View  Result Date: 02/01/2018 CLINICAL DATA:  Preop chest for left femoral fracture. EXAM: CHEST  1 VIEW COMPARISON:  None. FINDINGS: Cardiomegaly with ectatic thoracic aorta. Atelectasis noted at each lung base. No pulmonary edema or focal pulmonary consolidation. Remote left-sided rib fractures. No acute osseous abnormality of the bony thorax. IMPRESSION: Cardiomegaly  without active pulmonary disease. Electronically Signed   By: Ashley Royalty M.D.   On: 02/01/2018 18:59   Dg Pelvis 1-2 Views  Result Date: 02/01/2018 CLINICAL DATA:  Left hip pain after slip and fall in bathtub today. EXAM: PELVIS - 1-2 VIEW COMPARISON:  None. FINDINGS: There is no evidence of pelvic fracture or diastasis. Femoral heads are seated within their acetabular components. No pelvic bone lesions are seen. Left L5-S1 assimilation joint. 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 IMPRESSION: 1. No acute fracture of the pelvis. 2. 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 3. L5-S1 left-sided assimilation joint. Electronically Signed   By: Ashley Royalty M.D.   On: 02/01/2018 18:46   Dg Tibia/fibula Left  Result Date: 02/01/2018 CLINICAL DATA:  Left leg pain after slip in the bathroom today. EXAM: LEFT TIBIA AND FIBULA - 2 VIEW COMPARISON:  None. FINDINGS: Partially visualized femoral diaphyseal fracture with lateral displacement of the distal fracture fragment. Tibial arteriosclerosis is noted. Mild joint space narrowing of the femorotibial compartment. No joint dislocation at the knee nor ankle joint. Subtalar joint is maintained as are the midfoot articulations. Soft tissues are unremarkable. IMPRESSION: Partially included femoral diaphyseal fracture with lateral displacement 1 shaft width of the distal fracture fragment. No tibial nor fibular fracture. Electronically Signed   By: Ashley Royalty M.D.   On: 02/01/2018 18:50   Dg C-arm 1-60 Min  Result Date: 02/02/2018 CLINICAL DATA:  LEFT FEMUR INTRAMEDULLARY (IM) NAIL  For Left Femur Fracture. Fluoro Time is 1 min 5 secs. EXAM: LEFT FEMUR 2 VIEWS; DG C-ARM 61-120 MIN COMPARISON:  02/01/2018 FINDINGS: The displaced spiral fracture of mid to distal left femoral shaft has been reduced with an intramedullary rod. The fracture is in near anatomic alignment. The  orthopedic hardware appears well seated. IMPRESSION: Well-aligned left femur fracture following ORIF. Electronically Signed   By: Lajean Manes M.D.   On: 02/02/2018 15:34   Dg Femur Min 2 Views Left  Result Date: 02/02/2018 CLINICAL DATA:  LEFT FEMUR INTRAMEDULLARY (IM) NAIL For Left Femur Fracture. Fluoro Time is 1 min 5 secs. EXAM: LEFT FEMUR 2 VIEWS; DG C-ARM 61-120 MIN COMPARISON:  02/01/2018 FINDINGS: The displaced spiral fracture of mid to distal left femoral shaft has been reduced with an intramedullary rod. The fracture is in near anatomic alignment. The orthopedic hardware appears well seated. IMPRESSION: Well-aligned left femur fracture following ORIF. Electronically Signed   By: Lajean Manes M.D.   On: 02/02/2018 15:34   Dg Femur Min 2 Views Left  Result Date: 02/01/2018 CLINICAL DATA:  Left leg pain after fall in bathroom today. EXAM: LEFT FEMUR 2 VIEWS COMPARISON:  None. FINDINGS: There is an acute spiral oblique fracture of the left femoral diaphysis with 1 shaft width lateral and anterior displacement of the distal fracture fragment as well as 90 degrees of external rotation and 4 cm of foreshortening. No intra-articular extension is identified. No joint dislocation at the hip or knee. IMPRESSION: Acute spiral oblique fracture of the left femoral diaphysis with 1 shaft with anterior and lateral displacement of the distal fracture fragment with 90 degrees of external rotation and 4 cm of foreshortening/override. Electronically Signed   By: Ashley Royalty M.D.   On: 02/01/2018 18:53   Dg Femur Port Min 2 Views Left  Result Date: 02/02/2018 CLINICAL DATA:  Left femur fracture ORIF. EXAM: LEFT FEMUR PORTABLE 2 VIEWS COMPARISON:  Left femur x-rays from yesterday. FINDINGS: Near anatomic alignment of the distal femoral diaphyseal spiral fracture status post intramedullary nail placement. No evidence of hardware complication. No new acute abnormality. IMPRESSION: Near anatomic alignment of the  distal femur fracture status post ORIF. Electronically Signed   By: Titus Dubin M.D.   On: 02/02/2018 19:59    Time Spent in minutes  25   Jeptha Hinnenkamp M.D on 02/03/2018 at 1:08 PM  Between 7am to 7pm - Pager - [phone]  After 7pm go to [email or web address] - password [facility]  [facility] -  Office  [phone]

## 2018-02-04 DIAGNOSIS — I10 Essential (primary) hypertension: Secondary | ICD-10-CM

## 2018-02-04 DIAGNOSIS — Z7901 Long term (current) use of anticoagulants: Secondary | ICD-10-CM

## 2018-02-04 DIAGNOSIS — S72332A Displaced oblique fracture of shaft of left femur, initial encounter for closed fracture: Secondary | ICD-10-CM

## 2018-02-04 DIAGNOSIS — W19XXXA Unspecified fall, initial encounter: Secondary | ICD-10-CM

## 2018-02-04 DIAGNOSIS — S728X2A Other fracture of left femur, initial encounter for closed fracture: Secondary | ICD-10-CM

## 2018-02-04 LAB — GLUCOSE, CAPILLARY
Glucose-Capillary: 159 mg/dL — ABNORMAL HIGH (ref 65–99)
Glucose-Capillary: 170 mg/dL — ABNORMAL HIGH (ref 65–99)
Glucose-Capillary: 186 mg/dL — ABNORMAL HIGH (ref 65–99)

## 2018-02-04 LAB — PROTIME-INR
INR: 1.68
Prothrombin Time: 19.6 seconds — ABNORMAL HIGH (ref 11.4–15.2)

## 2018-02-04 MED ORDER — WARFARIN SODIUM 3 MG PO TABS
3.0000 mg | ORAL_TABLET | Freq: Once | ORAL | Status: AC
  Administered 2018-02-04: 3 mg via ORAL
  Filled 2018-02-04: qty 1

## 2018-02-04 NOTE — Progress Notes (Addendum)
Patient ID: Rhonda Contreras, female   DOB: [birth year], 67 y.o.   MRN: [id number]     Subjective:  Patient reports pain as mild to moderate.  Patient in bed and in no acute distress  Objective:   VITALS:   Vitals:   02/03/18 1421 02/03/18 2048 02/04/18 0403 02/04/18 1217  BP: 116/63 117/70 (!) 103/59 120/60  Pulse: 79 (!) 102 90 79  Resp: 18 17 12 18   Temp: 99.9 F (37.7 C) 98.2 F (36.8 C) 97.6 F (36.4 C) 98.2 F (36.8 C)  TempSrc: Oral Oral Oral Oral  SpO2: 94% 95% 92% 95%  Weight:      Height:        ABD soft Sensation intact distally Dorsiflexion/Plantar flexion intact Incision: dressing C/D/I and no drainage   Lab Results  Component Value Date   WBC 7.5 02/03/2018   HGB 9.3 (L) 02/03/2018   HCT 28.7 (L) 02/03/2018   MCV 94.4 02/03/2018   PLT 138 (L) 02/03/2018   BMET    Component Value Date/Time   NA 139 02/03/2018 0617   K 3.6 02/03/2018 0617   CL 107 02/03/2018 0617   CO2 24 02/03/2018 0617   GLUCOSE 128 (H) 02/03/2018 0617   BUN 16 02/03/2018 0617   CREATININE 0.93 02/03/2018 0617   CALCIUM 8.1 (L) 02/03/2018 0617   GFRNONAA >60 02/03/2018 0617   GFRAA >60 02/03/2018 0617     Assessment/Plan: 2 Days Post-Op   Principal Problem:   Other fracture of left femur, initial encounter for closed fracture ([place]) Active Problems:   Diabetes mellitus type 2 in nonobese (H[facility])   Chronic anticoagulation   Essential hypertension   History of CVA (cerebrovascular accident)   Advance diet Up with therapy Plan for SNF Continue plan per medicine TDWB LLE    DOUGLAS PARRY, BRANDON 02/04/2018, 4:00 PM  Discussed and agree with above.   Marchia Bond, MD Cell [phone]

## 2018-02-04 NOTE — Clinical Social Work Note (Signed)
Clinical Social Work Assessment  Patient Details  Name: Rhonda Contreras MRN: [id number] Date of Birth: 07-Feb-1951  Date of referral:  02/04/18               Reason for consult:  Facility Placement                Permission sought to share information with:  Facility Sport and exercise psychologist, Family Supports Permission granted to share information::  Yes, Verbal Permission Granted  Name::     Engineer, civil (consulting)::  SNFs  Relationship::  Sister  Contact Information:  [phone]  Housing/Transportation Living arrangements for the past 2 months:  Single Family Home Source of Information:  Patient Patient Interpreter Needed:  None Criminal Activity/Legal Involvement Pertinent to Current Situation/Hospitalization:  No - Comment as needed Significant Relationships:  Siblings Lives with:  Self Do you feel safe going back to the place where you live?  No Need for family participation in patient care:  Yes (Comment)  Care giving concerns:  CSW received consult for possible SNF placement at time of discharge. CSW spoke with patient regarding PT recommendation of SNF placement at time of discharge. Therapy also in the room working with patient. Patient expressed understanding of PT recommendation and is agreeable to SNF placement at time of discharge. She does not have enough help at home to return there at this time. CSW to continue to follow and assist with discharge planning needs.   Social Worker assessment / plan:  CSW spoke with patient concerning possibility of rehab at [facility] before returning home.  Employment status:  Retired Nurse, adult PT Recommendations:  [place] / Referral to community resources:  [place]  Patient/Family's Response to care:  Patient recognizes need for rehab before returning home and is agreeable to a SNF in [place]. Patient requested that CSW provide offers to her sister, Rhonda Contreras. CSW explained  potential barriers such as requiring insurance pre-authorization and not many facilities being in network with [employer]. Patient reported understanding.   Patient/Family's Understanding of and Emotional Response to Diagnosis, Current Treatment, and Prognosis:  Patient/family is realistic regarding therapy needs and expressed being hopeful for SNF placement. Patient expressed understanding of CSW role and discharge process as well as medical condition. No questions/concerns about plan or treatment.    Emotional Assessment Appearance:  Appears stated age Attitude/Demeanor/Rapport:  Engaged, Gracious Affect (typically observed):  Accepting, Appropriate, Pleasant Orientation:  Oriented to Self, Oriented to Situation, Oriented to Place, Oriented to  Time Alcohol / Substance use:  Not Applicable Psych involvement (Current and /or in the community):  No (Comment)  Discharge Needs  Concerns to be addressed:  Care Coordination Readmission within the last 30 days:  No Current discharge risk:  Dependent with Mobility Barriers to Discharge:  Continued Medical Work up   [employer], [place] 02/04/2018, 4:04 PM

## 2018-02-04 NOTE — Progress Notes (Signed)
ANTICOAGULATION CONSULT NOTE - Follow Up Consult  Pharmacy Consult for Coumadin Indication: History of CVA  No Known Allergies  Patient Measurements: Height: 4\' 6"  (137.2 cm) Weight: 130 lb (59 kg) IBW/kg (Calculated) : 31.7  Vital Signs: Temp: 97.6 F (36.4 C) (04/13 0403) Temp Source: Oral (04/13 0403) BP: 103/59 (04/13 0403) Pulse Rate: 90 (04/13 0403)  Labs: Recent Labs    02/01/18 1733 02/02/18 0558 02/02/18 1624 02/03/18 0617 02/04/18 0544  HGB 13.0 11.3*  --  9.3*  --   HCT 40.0 35.2*  --  28.7*  --   PLT 206 190  --  138*  --   APTT 49* 55*  --   --   --   LABPROT 23.1* 21.7*  --   --  19.6*  INR 2.07 1.91  --   --  1.68  CREATININE 1.21*  --  0.97 0.93  --     Estimated Creatinine Clearance: 38.9 mL/min (by C-G formula based on SCr of 0.93 mg/dL).  Assessment: 67 year old female on Coumadin prior to admission for history of a CVA (2009) who was admitted 4/10 s/p fall and femur fracture s/p IM nailing on 4/11. Coumadin was held 4/10 and 4/11. Last dose prior to admission was on 4/9. INR on admission was therapeutic at 2.07 on home dose of 5mg  on Sundays and Tuesdays and 2.5mg  all other days.   INR today has dropped to 1.68 after 6 mg dose yesterday. This is due to 2 days of missed therapy. Patient remains on Lovenox DVT prophylaxis dosing at this time. No bleeding reported. CBC was not repeated today. Suspect will see rise in INR tomorrow after larger 6 mg dose.   Goal of Therapy:  INR 2-3 Monitor platelets by anticoagulation protocol: Yes   Plan:  Continue Lovenox 30 mg SQ daily Coumadin 3mg  po x1 tonight Daily PT/INR  Sloan Leiter, PharmD, BCPS, BCCCP Clinical Pharmacist Clinical phone 02/04/2018 until 3:30PM- #[id number] After hours, please call [phone] 02/04/2018,9:38 AM

## 2018-02-04 NOTE — NC FL2 (Signed)
[place] LEVEL OF CARE SCREENING TOOL     IDENTIFICATION  Patient Name: Rhonda Contreras Birthdate: 10-30-1950 Sex: female Admission Date (Current Location): 02/01/2018  [facility] [place] Number:  Herbalist and Address:  [facility] [place] [place] [address] [place], [place] [address]      Provider Number: [id number]  Attending Physician Name and Address:  Bonnell Public, MD  Relative Name and Phone Number:  [name] sister, [phone]    Current Level of Care: Hospital Recommended Level of Care: [place] Prior Approval Number:    Date Approved/Denied:   PASRR Number: [id number]  Discharge Plan: SNF    Current Diagnoses: Patient Active Problem List   Diagnosis Date Noted  . Diabetes mellitus type 2 in nonobese ([place]) 02/02/2018  . Chronic anticoagulation 02/02/2018  . Essential hypertension 02/02/2018  . History of CVA (cerebrovascular accident) 02/02/2018  . Other fracture of left femur, initial encounter for closed fracture ([place]) 02/01/2018    Orientation RESPIRATION BLADDER Height & Weight     Time, Situation, Self, Place  Normal Continent Weight: 59 kg (130 lb) Height:  4\' 6"  (137.2 cm)  BEHAVIORAL SYMPTOMS/MOOD NEUROLOGICAL BOWEL NUTRITION STATUS      Continent Diet(Please see DC Summary)  AMBULATORY STATUS COMMUNICATION OF NEEDS Skin   Limited Assist Verbally Surgical wounds(Closed incision on leg)                       Personal Care Assistance Level of Assistance  Bathing, Feeding, Dressing Bathing Assistance: Limited assistance Feeding assistance: Independent Dressing Assistance: Limited assistance     Functional Limitations Info  Sight, Hearing, Speech Sight Info: Adequate Hearing Info: Adequate Speech Info: Adequate    SPECIAL CARE FACTORS FREQUENCY  PT (By licensed PT), OT (By licensed OT)     PT Frequency: 5x/week OT Frequency: 3x/week            Contractures       Additional Factors Info  Code Status, Allergies, Insulin Sliding Scale Code Status Info: Full Allergies Info: NKA   Insulin Sliding Scale Info: 3x daily with meals and at bedtime       Current Medications (02/04/2018):  This is the current hospital active medication list Current Facility-Administered Medications  Medication Dose Route Frequency Provider Last Rate Last Dose  . acetaminophen (TYLENOL) tablet 325-650 mg  325-650 mg Oral Q6H PRN Prudencio Burly III, PA-C      . amLODipine (NORVASC) tablet 10 mg  10 mg Oral Daily Prudencio Burly III, PA-C   10 mg at 02/04/18 0840  . atorvastatin (LIPITOR) tablet 20 mg  20 mg Oral Daily Prudencio Burly III, PA-C   20 mg at 02/04/18 0840  . bisacodyl (DULCOLAX) suppository 10 mg  10 mg Rectal Daily PRN Prudencio Burly III, PA-C      . docusate sodium (COLACE) capsule 100 mg  100 mg Oral BID Prudencio Burly III, PA-C   100 mg at 02/04/18 0840  . enoxaparin (LOVENOX) injection 30 mg  30 mg Subcutaneous Q24H Prudencio Burly III, PA-C   30 mg at 02/04/18 0841  . HYDROcodone-acetaminophen (NORCO/VICODIN) 5-325 MG per tablet 1-2 tablet  1-2 tablet Oral Q4H PRN Prudencio Burly III, PA-C   1 tablet at 02/04/18 [phone]  . insulin aspart (novoLOG) injection 0-5 Units  0-5 Units Subcutaneous QHS Prudencio Burly III, PA-C   2 Units at 02/01/18 2228  .  insulin aspart (novoLOG) injection 0-9 Units  0-9 Units Subcutaneous TID WC Prudencio Burly III, PA-C   2 Units at 02/04/18 1132  . ketoconazole (NIZORAL) 2 % cream 1 application  1 application Topical Daily Martensen, Charna Elizabeth III, PA-C      . lactated ringers infusion   Intravenous Continuous Prudencio Burly III, PA-C   Stopped at 02/03/18 1500  . methocarbamol (ROBAXIN) tablet 500 mg  500 mg Oral Q6H PRN Prudencio Burly III, PA-C   500 mg at 02/04/18 0786   Or  . methocarbamol (ROBAXIN) 500 mg in dextrose 5 % 50 mL  IVPB  500 mg Intravenous Q6H PRN Prudencio Burly III, PA-C      . metoCLOPramide (REGLAN) tablet 5-10 mg  5-10 mg Oral Q8H PRN Prudencio Burly III, PA-C       Or  . metoCLOPramide (REGLAN) injection 5-10 mg  5-10 mg Intravenous Q8H PRN Prudencio Burly III, PA-C      . metoprolol tartrate (LOPRESSOR) tablet 100 mg  100 mg Oral BID Prudencio Burly III, PA-C   100 mg at 02/04/18 0839  . morphine 2 MG/ML injection 0.5-1 mg  0.5-1 mg Intravenous Q2H PRN Prudencio Burly III, PA-C      . ondansetron [facility]) tablet 4 mg  4 mg Oral Q6H PRN Prudencio Burly III, PA-C       Or  . ondansetron [facility]) injection 4 mg  4 mg Intravenous Q6H PRN Prudencio Burly III, PA-C      . polyethylene glycol (MIRALAX / GLYCOLAX) packet 17 g  17 g Oral Daily PRN Prudencio Burly III, PA-C      . senna (SENOKOT) tablet 8.6 mg  1 tablet Oral Daily Prudencio Burly III, PA-C   8.6 mg at 02/04/18 0841  . sodium phosphate (FLEET) 7-19 GM/118ML enema 1 enema  1 enema Rectal Once PRN Prudencio Burly III, PA-C      . warfarin (COUMADIN) tablet 3 mg  3 mg Oral ONCE-1800 Priscella Mann, RPH      . Warfarin - Pharmacist Dosing Inpatient   Does not apply q1800 Karren Cobble [facility]         Discharge Medications: Please see discharge summary for a list of discharge medications.  Relevant Imaging Results:  Relevant Lab Results:   Additional Information SSN: [place], [place]

## 2018-02-04 NOTE — Progress Notes (Signed)
PROGRESS NOTE                                                                                                                                                                                                             Patient Demographics:    Rhonda Contreras, is a 67 y.o. female, DOB - 08-Sep-1951, QMV:[id number]  Admit date - 02/01/2018   Admitting Physician Norval Morton, MD  Outpatient Primary MD for the patient is Wenda Low, MD  LOS - 3  Outpatient Specialists: None  Chief Complaint  Patient presents with  . Fall       Brief Narrative   67 year old female with history of hypertension, lupus, type 2 diabetes mellitus, CVA on chronic anticoagulation with Coumadin, osteoporosis presented with mechanical fall at home and sustained a displaced spiral oblique fracture of the left femoral diaphysis.  Patient taken to the OR today and underwent left femur intramedullary nail placement.   Subjective:   Patient seen today.  No new complaints.  Is well controlled.  The patient is also awaiting rehab placement.   Assessment  & Plan :    Principal Problem: Left femur fracture, subsequent encounter ([place]) Secondary to mechanical fall.  Underwent left femur intramedullary nail placement.  Up with therapy.  Resume Coumadin.  DC Foley. PRN hydrocodone for pain.  Robaxin for muscle spasms.  Scheduled bowel regimen. PT evaluation pending.  Patient lives alone and should benefit from SNF. 02/04/2018 Pursue disposition  Active Problems: Acute leukocytosis Suspect reactive.  No signs of infection.  Resolved and subsequent lab.     Diabetes mellitus type 2 in nonobese [facility]) Well-controlled on oral medications.  Elevated CBG on admission likely due to stress.  Holding Amaryl and metformin and monitoring with sliding scale coverage.  History of CVA on chronic anticoagulation Has residual mild right-sided weakness.  INR  therapeutic on admission.  Coumadin held for surgery and will be resumed today.  Essential hypertension Resume home medications.  Blood pressure stable.  Chronic kidney disease stage III Renal function stable.  ?Chronic diastolic CHF Patient is euvolemic.  Monitor for now. 02/04/2018: No recent echocardiogram.  Osteoporosis Alendronate as outpatient  Postoperative anemia Monitor.   Code Status : Full code  Family Communication  : Sister at bedside  Disposition Plan  : SNF possibly tomorrow  Barriers For Discharge :  Postop  Consults  : Dr. Percell Miller  Procedures  : Left femur IM nail  DVT Prophylaxis  : Therapeutic on Coumadin  Lab Results  Component Value Date   PLT 138 (L) 02/03/2018    Antibiotics  :   Anti-infectives (From admission, onward)   Start     Dose/Rate Route Frequency Ordered Stop   02/02/18 1800  ceFAZolin (ANCEF) IVPB 2g/100 mL premix     2 g 200 mL/hr over 30 Minutes Intravenous Every 6 hours 02/02/18 1618 02/03/18 0155   02/02/18 1254  ceFAZolin (ANCEF) 2-4 GM/100ML-% IVPB    Note to Pharmacy:  Starleen Arms   : cabinet override      02/02/18 1254 02/03/18 0059   02/02/18 1254  ceFAZolin (ANCEF) 2-4 GM/100ML-% IVPB  Status:  Discontinued    Note to Pharmacy:  Lisette Grinder   : cabinet override      02/02/18 1254 02/02/18 1328        Objective:   Vitals:   02/03/18 0928 02/03/18 1421 02/03/18 2048 02/04/18 0403  BP:  116/63 117/70 (!) 103/59  Pulse:  79 (!) 102 90  Resp:  18 17 12   Temp:  99.9 F (37.7 C) 98.2 F (36.8 C) 97.6 F (36.4 C)  TempSrc:  Oral Oral Oral  SpO2: 97% 94% 95% 92%  Weight:      Height:        Wt Readings from Last 3 Encounters:  02/01/18 59 kg (130 lb)     Intake/Output Summary (Last 24 hours) at 02/04/2018 1107 Last data filed at 02/03/2018 1500 Gross per 24 hour  Intake 1381.67 ml  Output -  Net 1381.67 ml     Physical Exam General: Elderly female not in distress HEENT: Moist mucosa, supple  neck Chest: Clear bilaterally CVS: Normal S1 and S2, no murmurs GI: Soft, nondistended, nontender Musculoskeletal: Warm, Ace wrap over left femur, Foley+     Data Review:    CBC Recent Labs  Lab 02/01/18 1733 02/02/18 0558 02/03/18 0617  WBC 17.0* 8.9 7.5  HGB 13.0 11.3* 9.3*  HCT 40.0 35.2* 28.7*  PLT 206 190 138*  MCV 93.5 93.6 94.4  MCH 30.4 30.1 30.6  MCHC 32.5 32.1 32.4  RDW 14.0 14.1 14.3  LYMPHSABS 1.5  --   --   MONOABS 0.7  --   --   EOSABS 0.1  --   --   BASOSABS 0.0  --   --     Chemistries  Recent Labs  Lab 02/01/18 1733 02/02/18 1624 02/03/18 0617  NA 140  --  139  K 4.0  --  3.6  CL 106  --  107  CO2 22  --  24  GLUCOSE 234*  --  128*  BUN 24*  --  16  CREATININE 1.21* 0.97 0.93  CALCIUM 9.5  --  8.1*   ------------------------------------------------------------------------------------------------------------------ No results for input(s): CHOL, HDL, LDLCALC, TRIG, CHOLHDL, LDLDIRECT in the last 72 hours.  Lab Results  Component Value Date   HGBA1C (H) 05/15/2008    9.0 (NOTE)   The ADA recommends the following therapeutic goal for glycemic   control related to Hgb A1C measurement:   Goal of Therapy:   < 7.0% Hgb A1C   Reference: American Diabetes Association: Clinical Practice   Recommendations 2008, Diabetes Care,  2008, 31:(Suppl 1).   ------------------------------------------------------------------------------------------------------------------ No results for input(s): TSH, T4TOTAL, T3FREE, THYROIDAB in the last 72 hours.  Invalid input(s): FREET3 ------------------------------------------------------------------------------------------------------------------ No results for  input(s): VITAMINB12, FOLATE, FERRITIN, TIBC, IRON, RETICCTPCT in the last 72 hours.  Coagulation profile Recent Labs  Lab 02/01/18 1733 02/02/18 0558 02/04/18 0544  INR 2.07 1.91 1.68    No results for input(s): DDIMER in the last 72 hours.  Cardiac  Enzymes No results for input(s): CKMB, TROPONINI, MYOGLOBIN in the last 168 hours.  Invalid input(s): CK ------------------------------------------------------------------------------------------------------------------ No results found for: BNP  Inpatient Medications  Scheduled Meds: . amLODipine  10 mg Oral Daily  . atorvastatin  20 mg Oral Daily  . docusate sodium  100 mg Oral BID  . enoxaparin (LOVENOX) injection  30 mg Subcutaneous Q24H  . insulin aspart  0-5 Units Subcutaneous QHS  . insulin aspart  0-9 Units Subcutaneous TID WC  . ketoconazole  1 application Topical Daily  . metoprolol tartrate  100 mg Oral BID  . senna  1 tablet Oral Daily  . warfarin  3 mg Oral ONCE-1800  . Warfarin - Pharmacist Dosing Inpatient   Does not apply q1800   Continuous Infusions: . lactated ringers Stopped (02/03/18 1500)  . methocarbamol (ROBAXIN)  IV     PRN Meds:.acetaminophen, bisacodyl, HYDROcodone-acetaminophen, methocarbamol **OR** methocarbamol (ROBAXIN)  IV, metoCLOPramide **OR** metoCLOPramide (REGLAN) injection, morphine injection, ondansetron **OR** ondansetron (ZOFRAN) IV, polyethylene glycol, sodium phosphate  Micro Results Recent Results (from the past 240 hour(s))  Surgical pcr screen     Status: None   Collection Time: 02/01/18 10:01 PM  Result Value Ref Range Status   MRSA, PCR NEGATIVE NEGATIVE Final   Staphylococcus aureus NEGATIVE NEGATIVE Final    Comment: (NOTE) The Xpert SA Assay (FDA approved for NASAL specimens in patients 53 years of age and older), is one component of a comprehensive surveillance program. It is not intended to diagnose infection nor to guide or monitor treatment. Performed at [facility], [place] [address], [place], [place] [address]     Radiology Reports Dg Chest 1 View  Result Date: 02/01/2018 CLINICAL DATA:  Preop chest for left femoral fracture. EXAM: CHEST  1 VIEW COMPARISON:  None. FINDINGS: Cardiomegaly with ectatic thoracic  aorta. Atelectasis noted at each lung base. No pulmonary edema or focal pulmonary consolidation. Remote left-sided rib fractures. No acute osseous abnormality of the bony thorax. IMPRESSION: Cardiomegaly without active pulmonary disease. Electronically Signed   By: Ashley Royalty M.D.   On: 02/01/2018 18:59   Dg Pelvis 1-2 Views  Result Date: 02/01/2018 CLINICAL DATA:  Left hip pain after slip and fall in bathtub today. EXAM: PELVIS - 1-2 VIEW COMPARISON:  None. FINDINGS: There is no evidence of pelvic fracture or diastasis. Femoral heads are seated within their acetabular components. No pelvic bone lesions are seen. Left L5-S1 assimilation joint. 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 IMPRESSION: 1. No acute fracture of the pelvis. 2. 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 3. L5-S1 left-sided assimilation joint. Electronically Signed   By: Ashley Royalty M.D.   On: 02/01/2018 18:46   Dg Tibia/fibula Left  Result Date: 02/01/2018 CLINICAL DATA:  Left leg pain after slip in the bathroom today. EXAM: LEFT TIBIA AND FIBULA - 2 VIEW COMPARISON:  None. FINDINGS: Partially visualized femoral diaphyseal fracture with lateral displacement of the distal fracture fragment. Tibial arteriosclerosis is noted. Mild joint space narrowing of the femorotibial compartment. No joint dislocation at the knee nor ankle joint. Subtalar joint is maintained as are the midfoot articulations. Soft tissues are unremarkable. IMPRESSION: Partially included femoral diaphyseal fracture with lateral  displacement 1 shaft width of the distal fracture fragment. No tibial nor fibular fracture. Electronically Signed   By: Ashley Royalty M.D.   On: 02/01/2018 18:50   Dg C-arm 1-60 Min  Result Date: 02/02/2018 CLINICAL DATA:  LEFT FEMUR INTRAMEDULLARY (IM) NAIL For Left Femur Fracture. Fluoro Time is 1 min 5 secs. EXAM: LEFT FEMUR 2 VIEWS; DG C-ARM  61-120 MIN COMPARISON:  02/01/2018 FINDINGS: The displaced spiral fracture of mid to distal left femoral shaft has been reduced with an intramedullary rod. The fracture is in near anatomic alignment. The orthopedic hardware appears well seated. IMPRESSION: Well-aligned left femur fracture following ORIF. Electronically Signed   By: Lajean Manes M.D.   On: 02/02/2018 15:34   Dg Femur Min 2 Views Left  Result Date: 02/02/2018 CLINICAL DATA:  LEFT FEMUR INTRAMEDULLARY (IM) NAIL For Left Femur Fracture. Fluoro Time is 1 min 5 secs. EXAM: LEFT FEMUR 2 VIEWS; DG C-ARM 61-120 MIN COMPARISON:  02/01/2018 FINDINGS: The displaced spiral fracture of mid to distal left femoral shaft has been reduced with an intramedullary rod. The fracture is in near anatomic alignment. The orthopedic hardware appears well seated. IMPRESSION: Well-aligned left femur fracture following ORIF. Electronically Signed   By: Lajean Manes M.D.   On: 02/02/2018 15:34   Dg Femur Min 2 Views Left  Result Date: 02/01/2018 CLINICAL DATA:  Left leg pain after fall in bathroom today. EXAM: LEFT FEMUR 2 VIEWS COMPARISON:  None. FINDINGS: There is an acute spiral oblique fracture of the left femoral diaphysis with 1 shaft width lateral and anterior displacement of the distal fracture fragment as well as 90 degrees of external rotation and 4 cm of foreshortening. No intra-articular extension is identified. No joint dislocation at the hip or knee. IMPRESSION: Acute spiral oblique fracture of the left femoral diaphysis with 1 shaft with anterior and lateral displacement of the distal fracture fragment with 90 degrees of external rotation and 4 cm of foreshortening/override. Electronically Signed   By: Ashley Royalty M.D.   On: 02/01/2018 18:53   Dg Femur Port Min 2 Views Left  Result Date: 02/02/2018 CLINICAL DATA:  Left femur fracture ORIF. EXAM: LEFT FEMUR PORTABLE 2 VIEWS COMPARISON:  Left femur x-rays from yesterday. FINDINGS: Near anatomic  alignment of the distal femoral diaphyseal spiral fracture status post intramedullary nail placement. No evidence of hardware complication. No new acute abnormality. IMPRESSION: Near anatomic alignment of the distal femur fracture status post ORIF. Electronically Signed   By: Titus Dubin M.D.   On: 02/02/2018 19:59    Bonnell Public M.D on 02/04/2018 at 11:07 AM  Between 7am to 7pm - Pager - [phone] After 7pm go to [email or web address] - password [facility]  [facility] -  Office  [phone]

## 2018-02-04 NOTE — Progress Notes (Signed)
Physical Therapy Treatment Patient Details Name: Rhonda Contreras MRN: [id number] DOB: [birth year] Today's Date: 02/04/2018    History of Present Illness 67 y.o. female admitted on 02/01/18 s/p fall with L femur fx s/p ORIF with post op TDWB status.  Pt with significant PMH of stroke in 2009 with mild residual R sided weakness, renal disorder, lupus, immune difficiency disorder, HTN, DM, and congenital deformity L thumb.      PT Comments    Pt is very pleasant and motivated to participate with therapy. Today's skilled session focused on ther ex for LE strengthening. Pt required frequent cues throughout exercises to breathe. Pt ambulated 16 ft to and from restroom with RW and maintaining TDWB status. She would benefit from continued skilled PT to increase functional independence and safety with mobility. Will continue to follow acutely.     Follow Up Recommendations  SNF     Equipment Recommendations  Rolling walker with 5" wheels    Recommendations for Other Services       Precautions / Restrictions Precautions Precautions: Fall Precaution Comments: fall outside last year Restrictions Weight Bearing Restrictions: Yes LLE Weight Bearing: Touchdown weight bearing    Mobility  Bed Mobility               General bed mobility comments: in chair on arrival  Transfers Overall transfer level: Needs assistance Equipment used: Rolling walker (2 wheeled) Transfers: Sit to/from Stand Sit to Stand: Min guard         General transfer comment: min guard for safety. Cues for hand placement and WB status  Ambulation/Gait Ambulation/Gait assistance: Min guard Ambulation Distance (Feet): 16 Feet Assistive device: Rolling walker (2 wheeled) Gait Pattern/deviations: Step-to pattern(hop to) Gait velocity: decreased Gait velocity interpretation: <1.31 ft/sec, indicative of household ambulator General Gait Details: Pt able to maintain TDWB. Takes frequent small hops to progress  forward. Cues for RW proximity and sequencing.   Stairs             Wheelchair Mobility    Modified Rankin (Stroke Patients Only)       Balance Overall balance assessment: Needs assistance Sitting-balance support: Feet supported;No upper extremity supported Sitting balance-Leahy Scale: Good     Standing balance support: Bilateral upper extremity supported Standing balance-Leahy Scale: Poor Standing balance comment: needs RW for balance due to TDWB L                             Cognition Arousal/Alertness: Awake/alert Behavior During Therapy: WFL for tasks assessed/performed Overall Cognitive Status: Within Functional Limits for tasks assessed                                 General Comments: Very plesent and willing to participate      Exercises Total Joint Exercises Quad Sets: AROM;Left;10 reps;Seated Short Arc Quad: AROM;Left;10 reps;Seated Heel Slides: AROM;Left;10 reps;Seated Hip ABduction/ADduction: AROM;Left;10 reps;Seated Long Arc Quad: AROM;Left;10 reps;Seated    General Comments General comments (skin integrity, edema, etc.): pt ambulated to restroom. She required min A for peri care. Cues throughout ther ex to breathe.      Pertinent Vitals/Pain Pain Assessment: Faces Faces Pain Scale: Hurts little more Pain Location: L LE with knee flexion Pain Descriptors / Indicators: Aching;Burning Pain Intervention(s): Monitored during session;Limited activity within patient's tolerance    Home Living  Prior Function            PT Goals (current goals can now be found in the care plan section) Acute Rehab PT Goals Patient Stated Goal: to get strong enough to get hom to her dog, [name] PT Goal Formulation: With patient Time For Goal Achievement: 02/10/18 Potential to Achieve Goals: Good Progress towards PT goals: Progressing toward goals    Frequency    Min 3X/week      PT Plan       Co-evaluation              AM-PAC PT "6 Clicks" Daily Activity  Outcome Measure  Difficulty turning over in bed (including adjusting bedclothes, sheets and blankets)?: Unable Difficulty moving from lying on back to sitting on the side of the bed? : Unable Difficulty sitting down on and standing up from a chair with arms (e.g., wheelchair, bedside commode, etc,.)?: Unable Help needed moving to and from a bed to chair (including a wheelchair)?: A Little Help needed walking in hospital room?: A Little Help needed climbing 3-5 steps with a railing? : Total 6 Click Score: 10    End of Session Equipment Utilized During Treatment: Gait belt Activity Tolerance: Patient tolerated treatment well Patient left: in chair;with call bell/phone within reach Nurse Communication: Mobility status(to RN tech) PT Visit Diagnosis: Muscle weakness (generalized) (M62.81);Difficulty in walking, not elsewhere classified (R26.2);Pain Pain - Right/Left: Left Pain - part of body: Leg     Time: 1540-1606 PT Time Calculation (min) (ACUTE ONLY): 26 min  Charges:  $Gait Training: 8-22 mins $Therapeutic Exercise: 8-22 mins                    G Codes:       Benjiman Core, [place] Pager [phone] Acute Rehab   Allena Katz 02/04/2018, 4:13 PM

## 2018-02-05 LAB — GLUCOSE, CAPILLARY
GLUCOSE-CAPILLARY: 106 mg/dL — AB (ref 65–99)
Glucose-Capillary: 132 mg/dL — ABNORMAL HIGH (ref 65–99)
Glucose-Capillary: 178 mg/dL — ABNORMAL HIGH (ref 65–99)
Glucose-Capillary: 183 mg/dL — ABNORMAL HIGH (ref 65–99)
Glucose-Capillary: 197 mg/dL — ABNORMAL HIGH (ref 65–99)

## 2018-02-05 LAB — PROTIME-INR
INR: 1.99
Prothrombin Time: 22.4 seconds — ABNORMAL HIGH (ref 11.4–15.2)

## 2018-02-05 MED ORDER — WARFARIN SODIUM 2.5 MG PO TABS
2.5000 mg | ORAL_TABLET | Freq: Once | ORAL | Status: AC
  Administered 2018-02-05: 2.5 mg via ORAL
  Filled 2018-02-05: qty 1

## 2018-02-05 MED ORDER — POLYETHYLENE GLYCOL 3350 17 G PO PACK
17.0000 g | PACK | Freq: Every day | ORAL | Status: DC
  Administered 2018-02-05 – 2018-02-06 (×2): 17 g via ORAL
  Filled 2018-02-05 (×5): qty 1

## 2018-02-05 NOTE — Progress Notes (Signed)
ANTICOAGULATION CONSULT NOTE - Follow Up Consult  Pharmacy Consult for Coumadin Indication: History of CVA  No Known Allergies  Patient Measurements: Height: 4\' 6"  (137.2 cm) Weight: 130 lb (59 kg) IBW/kg (Calculated) : 31.7  Vital Signs: Temp: 98.8 F (37.1 C) (04/14 0630) Temp Source: Oral (04/14 0630) BP: 117/68 (04/14 0630) Pulse Rate: 86 (04/14 0630)  Labs: Recent Labs    02/02/18 1624 02/03/18 0617 02/04/18 0544 02/05/18 0706  HGB  --  9.3*  --   --   HCT  --  28.7*  --   --   PLT  --  138*  --   --   LABPROT  --   --  19.6* 22.4*  INR  --   --  1.68 1.99  CREATININE 0.97 0.93  --   --     Estimated Creatinine Clearance: 38.9 mL/min (by C-G formula based on SCr of 0.93 mg/dL).  Assessment: 67 year old female on Coumadin prior to admission for history of a CVA (2009) who was admitted 4/10 s/p fall and femur fracture s/p IM nailing on 4/11. Coumadin was held 4/10 and 4/11. Last dose prior to admission was on 4/9. INR on admission was therapeutic at 2.07 on home dose of 5mg  on Sundays and Tuesdays and 2.5mg  all other days.   INR today up to 1.99 after 6mg  dose on 4/12 and a 3mg  dose on 4/13 (slightly above home dose). Increase was ~0.3 points in last 24hrs.  Goal of Therapy:  INR 2-3 Monitor platelets by anticoagulation protocol: Yes   Plan:  Continue Lovenox 30 mg SQ daily Coumadin 2.5mg  po x1 tonight - likely back to home dose tomorrow. Daily PT/INR  Sloan Leiter, PharmD, BCPS, BCCCP Clinical Pharmacist Clinical phone 02/05/2018 until 3:30PM- [phone] After hours, please call [phone] 02/05/2018,8:48 AM

## 2018-02-05 NOTE — Progress Notes (Addendum)
Patient ID: Rhonda Contreras, female   DOB: Nov 12, 1950, 67 y.o.   MRN: [id number]     Subjective:  Patient reports pain as mild.  Patient in the chair and in no acute distress.  Objective:   VITALS:   Vitals:   02/04/18 1217 02/04/18 2053 02/05/18 0629 02/05/18 0630  BP: 120/60 101/65 (!) 145/71 117/68  Pulse: 79 81 84 86  Resp: 18 18 16 16   Temp: 98.2 F (36.8 C) 98.4 F (36.9 C) 98.2 F (36.8 C) 98.8 F (37.1 C)  TempSrc: Oral Oral Oral Oral  SpO2: 95% 94% 92% 96%  Weight:      Height:        ABD soft Sensation intact distally Dorsiflexion/Plantar flexion intact Incision: dressing C/D/I and no drainage   Lab Results  Component Value Date   WBC 7.5 02/03/2018   HGB 9.3 (L) 02/03/2018   HCT 28.7 (L) 02/03/2018   MCV 94.4 02/03/2018   PLT 138 (L) 02/03/2018   BMET    Component Value Date/Time   NA 139 02/03/2018 0617   K 3.6 02/03/2018 0617   CL 107 02/03/2018 0617   CO2 24 02/03/2018 0617   GLUCOSE 128 (H) 02/03/2018 0617   BUN 16 02/03/2018 0617   CREATININE 0.93 02/03/2018 0617   CALCIUM 8.1 (L) 02/03/2018 0617   GFRNONAA >60 02/03/2018 0617   GFRAA >60 02/03/2018 0617     Assessment/Plan: 3 Days Post-Op   Principal Problem:   Other fracture of left femur, initial encounter for closed fracture ([place]) Active Problems:   Diabetes mellitus type 2 in nonobese ([facility])   Chronic anticoagulation   Essential hypertension   History of CVA (cerebrovascular accident)   Advance diet Up with therapy Discharge to SNF TTWB left lower ext Dry dressing PRN     DOUGLAS PARRY, BRANDON 02/05/2018, 12:20 PM  Discussed and agree with above.  Marchia Bond, MD Cell [phone]

## 2018-02-05 NOTE — Clinical Social Work Note (Signed)
Pt has chosen [facility]. [facility] notified and will start Holli Humbles on 4/15. Pt and pt's POA is aware of this process.   [place], [place]

## 2018-02-05 NOTE — Progress Notes (Addendum)
PROGRESS NOTE  Rhonda Contreras VZC:[id number] DOB: 05/23/1951 DOA: 02/01/2018 PCP: Wenda Low, MD  HPI/Recap of past 24 hours: Rhonda Contreras is a 67 y.o. female with medical history significant of HTN, lupus, DM type II, CVA on chronic anticoagulation, and osteoporosis; who presents after having a fall at home with left leg pain.  Patient provides her own history and at baseline patient lives alone. She was in the bathroom getting ready to go out when her slipper slipped from underneath her and she fell landing onto her left leg.  Denies any trauma to her head or loss of consciousness.  She was able to crawl to her cell phone to call for EMS.  Reports having severe pain in the left leg unable to bear weight.  Was unable to try anything to relieve pain symptoms.  Otherwise, patient notes being in her normal state of health and had recently been started on Fosamax for osteoporosis within the last month.  Her blood sugars are normally low and her last hemoglobin A1c was less than 7.  POD #3 post left femur intramedullary nail.  Pain is well controlled on current pain management.  Admitted to constipation on opiates.  Start bowel regimen with MiraLAX daily.  Assessment/Plan: Principal Problem:   Other fracture of left femur, initial encounter for closed fracture [facility]) Active Problems:   Diabetes mellitus type 2 in nonobese (HCC)   Chronic anticoagulation   Essential hypertension   History of CVA (cerebrovascular accident)  Left femur fracture secondary to mechanical fall POD #3 post left femur intramedullary nail Continue pain management with as needed hydrocodone and Robaxin for muscle spasm Started on MiraLAX daily Continue Dulcolax as needed Will be discharged to SNF pending insurance approval  Type 2 diabetes Uncontrolled on oral medications Continue insulin sliding scale in the hospital  History of CVA on chronic anticoagulation On Coumadin Managed by pharmacy Last INR1.99 on  02/05/2018  Acute blood loss anemia Hemoglobin on presentation 13.0 Hemoglobin 9.3 today Continue to monitor CBC    Code Status: Full code  Family Communication: Sister bedside  Disposition Plan: SNF when approval of insurance is completed   Consultants: Orthopedic surgery   Procedures: left femur intramedullary nail  Antimicrobials:  None  DVT prophylaxis: SCDs, coumadin   Objective: Vitals:   02/04/18 1217 02/04/18 2053 02/05/18 0629 02/05/18 0630  BP: 120/60 101/65 (!) 145/71 117/68  Pulse: 79 81 84 86  Resp: 18 18 16 16   Temp: 98.2 F (36.8 C) 98.4 F (36.9 C) 98.2 F (36.8 C) 98.8 F (37.1 C)  TempSrc: Oral Oral Oral Oral  SpO2: 95% 94% 92% 96%  Weight:      Height:       No intake or output data in the 24 hours ending 02/05/18 1303 Filed Weights   02/01/18 1715  Weight: 59 kg (130 lb)    Exam:   General: 67 year old Caucasian female well-developed well-nourished no acute distress.  Alert and oriented x3  Cardiovascular:    Respiratory: Clear to auscultation with no wheezes or rales.  Regular rate and rhythm with no rubs or gallops.  No JVD or thyromegaly present.   Abdomen: Soft nontender nondistended normal bowel sounds x4  Musculoskeletal: Mild edema in the left lower extremity.  Site of surgery is covered with surgical wrap  Skin: As stated above.  No obvious rashes or ulcerative lesions present.  Psychiatry: Mood is appropriate for condition and setting   Data Reviewed: CBC: Recent Labs  Lab  02/01/18 1733 02/02/18 0558 02/03/18 0617  WBC 17.0* 8.9 7.5  NEUTROABS 14.7*  --   --   HGB 13.0 11.3* 9.3*  HCT 40.0 35.2* 28.7*  MCV 93.5 93.6 94.4  PLT 206 190 527*   Basic Metabolic Panel: Recent Labs  Lab 02/01/18 1733 02/02/18 1624 02/03/18 0617  NA 140  --  139  K 4.0  --  3.6  CL 106  --  107  CO2 22  --  24  GLUCOSE 234*  --  128*  BUN 24*  --  16  CREATININE 1.21* 0.97 0.93  CALCIUM 9.5  --  8.1*    GFR: Estimated Creatinine Clearance: 38.9 mL/min (by C-G formula based on SCr of 0.93 mg/dL). Liver Function Tests: No results for input(s): AST, ALT, ALKPHOS, BILITOT, PROT, ALBUMIN in the last 168 hours. No results for input(s): LIPASE, AMYLASE in the last 168 hours. No results for input(s): AMMONIA in the last 168 hours. Coagulation Profile: Recent Labs  Lab 02/01/18 1733 02/02/18 0558 02/04/18 0544 02/05/18 0706  INR 2.07 1.91 1.68 1.99   Cardiac Enzymes: No results for input(s): CKTOTAL, CKMB, CKMBINDEX, TROPONINI in the last 168 hours. BNP (last 3 results) No results for input(s): PROBNP in the last 8760 hours. HbA1C: No results for input(s): HGBA1C in the last 72 hours. CBG: Recent Labs  Lab 02/04/18 1127 02/04/18 1637 02/04/18 2052 02/05/18 0628 02/05/18 1147  GLUCAP 170* 186* 132* 106* 178*   Lipid Profile: No results for input(s): CHOL, HDL, LDLCALC, TRIG, CHOLHDL, LDLDIRECT in the last 72 hours. Thyroid Function Tests: No results for input(s): TSH, T4TOTAL, FREET4, T3FREE, THYROIDAB in the last 72 hours. Anemia Panel: No results for input(s): VITAMINB12, FOLATE, FERRITIN, TIBC, IRON, RETICCTPCT in the last 72 hours. Urine analysis:    Component Value Date/Time   COLORURINE YELLOW 05/16/2008 0330   APPEARANCEUR CLEAR 05/16/2008 0330   LABSPEC 1.013 05/16/2008 0330   PHURINE 6.0 05/16/2008 0330   GLUCOSEU 500 (A) 05/16/2008 0330   HGBUR NEGATIVE 05/16/2008 0330   BILIRUBINUR NEGATIVE 05/16/2008 0330   KETONESUR 15 (A) 05/16/2008 0330   PROTEINUR NEGATIVE 05/16/2008 0330   UROBILINOGEN 1.0 05/16/2008 0330   NITRITE NEGATIVE 05/16/2008 0330   LEUKOCYTESUR NEGATIVE 05/16/2008 0330   Sepsis Labs: @LABRCNTIP (procalcitonin:4,lacticidven:4)  ) Recent Results (from the past 240 hour(s))  Surgical pcr screen     Status: None   Collection Time: 02/01/18 10:01 PM  Result Value Ref Range Status   MRSA, PCR NEGATIVE NEGATIVE Final   Staphylococcus aureus  NEGATIVE NEGATIVE Final    Comment: (NOTE) The Xpert SA Assay (FDA approved for NASAL specimens in patients 70 years of age and older), is one component of a comprehensive surveillance program. It is not intended to diagnose infection nor to guide or monitor treatment. Performed at [facility], [place] [address] [place], [place] [address]       Studies: No results found.  Scheduled Meds: . amLODipine  10 mg Oral Daily  . atorvastatin  20 mg Oral Daily  . docusate sodium  100 mg Oral BID  . enoxaparin (LOVENOX) injection  30 mg Subcutaneous Q24H  . insulin aspart  0-5 Units Subcutaneous QHS  . insulin aspart  0-9 Units Subcutaneous TID WC  . ketoconazole  1 application Topical Daily  . metoprolol tartrate  100 mg Oral BID  . senna  1 tablet Oral Daily  . warfarin  2.5 mg Oral ONCE-1800  . Warfarin - Pharmacist Dosing Inpatient  Does not apply q1800    Continuous Infusions: . lactated ringers Stopped (02/03/18 1500)  . methocarbamol (ROBAXIN)  IV       LOS: 4 days     Kayleen Memos, MD [facility] Pager [phone]  If 7PM-7AM, please contact night-coverage [email or web address] Password [email or web address] 02/05/2018, 1:03 PM

## 2018-02-06 ENCOUNTER — Encounter (HOSPITAL_COMMUNITY): Payer: Self-pay | Admitting: Orthopedic Surgery

## 2018-02-06 LAB — CBC
HEMATOCRIT: 32 % — AB (ref 36.0–46.0)
HEMOGLOBIN: 10.4 g/dL — AB (ref 12.0–15.0)
MCH: 30.8 pg (ref 26.0–34.0)
MCHC: 32.5 g/dL (ref 30.0–36.0)
MCV: 94.7 fL (ref 78.0–100.0)
Platelets: 232 10*3/uL (ref 150–400)
RBC: 3.38 MIL/uL — ABNORMAL LOW (ref 3.87–5.11)
RDW: 14.6 % (ref 11.5–15.5)
WBC: 8.8 10*3/uL (ref 4.0–10.5)

## 2018-02-06 LAB — GLUCOSE, CAPILLARY
GLUCOSE-CAPILLARY: 142 mg/dL — AB (ref 65–99)
GLUCOSE-CAPILLARY: 155 mg/dL — AB (ref 65–99)
Glucose-Capillary: 218 mg/dL — ABNORMAL HIGH (ref 65–99)
Glucose-Capillary: 149 mg/dL — ABNORMAL HIGH (ref 65–99)

## 2018-02-06 LAB — PROTIME-INR
INR: 2.12
PROTHROMBIN TIME: 23.6 s — AB (ref 11.4–15.2)

## 2018-02-06 MED ORDER — POLYETHYLENE GLYCOL 3350 17 G PO PACK: 17.0000 g | PACK | Freq: Every day | ORAL | 0 refills | Status: DC

## 2018-02-06 MED ORDER — WARFARIN SODIUM 2.5 MG PO TABS
2.5000 mg | ORAL_TABLET | Freq: Once | ORAL | Status: AC
  Administered 2018-02-06: 2.5 mg via ORAL
  Filled 2018-02-06: qty 1

## 2018-02-06 NOTE — Progress Notes (Signed)
ANTICOAGULATION CONSULT NOTE - Follow Up Consult  Pharmacy Consult:  Coumadin Indication: History of CVA  No Known Allergies  Patient Measurements: Height: 4\' 6"  (137.2 cm) Weight: 130 lb (59 kg) IBW/kg (Calculated) : 31.7  Vital Signs: Temp: 98.2 F (36.8 C) (04/15 0644) Temp Source: Oral (04/15 0644) BP: 147/76 (04/15 0644) Pulse Rate: 88 (04/15 0644)  Labs: Recent Labs    02/04/18 0544 02/05/18 0706 02/06/18 0659  HGB  --   --  10.4*  HCT  --   --  32.0*  PLT  --   --  232  LABPROT 19.6* 22.4* 23.6*  INR 1.68 1.99 2.12    Estimated Creatinine Clearance: 38.9 mL/min (by C-G formula based on SCr of 0.93 mg/dL).  Assessment: 70 YOF on Coumadin PTA for history of a CVA (2009).  Patient was admitted 02/01/18 with fall and femur fracture s/p IM nailing on 02/02/18. Coumadin was held 4/10 and 4/11 and resumed on 02/03/18.  She is also on Lovenox bridge.  INR therapeutic; no bleeding reported.  Home Coumadin dose:  2.5mg  PO daily except 5mg  on Tues / Sun   Goal of Therapy:  INR 2-3 Monitor platelets by anticoagulation protocol: Yes    Plan:  Coumadin 2.5mg  PO today D/C Lovenox as INR is therapeutic Daily PT / INR   Baxter Gonzalez D. Mina Marble, PharmD, BCPS Pager:  [phone] 02/06/2018, 9:26 AM

## 2018-02-06 NOTE — Plan of Care (Signed)

## 2018-02-06 NOTE — Progress Notes (Signed)
Physical Therapy Treatment Patient Details Name: Rhonda Contreras MRN: [id number] DOB: Sep 10, 1951 Today's Date: 02/06/2018    History of Present Illness 67 y.o. female admitted on 02/01/18 s/p fall with L femur fx s/p ORIF with post op TDWB status.  Pt with significant PMH of stroke in 2009 with mild residual R sided weakness, renal disorder, lupus, immune difficiency disorder, HTN, DM, and congenital deformity L thumb.      PT Comments    Patient is progressing very well towards their physical therapy goals. Demonstrating increase in ambulation distance to 25 feet and is maintaining weightbearing precautions. Does display 2/4 DOE and fatigues quickly. Session also focused on bed level general LLE strengthening. Continue to recommend SNF to maximize patient's functional mobility and independence.     Follow Up Recommendations  SNF     Equipment Recommendations  Rolling walker with 5" wheels    Recommendations for Other Services       Precautions / Restrictions Precautions Precautions: Fall Precaution Comments: fall outside last year Restrictions Weight Bearing Restrictions: Yes LLE Weight Bearing: Touchdown weight bearing    Mobility  Bed Mobility Overal bed mobility: Modified Independent             General bed mobility comments: Increased time for supine to sit  Transfers Overall transfer level: Needs assistance Equipment used: Rolling walker (2 wheeled) Transfers: Sit to/from Stand Sit to Stand: Supervision         General transfer comment: min guard for safety. Cues to kick LLE out prior to sitting  Ambulation/Gait Ambulation/Gait assistance: Min guard Ambulation Distance (Feet): 25 Feet Assistive device: Rolling walker (2 wheeled) Gait Pattern/deviations: Step-to pattern(hop to) Gait velocity: decreased   General Gait Details: Pt able to maintain TDWB. Takes frequent small hops. VC's for looking up to avoid bumping into objects.   Stairs              Wheelchair Mobility    Modified Rankin (Stroke Patients Only)       Balance Overall balance assessment: Needs assistance Sitting-balance support: Feet supported;No upper extremity supported Sitting balance-Leahy Scale: Good     Standing balance support: Bilateral upper extremity supported Standing balance-Leahy Scale: Poor Standing balance comment: needs RW for balance due to TDWB L                             Cognition Arousal/Alertness: Awake/alert Behavior During Therapy: WFL for tasks assessed/performed Overall Cognitive Status: Within Functional Limits for tasks assessed                                 General Comments: Very pleasant and engaged in therapy      Exercises Total Joint Exercises Quad Sets: AROM;10 reps;Both;Supine Short Arc Quad: AROM;Left;10 reps;Supine Hip ABduction/ADduction: AROM;Left;10 reps;Supine Long Arc Quad: AROM;Left;10 reps;Supine Other Exercises Other Exercises: Supine single leg bridge (right) x 10     General Comments        Pertinent Vitals/Pain Pain Assessment: Faces Pain Score: 0-No pain    Home Living                      Prior Function            PT Goals (current goals can now be found in the care plan section) Acute Rehab PT Goals Patient Stated Goal: to get strong enough to get  hom to her dog, [name] PT Goal Formulation: With patient Time For Goal Achievement: 02/10/18 Potential to Achieve Goals: Good Progress towards PT goals: Progressing toward goals    Frequency    Min 3X/week      PT Plan Current plan remains appropriate    Co-evaluation              AM-PAC PT "6 Clicks" Daily Activity  Outcome Measure  Difficulty turning over in bed (including adjusting bedclothes, sheets and blankets)?: Unable Difficulty moving from lying on back to sitting on the side of the bed? : Unable Difficulty sitting down on and standing up from a chair with arms (e.g.,  wheelchair, bedside commode, etc,.)?: Unable Help needed moving to and from a bed to chair (including a wheelchair)?: A Little Help needed walking in hospital room?: A Little Help needed climbing 3-5 steps with a railing? : Total 6 Click Score: 10    End of Session Equipment Utilized During Treatment: Gait belt Activity Tolerance: Patient tolerated treatment well Patient left: with call bell/phone within reach;in bed   PT Visit Diagnosis: Muscle weakness (generalized) (M62.81);Difficulty in walking, not elsewhere classified (R26.2);Pain Pain - Right/Left: Left Pain - part of body: Leg     Time: 1525-1540 PT Time Calculation (min) (ACUTE ONLY): 15 min  Charges:  $Therapeutic Exercise: 8-22 mins                    G Codes:       Ellamae Sia, PT, DPT Acute Rehabilitation Services  Pager: [phone]    Willy Eddy 02/06/2018, 3:48 PM

## 2018-02-06 NOTE — Social Work (Signed)
CSW was advised by SNF that they have not received Insurance Auth from [employer].  CSW will continue to follow as patient cannot dc until SNF receives [employer].  CSW sent additional clinicals this morning.  Elissa Hefty, LCSW Clinical Social Worker [phone]

## 2018-02-06 NOTE — Progress Notes (Signed)
Occupational Therapy Treatment Patient Details Name: Rhonda Contreras MRN: [id number] DOB: [birth year] Today's Date: 02/06/2018    History of present illness 67 y.o. female admitted on 02/01/18 s/p fall with L femur fx s/p ORIF with post op TDWB status.  Pt with significant PMH of stroke in 2009 with mild residual R sided weakness, renal disorder, lupus, immune difficiency disorder, HTN, DM, and congenital deformity L thumb.     OT comments  Pt continues to make progress towards OT goals. Skilled OT intervention with focus on functional mobility, toileting, grooming, and standing tolerance with functional tasks. Pt maintained TDWB with mobility without cues to do so. Pt did fatigue quickly but showed and increased in overall safety awareness with functional tasks. Pt will continue to benefit from OT intervention.  Follow Up Recommendations  SNF    Equipment Recommendations  Other (comment)(defer to next venue of care)    Recommendations for Other Services      Precautions / Restrictions Precautions Precautions: Fall Precaution Comments: fall outside last year Restrictions Weight Bearing Restrictions: Yes LLE Weight Bearing: Touchdown weight bearing       Mobility Bed Mobility Overal bed mobility: Modified Independent Bed Mobility: Sit to Supine       Sit to supine: Min assist   General bed mobility comments: min A for L LE sit >supine  Transfers Overall transfer level: Needs assistance Equipment used: Rolling walker (2 wheeled) Transfers: Sit to/from Stand Sit to Stand: Min guard         General transfer comment: min guard for safety. Cues to kick LLE out prior to sitting    Balance Overall balance assessment: Needs assistance Sitting-balance support: Feet supported;No upper extremity supported Sitting balance-Leahy Scale: Good     Standing balance support: Bilateral upper extremity supported Standing balance-Leahy Scale: Poor Standing balance comment: needs RW  for balance due to TDWB L             ADL either performed or assessed with clinical judgement   ADL Overall ADL's : Needs assistance/impaired         Toilet Transfer: RW;Minimal assistance   Toileting- Clothing Manipulation and Hygiene: Minimal assistance;Sit to/from stand       Functional mobility during ADLs: Rolling walker;Minimal assistance General ADL Comments: Pt ambulating to bathroom with RW and steady assistance for toileting. Pt needing assistance with hygiene but able to manage clothing with steady assistance for balance. Pt standing at sink to wash hands and brush teeth with overall close supervision.      Vision Baseline Vision/History: Wears glasses Wears Glasses: Reading only            Cognition Arousal/Alertness: Awake/alert Behavior During Therapy: WFL for tasks assessed/performed Overall Cognitive Status: Within Functional Limits for tasks assessed          General Comments: Very pleasant and engaged in therapy        Exercises Exercises: Total Joint;Other exercises Total Joint Exercises Quad Sets: AROM;10 reps;Both;Supine Short Arc Quad: AROM;Left;10 reps;Supine Hip ABduction/ADduction: AROM;Left;10 reps;Supine Long Arc Quad: AROM;Left;10 reps;Supine Other Exercises Other Exercises: Supine single leg bridge (right) x 10    Shoulder Instructions       General Comments      Pertinent Vitals/ Pain       Pain Assessment: Faces Pain Score: 0-No pain Faces Pain Scale: Hurts a little bit Pain Location: L LE Pain Descriptors / Indicators: Aching;Discomfort Pain Intervention(s): Limited activity within patient's tolerance;Monitored during session;Repositioned  Frequency  Min 2X/week        Progress Toward Goals  OT Goals(current goals can now be found in the care plan section)     Acute Rehab OT Goals Patient Stated Goal: to get strong enough to get home to her dog, [name] OT Goal Formulation: With patient Time For  Goal Achievement: 02/20/18 Potential to Achieve Goals: Good  Plan Discharge plan remains appropriate       AM-PAC PT "6 Clicks" Daily Activity     Outcome Measure   Help from another person eating meals?: None Help from another person taking care of personal grooming?: A Little Help from another person toileting, which includes using toliet, bedpan, or urinal?: A Little Help from another person bathing (including washing, rinsing, drying)?: A Little Help from another person to put on and taking off regular upper body clothing?: A Little Help from another person to put on and taking off regular lower body clothing?: A Lot 6 Click Score: 18    End of Session Equipment Utilized During Treatment: Rolling walker  OT Visit Diagnosis: Unsteadiness on feet (R26.81);Muscle weakness (generalized) (M62.81)   Activity Tolerance Patient tolerated treatment well   Patient Left with call bell/phone within reach;in bed;with family/visitor present   Nurse Communication          Time: 4825-0037 OT Time Calculation (min): 14 min  Charges: OT General Charges $OT Visit: 1 Visit OT Treatments $Self Care/Home Management : 8-22 mins     Gypsy Decant 02/06/2018, 3:56 PM

## 2018-02-06 NOTE — Discharge Summary (Addendum)
Discharge Summary  MERRILEE ANCONA UKG:[id number] DOB: 12/08/1950  PCP: Wenda Low, MD  Admit date: 02/01/2018 Discharge date: 02/06/2018  Time spent: 25 minutes  UPDATE: Discharge delayed due to awaiting insurance approval for placement to SNF for short-term rehab.  Recommendations for Outpatient Follow-up:  1. Follow-up with PCP 2. Continue PT at SNF 3. Fall precautions 4. Take your medications as prescribed  Discharge Diagnoses:  Active Hospital Problems   Diagnosis Date Noted  . Other fracture of left femur, initial encounter for closed fracture ([place]) 02/01/2018  . Diabetes mellitus type 2 in nonobese ([place]) 02/02/2018  . Chronic anticoagulation 02/02/2018  . Essential hypertension 02/02/2018  . History of CVA (cerebrovascular accident) 02/02/2018    Resolved Hospital Problems  No resolved problems to display.    Discharge Condition: Stable  Diet recommendation: Resume previous diet  Vitals:   02/06/18 0644 02/06/18 1139  BP: (!) 147/76 130/62  Pulse: 88 77  Resp: 16 16  Temp: 98.2 F (36.8 C)   SpO2: 93% 95%    History of present illness:  Rhonda Girvan Rossis a 67 y.o.femalewith medical history significant ofHTN, lupus, DM type II, CVAon chronic anticoagulation, and osteoporosis;who presents after having a fall at home with left leg pain.Patient provides her own history and at baseline patient lives alone. Shewas in the bathroom getting ready to go out when her slipper slipped from underneath her and she fell landing onto her left leg. Denies any trauma to her head or loss of consciousness. She was able to crawl to her cell phone to call for EMS. Reports having severe pain in the left leg unable to bear weight.Was unable to try anythin  G to relieve pain symptoms. Otherwise,patient notes being in her normal state of health and had recently been started on Fosamax for osteoporosis within the last month. Her blood sugars are normally low and her last  hemoglobin A1c was less than 7.  POD #3 post left femur intramedullary nail.  Pain is well controlled on current pain management.  Admitted to constipation on opiates.  Start bowel regimen with MiraLAX daily.  02/06/18: Patient seen and examined at her bedside.  She denies any pain, dyspnea or palpitation.  Patient is POD #4 post left femur intramedullary nail.  On the day of discharge patient was hemodynamically stable.  Patient will need to continue PT at SNF.  Hospital Course:  Principal Problem:   Other fracture of left femur, initial encounter for closed fracture [facility]) Active Problems:   Diabetes mellitus type 2 in nonobese [facility])   Chronic anticoagulation   Essential hypertension   History of CVA (cerebrovascular accident)  Left femur fracture secondary to mechanical fall POD #4 post left femur intramedullary nail Continue pain management with as needed hydrocodone and Robaxin for muscle spasm Continue MiraLAX daily Continue Dulcolax as needed Continue PT at SNF  Type 2 diabetes Resume home medications Obtain A1c Last A1c was 9.0 on 05/15/2008  History of CVA on chronic anticoagulation On Coumadin Managed by pharmacy Last INR 2.12 on 02/06/2018  Acute blood loss anemia Hemoglobin on presentation 13.0 Hemoglobin 10.4 from 9.3 yesterday No sign of overt bleeding  HTN BP is well controlled C/w metoprolol and amlodipine   Procedures:  POD #4 post left femur intramedullary nail  Consultations:  Orthopedic surgery  Discharge Exam: BP 130/62 (BP Location: Left Arm)   Pulse 77   Temp 98.2 F (36.8 C) (Oral)   Resp 16   Ht 4\' 6"  (1.372 m)  Wt 59 kg (130 lb)   SpO2 95%   BMI 31.34 kg/m   General: 67 year old Caucasian female well-developed well-nourished no acute distress.  Alert and oriented x3. Cardiovascular: Regular rate and rhythm with no rubs or gallops.  No JVD or thyromegaly present. Respiratory: Clear to auscultation with no wheezes or  rales.  Discharge Instructions You were cared for by a hospitalist during your hospital stay. If you have any questions about your discharge medications or the care you received while you were in the hospital after you are discharged, you can call the unit and asked to speak with the hospitalist on call if the hospitalist that took care of you is not available. Once you are discharged, your primary care physician will handle any further medical issues. Please note that NO REFILLS for any discharge medications will be authorized once you are discharged, as it is imperative that you return to your primary care physician (or establish a relationship with a primary care physician if you do not have one) for your aftercare needs so that they can reassess your need for medications and monitor your lab values.   Allergies as of 02/06/2018   No Known Allergies     Medication List    STOP taking these medications   lisinopril-hydrochlorothiazide 10-12.5 MG tablet Commonly known as:  PRINZIDE,ZESTORETIC     TAKE these medications   alendronate 70 MG tablet Commonly known as:  FOSAMAX Take 70 mg by mouth daily.   amLODipine 10 MG tablet Commonly known as:  NORVASC Take 10 mg by mouth daily.   atorvastatin 20 MG tablet Commonly known as:  LIPITOR Take 20 mg by mouth daily.   docusate sodium 100 MG capsule Commonly known as:  COLACE Take 1 capsule (100 mg total) by mouth 2 (two) times daily. To prevent constipation while taking pain medication.   glimepiride 1 MG tablet Commonly known as:  AMARYL Take 1 mg by mouth daily.   HYDROcodone-acetaminophen 5-325 MG tablet Commonly known as:  NORCO Take 1-2 tablets by mouth every 6 (six) hours as needed for moderate pain.   ketoconazole 2 % cream Commonly known as:  NIZORAL Apply 1 application topically daily. On her face   metFORMIN 1000 MG tablet Commonly known as:  GLUCOPHAGE Take 1,000 mg by mouth at bedtime.   metoprolol tartrate 100  MG tablet Commonly known as:  LOPRESSOR Take 100 mg by mouth 2 (two) times daily.   polyethylene glycol packet Commonly known as:  MIRALAX / GLYCOLAX Take 17 g by mouth daily. Start taking on:  02/07/2018   warfarin 5 MG tablet Commonly known as:  COUMADIN Take 5 mg by mouth daily at 6 PM. Sunday and Tuesday 5 mg  2.5 mg all the other day      No Known Allergies  Contact information for follow-up providers    Renette Butters, MD Follow up in 2 week(s).   Specialty:  Orthopedic Surgery Contact information: [address] [place] [address] [phone]        Wenda Low, MD Follow up in 1 week(s).   Specialty:  Internal Medicine Contact information: [address] [place] [address] [phone]            Contact information for after-discharge care    Destination    [facility] .   Service:  Skilled Chiropodist information: [address] [place] [phone]  The results of significant diagnostics from this hospitalization (including imaging, microbiology, ancillary and laboratory) are listed below for reference.    Significant Diagnostic Studies: Dg Chest 1 View  Result Date: 02/01/2018 CLINICAL DATA:  Preop chest for left femoral fracture. EXAM: CHEST  1 VIEW COMPARISON:  None. FINDINGS: Cardiomegaly with ectatic thoracic aorta. Atelectasis noted at each lung base. No pulmonary edema or focal pulmonary consolidation. Remote left-sided rib fractures. No acute osseous abnormality of the bony thorax. IMPRESSION: Cardiomegaly without active pulmonary disease. Electronically Signed   By: Ashley Royalty M.D.   On: 02/01/2018 18:59   Dg Pelvis 1-2 Views  Result Date: 02/01/2018 CLINICAL DATA:  Left hip pain after slip and fall in bathtub today. EXAM: PELVIS - 1-2 VIEW COMPARISON:  None. FINDINGS: There is no evidence of pelvic fracture or diastasis.  Femoral heads are seated within their acetabular components. No pelvic bone lesions are seen. Left L5-S1 assimilation joint. 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 IMPRESSION: 1. No acute fracture of the pelvis. 2. 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 3. L5-S1 left-sided assimilation joint. Electronically Signed   By: Ashley Royalty M.D.   On: 02/01/2018 18:46   Dg Tibia/fibula Left  Result Date: 02/01/2018 CLINICAL DATA:  Left leg pain after slip in the bathroom today. EXAM: LEFT TIBIA AND FIBULA - 2 VIEW COMPARISON:  None. FINDINGS: Partially visualized femoral diaphyseal fracture with lateral displacement of the distal fracture fragment. Tibial arteriosclerosis is noted. Mild joint space narrowing of the femorotibial compartment. No joint dislocation at the knee nor ankle joint. Subtalar joint is maintained as are the midfoot articulations. Soft tissues are unremarkable. IMPRESSION: Partially included femoral diaphyseal fracture with lateral displacement 1 shaft width of the distal fracture fragment. No tibial nor fibular fracture. Electronically Signed   By: Ashley Royalty M.D.   On: 02/01/2018 18:50   Dg C-arm 1-60 Min  Result Date: 02/02/2018 CLINICAL DATA:  LEFT FEMUR INTRAMEDULLARY (IM) NAIL For Left Femur Fracture. Fluoro Time is 1 min 5 secs. EXAM: LEFT FEMUR 2 VIEWS; DG C-ARM 61-120 MIN COMPARISON:  02/01/2018 FINDINGS: The displaced spiral fracture of mid to distal left femoral shaft has been reduced with an intramedullary rod. The fracture is in near anatomic alignment. The orthopedic hardware appears well seated. IMPRESSION: Well-aligned left femur fracture following ORIF. Electronically Signed   By: Lajean Manes M.D.   On: 02/02/2018 15:34   Dg Femur Min 2 Views Left  Result Date: 02/02/2018 CLINICAL DATA:  LEFT FEMUR INTRAMEDULLARY (IM) NAIL For Left Femur Fracture. Fluoro Time is 1 min 5  secs. EXAM: LEFT FEMUR 2 VIEWS; DG C-ARM 61-120 MIN COMPARISON:  02/01/2018 FINDINGS: The displaced spiral fracture of mid to distal left femoral shaft has been reduced with an intramedullary rod. The fracture is in near anatomic alignment. The orthopedic hardware appears well seated. IMPRESSION: Well-aligned left femur fracture following ORIF. Electronically Signed   By: Lajean Manes M.D.   On: 02/02/2018 15:34   Dg Femur Min 2 Views Left  Result Date: 02/01/2018 CLINICAL DATA:  Left leg pain after fall in bathroom today. EXAM: LEFT FEMUR 2 VIEWS COMPARISON:  None. FINDINGS: There is an acute spiral oblique fracture of the left femoral diaphysis with 1 shaft width lateral and anterior displacement of the distal fracture fragment as well as 90 degrees of external rotation and 4 cm of foreshortening. No intra-articular extension is identified. No joint dislocation at the hip or knee. IMPRESSION: Acute  spiral oblique fracture of the left femoral diaphysis with 1 shaft with anterior and lateral displacement of the distal fracture fragment with 90 degrees of external rotation and 4 cm of foreshortening/override. Electronically Signed   By: Ashley Royalty M.D.   On: 02/01/2018 18:53   Dg Femur Port Min 2 Views Left  Result Date: 02/02/2018 CLINICAL DATA:  Left femur fracture ORIF. EXAM: LEFT FEMUR PORTABLE 2 VIEWS COMPARISON:  Left femur x-rays from yesterday. FINDINGS: Near anatomic alignment of the distal femoral diaphyseal spiral fracture status post intramedullary nail placement. No evidence of hardware complication. No new acute abnormality. IMPRESSION: Near anatomic alignment of the distal femur fracture status post ORIF. Electronically Signed   By: Titus Dubin M.D.   On: 02/02/2018 19:59    Microbiology: Recent Results (from the past 240 hour(s))  Surgical pcr screen     Status: None   Collection Time: 02/01/18 10:01 PM  Result Value Ref Range Status   MRSA, PCR NEGATIVE NEGATIVE Final    Staphylococcus aureus NEGATIVE NEGATIVE Final    Comment: (NOTE) The Xpert SA Assay (FDA approved for NASAL specimens in patients 61 years of age and older), is one component of a comprehensive surveillance program. It is not intended to diagnose infection nor to guide or monitor treatment. Performed at [facility], [place] [address], [place], [place] [address]      Labs: Basic Metabolic Panel: Recent Labs  Lab 02/01/18 1733 02/02/18 1624 02/03/18 0617  NA 140  --  139  K 4.0  --  3.6  CL 106  --  107  CO2 22  --  24  GLUCOSE 234*  --  128*  BUN 24*  --  16  CREATININE 1.21* 0.97 0.93  CALCIUM 9.5  --  8.1*   Liver Function Tests: No results for input(s): AST, ALT, ALKPHOS, BILITOT, PROT, ALBUMIN in the last 168 hours. No results for input(s): LIPASE, AMYLASE in the last 168 hours. No results for input(s): AMMONIA in the last 168 hours. CBC: Recent Labs  Lab 02/01/18 1733 02/02/18 0558 02/03/18 0617 02/06/18 0659  WBC 17.0* 8.9 7.5 8.8  NEUTROABS 14.7*  --   --   --   HGB 13.0 11.3* 9.3* 10.4*  HCT 40.0 35.2* 28.7* 32.0*  MCV 93.5 93.6 94.4 94.7  PLT 206 190 138* 232   Cardiac Enzymes: No results for input(s): CKTOTAL, CKMB, CKMBINDEX, TROPONINI in the last 168 hours. BNP: BNP (last 3 results) No results for input(s): BNP in the last 8760 hours.  ProBNP (last 3 results) No results for input(s): PROBNP in the last 8760 hours.  CBG: Recent Labs  Lab 02/05/18 1616 02/05/18 2107 02/06/18 0641 02/06/18 0823 02/06/18 1139  GLUCAP 183* 197* 142* 218* 149*       Signed:  Kayleen Memos, MD [facility] 02/06/2018, 12:27 PM

## 2018-02-06 NOTE — Care Management Important Message (Signed)
Important Message  Patient Details  Name: Rhonda Contreras MRN: [id number] Date of Birth: 07-Sep-1951   Medicare Important Message Given:  Yes    Shuree Brossart [address] 02/06/2018, 1:18 PM

## 2018-02-07 LAB — GLUCOSE, CAPILLARY
GLUCOSE-CAPILLARY: 238 mg/dL — AB (ref 65–99)
GLUCOSE-CAPILLARY: 223 mg/dL — AB (ref 65–99)
GLUCOSE-CAPILLARY: 143 mg/dL — AB (ref 65–99)
GLUCOSE-CAPILLARY: 160 mg/dL — AB (ref 65–99)
Glucose-Capillary: 148 mg/dL — ABNORMAL HIGH (ref 65–99)

## 2018-02-07 LAB — PROTIME-INR
INR: 2
Prothrombin Time: 22.5 seconds — ABNORMAL HIGH (ref 11.4–15.2)

## 2018-02-07 MED ORDER — WARFARIN SODIUM 5 MG PO TABS
5.0000 mg | ORAL_TABLET | Freq: Once | ORAL | Status: AC
  Administered 2018-02-07: 5 mg via ORAL
  Filled 2018-02-07: qty 1

## 2018-02-07 NOTE — Discharge Summary (Signed)
Discharge Summary  Rhonda Contreras DOB: 20-Sep-1951  PCP: Rhonda Low, MD  Admit date: 02/01/2018 Discharge date: 02/07/2018  Time spent: 25 minutes  UPDATE: Discharge delayed due to awaiting insurance approval for placement to SNF for short-term rehab.  02/07/2018: Patient seen and examined at bedside.  She has no new complaints.  Medical management is essentially the same.  Recommendations for Outpatient Follow-up:  1. Follow-up with PCP 2. Continue PT at SNF 3. Fall precautions 4. Take your medications as prescribed  Discharge Diagnoses:  Active Hospital Problems   Diagnosis Date Noted  . Other fracture of left femur, initial encounter for closed fracture ([facility]) 02/01/2018  . Diabetes mellitus type 2 in nonobese ([facility]) 02/02/2018  . Chronic anticoagulation 02/02/2018  . Essential hypertension 02/02/2018  . History of CVA (cerebrovascular accident) 02/02/2018    Resolved Hospital Problems  No resolved problems to display.    Discharge Condition: Stable  Diet recommendation: Resume previous diet  Vitals:   02/07/18 0449 02/07/18 1236  BP: (!) 144/69 119/65  Pulse: 86 80  Resp:  14  Temp: 98.3 F (36.8 C)   SpO2: 94% 99%    History of present illness:  Rhonda Gabor Rossis a 67 y.o.femalewith medical history significant ofHTN, lupus, DM type II, CVAon chronic anticoagulation, and osteoporosis;who presents after having a fall at home with left leg pain.Patient provides her own history and at baseline patient lives alone. Shewas in the bathroom getting ready to go out when her slipper slipped from underneath her and she fell landing onto her left leg. Denies any trauma to her head or loss of consciousness. She was able to crawl to her cell phone to call for EMS. Reports having severe pain in the left leg unable to bear weight.Was unable to try anythin  G to relieve pain symptoms. Otherwise,patient notes being in her normal state of health and  had recently been started on Fosamax for osteoporosis within the last month. Her blood sugars are normally Contreras and her last hemoglobin A1c was less than 7.  POD #3 post left femur intramedullary nail.  Pain is well controlled on current pain management.  Admitted to constipation on opiates.  Start bowel regimen with MiraLAX daily.  02/06/18: Patient seen and examined at her bedside.  She denies any pain, dyspnea or palpitation.  Patient is POD #4 post left femur intramedullary nail.  On the day of discharge patient was hemodynamically stable.  Patient will need to continue PT at SNF.  Hospital Course:  Principal Problem:   Other fracture of left femur, initial encounter for closed fracture [name]) Active Problems:   Diabetes mellitus type 2 in nonobese [facility])   Chronic anticoagulation   Essential hypertension   History of CVA (cerebrovascular accident)  Left femur fracture secondary to mechanical fall POD #4 post left femur intramedullary nail Continue pain management with as needed hydrocodone and Robaxin for muscle spasm Continue MiraLAX daily Continue Dulcolax as needed Continue PT at SNF  Type 2 diabetes Resume home medications Obtain A1c Last A1c was 9.0 on 05/15/2008  History of CVA on chronic anticoagulation On Coumadin Managed by pharmacy Last INR 2.12 on 02/06/2018  Acute blood loss anemia Hemoglobin on presentation 13.0 Hemoglobin 10.4 from 9.3 yesterday No sign of overt bleeding  HTN BP is well controlled C/w metoprolol and amlodipine   Procedures:  POD #5 post left femur intramedullary nail  Consultations:  Orthopedic surgery  Discharge Exam: BP 119/65 (BP Location: Left Arm)   Pulse  80   Temp 98.3 F (36.8 C) (Oral)   Resp 14   Ht 4\' 6"  (1.372 m)   Wt 59 kg (130 lb)   SpO2 99%   BMI 31.34 kg/m   General: 67 year old Caucasian female well-developed well-nourished in no acute distress.  Alert and oriented x3 Cardiovascular: Regular rate and  rhythm with no rubs or gallops.  No JVD or thyromegaly present.  Clear to auscultation with no wheezes or rales.   Respiratory: Clear to auscultation with no wheezes or rales.  Good inspiratory effort.  Discharge Instructions You were cared for by a hospitalist during your hospital stay. If you have any questions about your discharge medications or the care you received while you were in the hospital after you are discharged, you can call the unit and asked to speak with the hospitalist on call if the hospitalist that took care of you is not available. Once you are discharged, your primary care physician will handle any further medical issues. Please note that NO REFILLS for any discharge medications will be authorized once you are discharged, as it is imperative that you return to your primary care physician (or establish a relationship with a primary care physician if you do not have one) for your aftercare needs so that they can reassess your need for medications and monitor your lab values.   Allergies as of 02/07/2018   No Known Allergies     Medication List    STOP taking these medications   lisinopril-hydrochlorothiazide 10-12.5 MG tablet Commonly known as:  PRINZIDE,ZESTORETIC     TAKE these medications   alendronate 70 MG tablet Commonly known as:  FOSAMAX Take 70 mg by mouth daily.   amLODipine 10 MG tablet Commonly known as:  NORVASC Take 10 mg by mouth daily.   atorvastatin 20 MG tablet Commonly known as:  LIPITOR Take 20 mg by mouth daily.   docusate sodium 100 MG capsule Commonly known as:  COLACE Take 1 capsule (100 mg total) by mouth 2 (two) times daily. To prevent constipation while taking pain medication.   glimepiride 1 MG tablet Commonly known as:  AMARYL Take 1 mg by mouth daily.   HYDROcodone-acetaminophen 5-325 MG tablet Commonly known as:  NORCO Take 1-2 tablets by mouth every 6 (six) hours as needed for moderate pain.   ketoconazole 2 %  cream Commonly known as:  NIZORAL Apply 1 application topically daily. On her face   metFORMIN 1000 MG tablet Commonly known as:  GLUCOPHAGE Take 1,000 mg by mouth at bedtime.   metoprolol tartrate 100 MG tablet Commonly known as:  LOPRESSOR Take 100 mg by mouth 2 (two) times daily.   polyethylene glycol packet Commonly known as:  MIRALAX / GLYCOLAX Take 17 g by mouth daily.   warfarin 5 MG tablet Commonly known as:  COUMADIN Take 5 mg by mouth daily at 6 PM. Sunday and Tuesday 5 mg  2.5 mg all the other day      No Known Allergies  Contact information for follow-up providers    Renette Butters, MD Follow up in 2 week(s).   Specialty:  Orthopedic Surgery Contact information: [address] [place] [address] [phone]        Rhonda Low, MD Follow up in 1 week(s).   Specialty:  Internal Medicine Contact information: [address] [place] [address] [phone]            Contact information for after-discharge care  Destination    [facility] .   Service:  Skilled Nursing Contact information: [address] [place] [phone]                   The results of significant diagnostics from this hospitalization (including imaging, microbiology, ancillary and laboratory) are listed below for reference.    Significant Diagnostic Studies: Dg Chest 1 View  Result Date: 02/01/2018 CLINICAL DATA:  Preop chest for left femoral fracture. EXAM: CHEST  1 VIEW COMPARISON:  None. FINDINGS: Cardiomegaly with ectatic thoracic aorta. Atelectasis noted at each lung base. No pulmonary edema or focal pulmonary consolidation. Remote left-sided rib fractures. No acute osseous abnormality of the bony thorax. IMPRESSION: Cardiomegaly without active pulmonary disease. Electronically Signed   By: Ashley Royalty M.D.   On: 02/01/2018 18:59   Dg Pelvis 1-2 Views  Result Date:  02/01/2018 CLINICAL DATA:  Left hip pain after slip and fall in bathtub today. EXAM: PELVIS - 1-2 VIEW COMPARISON:  None. FINDINGS: There is no evidence of pelvic fracture or diastasis. Femoral heads are seated within their acetabular components. No pelvic bone lesions are seen. Left L5-S1 assimilation joint. 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 IMPRESSION: 1. No acute fracture of the pelvis. 2. 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 3. L5-S1 left-sided assimilation joint. Electronically Signed   By: Ashley Royalty M.D.   On: 02/01/2018 18:46   Dg Tibia/fibula Left  Result Date: 02/01/2018 CLINICAL DATA:  Left leg pain after slip in the bathroom today. EXAM: LEFT TIBIA AND FIBULA - 2 VIEW COMPARISON:  None. FINDINGS: Partially visualized femoral diaphyseal fracture with lateral displacement of the distal fracture fragment. Tibial arteriosclerosis is noted. Mild joint space narrowing of the femorotibial compartment. No joint dislocation at the knee nor ankle joint. Subtalar joint is maintained as are the midfoot articulations. Soft tissues are unremarkable. IMPRESSION: Partially included femoral diaphyseal fracture with lateral displacement 1 shaft width of the distal fracture fragment. No tibial nor fibular fracture. Electronically Signed   By: Ashley Royalty M.D.   On: 02/01/2018 18:50   Dg C-arm 1-60 Min  Result Date: 02/02/2018 CLINICAL DATA:  LEFT FEMUR INTRAMEDULLARY (IM) NAIL For Left Femur Fracture. Fluoro Time is 1 min 5 secs. EXAM: LEFT FEMUR 2 VIEWS; DG C-ARM 61-120 MIN COMPARISON:  02/01/2018 FINDINGS: The displaced spiral fracture of mid to distal left femoral shaft has been reduced with an intramedullary rod. The fracture is in near anatomic alignment. The orthopedic hardware appears well seated. IMPRESSION: Well-aligned left femur fracture following ORIF. Electronically Signed   By: Lajean Manes  M.D.   On: 02/02/2018 15:34   Dg Femur Min 2 Views Left  Result Date: 02/02/2018 CLINICAL DATA:  LEFT FEMUR INTRAMEDULLARY (IM) NAIL For Left Femur Fracture. Fluoro Time is 1 min 5 secs. EXAM: LEFT FEMUR 2 VIEWS; DG C-ARM 61-120 MIN COMPARISON:  02/01/2018 FINDINGS: The displaced spiral fracture of mid to distal left femoral shaft has been reduced with an intramedullary rod. The fracture is in near anatomic alignment. The orthopedic hardware appears well seated. IMPRESSION: Well-aligned left femur fracture following ORIF. Electronically Signed   By: Lajean Manes M.D.   On: 02/02/2018 15:34   Dg Femur Min 2 Views Left  Result Date: 02/01/2018 CLINICAL DATA:  Left leg pain after fall in bathroom today. EXAM: LEFT FEMUR 2 VIEWS COMPARISON:  None. FINDINGS: There is an acute spiral oblique fracture of the left  femoral diaphysis with 1 shaft width lateral and anterior displacement of the distal fracture fragment as well as 90 degrees of external rotation and 4 cm of foreshortening. No intra-articular extension is identified. No joint dislocation at the hip or knee. IMPRESSION: Acute spiral oblique fracture of the left femoral diaphysis with 1 shaft with anterior and lateral displacement of the distal fracture fragment with 90 degrees of external rotation and 4 cm of foreshortening/override. Electronically Signed   By: Ashley Royalty M.D.   On: 02/01/2018 18:53   Dg Femur Port Min 2 Views Left  Result Date: 02/02/2018 CLINICAL DATA:  Left femur fracture ORIF. EXAM: LEFT FEMUR PORTABLE 2 VIEWS COMPARISON:  Left femur x-rays from yesterday. FINDINGS: Near anatomic alignment of the distal femoral diaphyseal spiral fracture status post intramedullary nail placement. No evidence of hardware complication. No new acute abnormality. IMPRESSION: Near anatomic alignment of the distal femur fracture status post ORIF. Electronically Signed   By: Titus Dubin M.D.   On: 02/02/2018 19:59    Microbiology: Recent Results  (from the past 240 hour(s))  Surgical pcr screen     Status: None   Collection Time: 02/01/18 10:01 PM  Result Value Ref Range Status   MRSA, PCR NEGATIVE NEGATIVE Final   Staphylococcus aureus NEGATIVE NEGATIVE Final    Comment: (NOTE) The Xpert SA Assay (FDA approved for NASAL specimens in patients 20 years of age and older), is one component of a comprehensive surveillance program. It is not intended to diagnose infection nor to guide or monitor treatment. Performed at [facility], [place] [address], [place],  [address]      Labs: Basic Metabolic Panel: Recent Labs  Lab 02/01/18 1733 02/02/18 1624 02/03/18 0617  NA 140  --  139  K 4.0  --  3.6  CL 106  --  107  CO2 22  --  24  GLUCOSE 234*  --  128*  BUN 24*  --  16  CREATININE 1.21* 0.97 0.93  CALCIUM 9.5  --  8.1*   Liver Function Tests: No results for input(s): AST, ALT, ALKPHOS, BILITOT, PROT, ALBUMIN in the last 168 hours. No results for input(s): LIPASE, AMYLASE in the last 168 hours. No results for input(s): AMMONIA in the last 168 hours. CBC: Recent Labs  Lab 02/01/18 1733 02/02/18 0558 02/03/18 0617 02/06/18 0659  WBC 17.0* 8.9 7.5 8.8  NEUTROABS 14.7*  --   --   --   HGB 13.0 11.3* 9.3* 10.4*  HCT 40.0 35.2* 28.7* 32.0*  MCV 93.5 93.6 94.4 94.7  PLT 206 190 138* 232   Cardiac Enzymes: No results for input(s): CKTOTAL, CKMB, CKMBINDEX, TROPONINI in the last 168 hours. BNP: BNP (last 3 results) No results for input(s): BNP in the last 8760 hours.  ProBNP (last 3 results) No results for input(s): PROBNP in the last 8760 hours.  CBG: Recent Labs  Lab 02/06/18 1139 02/06/18 1825 02/06/18 2118 02/07/18 0627 02/07/18 1140  GLUCAP 149* 238* 155* 148* 223*       Signed:  Kayleen Memos, MD [facility] 02/07/2018, 2:51 PM

## 2018-02-07 NOTE — Plan of Care (Signed)

## 2018-02-07 NOTE — Social Work (Signed)
CSW was advised by SNF that [facility] has indicated that they may not have [employer] fast streamer until tomorrow. CSW will continue to follow and advise Dr. Nevada Crane of same. CSW is hoping that patient gets approved for SNF as [place] Medicare sometimes takes longer when they are not sure if they will approve SNF placement.  CSW will continue to f/u for disposition.  Elissa Hefty, LCSW Clinical Social Worker [phone]

## 2018-02-07 NOTE — Progress Notes (Signed)
ANTICOAGULATION CONSULT NOTE - Follow Up Consult  Pharmacy Consult:  Coumadin Indication: History of CVA  No Known Allergies  Patient Measurements: Height: 4\' 6"  (137.2 cm) Weight: 130 lb (59 kg) IBW/kg (Calculated) : 31.7  Vital Signs: Temp: 98.3 F (36.8 C) (04/16 0449) Temp Source: Oral (04/16 0449) BP: 144/69 (04/16 0449) Pulse Rate: 86 (04/16 0449)  Labs: Recent Labs    02/05/18 0706 02/06/18 0659 02/07/18 0444  HGB  --  10.4*  --   HCT  --  32.0*  --   PLT  --  232  --   LABPROT 22.4* 23.6* 22.5*  INR 1.99 2.12 2.00    Estimated Creatinine Clearance: 38.9 mL/min (by C-G formula based on SCr of 0.93 mg/dL).  Assessment: Rhonda Contreras on Coumadin PTA for history of a CVA (2009).  Patient was admitted 02/01/18 with fall and femur fracture s/p IM nailing on 02/02/18. Coumadin was held 4/10 and 4/11 and resumed on 02/03/18.  INR therapeutic; no bleeding reported.   Goal of Therapy:  INR 2-3 Monitor platelets by anticoagulation protocol: Yes    Plan:  Resume home Coumadin dose:  2.5mg  PO daily except 5mg  on Tues / Sun Daily PT / INR   Amahri Dengel D. Mina Marble, PharmD, BCPS Pager:  [phone] 02/07/2018, 9:10 AM

## 2018-02-08 DIAGNOSIS — E119 Type 2 diabetes mellitus without complications: Secondary | ICD-10-CM

## 2018-02-08 DIAGNOSIS — W19XXXD Unspecified fall, subsequent encounter: Secondary | ICD-10-CM

## 2018-02-08 DIAGNOSIS — S72342D Displaced spiral fracture of shaft of left femur, subsequent encounter for closed fracture with routine healing: Secondary | ICD-10-CM

## 2018-02-08 LAB — GLUCOSE, CAPILLARY
GLUCOSE-CAPILLARY: 158 mg/dL — AB (ref 65–99)
Glucose-Capillary: 143 mg/dL — ABNORMAL HIGH (ref 65–99)
Glucose-Capillary: 173 mg/dL — ABNORMAL HIGH (ref 65–99)
Glucose-Capillary: 153 mg/dL — ABNORMAL HIGH (ref 65–99)

## 2018-02-08 LAB — PROTIME-INR
INR: 2.17
PROTHROMBIN TIME: 24 s — AB (ref 11.4–15.2)

## 2018-02-08 MED ORDER — WARFARIN SODIUM 5 MG PO TABS: 5.0000 mg | ORAL_TABLET | ORAL | Status: DC

## 2018-02-08 MED ORDER — WARFARIN SODIUM 2.5 MG PO TABS
2.5000 mg | ORAL_TABLET | ORAL | Status: DC
  Administered 2018-02-08 – 2018-02-09 (×2): 2.5 mg via ORAL
  Filled 2018-02-08 (×2): qty 1

## 2018-02-08 NOTE — Progress Notes (Signed)
ANTICOAGULATION CONSULT NOTE - Follow Up Consult  Pharmacy Consult:  Coumadin Indication: History of CVA  No Known Allergies  Patient Measurements: Height: 4\' 6"  (137.2 cm) Weight: 130 lb (59 kg) IBW/kg (Calculated) : 31.7  Vital Signs: Temp: 98.8 F (37.1 C) (04/17 0632) Temp Source: Oral (04/17 0623) BP: 126/64 (04/17 7628) Pulse Rate: 83 (04/17 0632)  Labs: Recent Labs    02/06/18 0659 02/07/18 0444 02/08/18 0523  HGB 10.4*  --   --   HCT 32.0*  --   --   PLT 232  --   --   LABPROT 23.6* 22.5* 24.0*  INR 2.12 2.00 2.17    Estimated Creatinine Clearance: 38.9 mL/min (by C-G formula based on SCr of 0.93 mg/dL).  Assessment: Rhonda Contreras on Coumadin PTA for history of a CVA (2009).  Patient was admitted 02/01/18 with fall and femur fracture s/p IM nailing on 02/02/18. Coumadin was held 4/10 and 4/11 and resumed on 02/03/18.  INR therapeutic; no bleeding reported.   Goal of Therapy:  INR 2-3 Monitor platelets by anticoagulation protocol: Yes    Plan:  Resume home Coumadin dose:  2.5mg  PO daily except 5mg  on Tues / Sun Daily PT / INR   Kahleb Mcclane D. Mina Marble, PharmD, BCPS Pager:  [phone] 02/08/2018, 9:51 AM

## 2018-02-08 NOTE — Progress Notes (Signed)
Physical Therapy Treatment Patient Details Name: Rhonda Contreras MRN: [id number] DOB: Jan 15, 1951 Today's Date: 02/08/2018    History of Present Illness 67 y.o. female admitted on 02/01/18 s/p fall with L femur fx s/p ORIF with post op TDWB status.  Pt with significant PMH of stroke in 2009 with mild residual R sided weakness, renal disorder, lupus, immune difficiency disorder, HTN, DM, and congenital deformity L thumb, osteoporosis ( brittle bones).      PT Comments    Pt continues to present with poor safety awareness in regards to weight bearing status.  Pt starts off with correct technique but with fatigue and weakness if becomes difficult for her to maintain weight bearing.  At baseline patient walking without device and now heavily reliant on UE support for balance.  She is at an increased risk for repeated falls and would benefit from skilled rehab at SNF short term to improve strength and function and return home in a more independent manner.  Plan next session for continued strengthening and emphasis on safety with gt training.     Follow Up Recommendations  SNF     Equipment Recommendations  Rolling walker with 5" wheels    Recommendations for Other Services       Precautions / Restrictions Precautions Precautions: Fall Precaution Comments: fall outside last year Restrictions Weight Bearing Restrictions: Yes LLE Weight Bearing: Touchdown weight bearing    Mobility  Bed Mobility               General bed mobility comments: Pt in recliner on arrival.    Transfers Overall transfer level: Needs assistance Equipment used: Rolling walker (2 wheeled) Transfers: Sit to/from Stand Sit to Stand: Min guard         General transfer comment: Cues for hand placement as patient attempts to pull on RW into standing.  Educated this is not safe practice.    Ambulation/Gait Ambulation/Gait assistance: Min guard Ambulation Distance (Feet): 30 Feet Assistive device: Rolling  walker (2 wheeled) Gait Pattern/deviations: Step-to pattern;Antalgic;Trunk flexed Gait velocity: decreased   General Gait Details: Pt with cues for weight bearing status.  As she fatigues she requires increased cueing to avoid weight bearing on L and rely on L forefoot for balance only.  Pt required cues for RW safety.     Stairs             Wheelchair Mobility    Modified Rankin (Stroke Patients Only)       Balance             Standing balance-Leahy Scale: Poor Standing balance comment: Heavily reliant on L TDWB for balance.                              Cognition Arousal/Alertness: Awake/alert Behavior During Therapy: WFL for tasks assessed/performed Overall Cognitive Status: Within Functional Limits for tasks assessed                                 General Comments: Very pleasant and engaged in therapy      Exercises Total Joint Exercises Heel Slides: AROM;Left;10 reps;Supine Long Arc Quad: AROM;Left;10 reps;Seated    General Comments        Pertinent Vitals/Pain Pain Assessment: 0-10 Pain Score: 1  Pain Descriptors / Indicators: Aching Pain Intervention(s): Monitored during session;Repositioned    Home Living  Prior Function            PT Goals (current goals can now be found in the care plan section) Acute Rehab PT Goals Patient Stated Goal: to get strong enough to get home to her dog, [name] Potential to Achieve Goals: Good Progress towards PT goals: Progressing toward goals    Frequency    Min 3X/week      PT Plan Current plan remains appropriate    Co-evaluation              AM-PAC PT "6 Clicks" Daily Activity  Outcome Measure  Difficulty turning over in bed (including adjusting bedclothes, sheets and blankets)?: Unable Difficulty moving from lying on back to sitting on the side of the bed? : Unable Difficulty sitting down on and standing up from a chair with arms  (e.g., wheelchair, bedside commode, etc,.)?: Unable Help needed moving to and from a bed to chair (including a wheelchair)?: A Little Help needed walking in hospital room?: A Little Help needed climbing 3-5 steps with a railing? : Total 6 Click Score: 10    End of Session Equipment Utilized During Treatment: Gait belt Activity Tolerance: Patient tolerated treatment well Patient left: with call bell/phone within reach;in bed Nurse Communication: Mobility status PT Visit Diagnosis: Muscle weakness (generalized) (M62.81);Difficulty in walking, not elsewhere classified (R26.2);Pain Pain - Right/Left: Left Pain - part of body: Leg     Time: 1216-1231 PT Time Calculation (min) (ACUTE ONLY): 15 min  Charges:  $Gait Training: 8-22 mins                    G Codes:       Governor Rooks, PTA pager [phone]    Cristela Blue 02/08/2018, 12:31 PM

## 2018-02-08 NOTE — Social Work (Signed)
CSW was advised that [facility] needs up to date clinicals notes from PT. CSW f/u with PT on same.  Insurance Josem Kaufmann is still pending.  CSW f/u.  Elissa Hefty, LCSW Clinical Social Worker [phone]

## 2018-02-08 NOTE — Progress Notes (Signed)
PROGRESS NOTE  Rhonda Contreras IRJ:[id number] DOB: 06-Feb-1951 DOA: 02/01/2018 PCP: Wenda Low, MD  HPI/Recap of past 24 hours: Rhonda Contreras is a 67 y.o. female with medical history significant of HTN, lupus, DM type II, CVA on chronic anticoagulation, and osteoporosis; who presents after having a fall at home with left leg pain.  Patient provides her own history and at baseline patient lives alone. She was in the bathroom getting ready to go out when her slipper slipped from underneath her and she fell landing onto her left leg.  Denies any trauma to her head or loss of consciousness.  She was able to crawl to her cell phone to call for EMS.  Reports having severe pain in the left leg unable to bear weight.  Was unable to try anything to relieve pain symptoms.  Otherwise, patient notes being in her normal state of health and had recently been started on Fosamax for osteoporosis within the last month.  Her blood sugars are normally low and her last hemoglobin A1c was less than 7.  Patient was admitted, and underwent left femur intramedullary nail.  Patient is awaiting disposition.  Patient will be discharged once the patient's insurance comes through.  Patient is stable for discharge from medical point.  Assessment/Plan: Principal Problem:   Closed displaced spiral fracture of shaft of left femur (HCC) Active Problems:   Diabetes mellitus type 2 in nonobese (HCC)   Chronic anticoagulation   Essential hypertension   History of CVA (cerebrovascular accident)  Left femur fracture secondary to mechanical fall: Status post left femur intramedullary nail Pursue discharge to SNF pending insurance approval  Type 2 diabetes: Patient is on sliding scale insulin coverage. Blood sugar control is optimized.    History of CVA on chronic anticoagulation: On Coumadin Managed by pharmacy INR is 2.17 today.    Acute blood loss anemia: Hemoglobin on presentation 13.0 Hemoglobin was 10.4 on  02/06/2018. Stable.   Code Status: Full code  Family Communication:   Disposition Plan: SNF when approval of insurance is completed   Consultants: Orthopedic surgery   Procedures: left femur intramedullary nail  Antimicrobials:  None  DVT prophylaxis: SCDs, coumadin   Objective: Vitals:   02/07/18 1236 02/07/18 2203 02/08/18 0632 02/08/18 1509  BP: 119/65 133/75 126/64 117/76  Pulse: 80 92 83 87  Resp: 14 18 16    Temp:  99 F (37.2 C) 98.8 F (37.1 C) 98.7 F (37.1 C)  TempSrc:  Oral Oral Oral  SpO2: 99% 96% 96% 98%  Weight:      Height:        Intake/Output Summary (Last 24 hours) at 02/08/2018 1604 Last data filed at 02/07/2018 1900 Gross per 24 hour  Intake 240 ml  Output -  Net 240 ml   Filed Weights   02/01/18 1715  Weight: 59 kg (130 lb)    Exam:   General: 67 year old Caucasian female well-developed well-nourished no acute distress.  Alert and oriented x3  Cardiovascular: S1-S2  Respiratory: Clear to auscultation  Abdomen: Obese, soft and nontender.  Organs are difficult to assess.    Musculoskeletal: Mild edema in the left lower extremity.  Site of surgery is covered with surgical wrap  Psychiatry: Mood is appropriate for condition and setting   Data Reviewed: CBC: Recent Labs  Lab 02/01/18 1733 02/02/18 0558 02/03/18 0617 02/06/18 0659  WBC 17.0* 8.9 7.5 8.8  NEUTROABS 14.7*  --   --   --   HGB 13.0 11.3* 9.3*  10.4*  HCT 40.0 35.2* 28.7* 32.0*  MCV 93.5 93.6 94.4 94.7  PLT 206 190 138* 397   Basic Metabolic Panel: Recent Labs  Lab 02/01/18 1733 02/02/18 1624 02/03/18 0617  NA 140  --  139  K 4.0  --  3.6  CL 106  --  107  CO2 22  --  24  GLUCOSE 234*  --  128*  BUN 24*  --  16  CREATININE 1.21* 0.97 0.93  CALCIUM 9.5  --  8.1*   GFR: Estimated Creatinine Clearance: 38.9 mL/min (by C-G formula based on SCr of 0.93 mg/dL). Liver Function Tests: No results for input(s): AST, ALT, ALKPHOS, BILITOT, PROT, ALBUMIN in  the last 168 hours. No results for input(s): LIPASE, AMYLASE in the last 168 hours. No results for input(s): AMMONIA in the last 168 hours. Coagulation Profile: Recent Labs  Lab 02/04/18 0544 02/05/18 0706 02/06/18 0659 02/07/18 0444 02/08/18 0523  INR 1.68 1.99 2.12 2.00 2.17   Cardiac Enzymes: No results for input(s): CKTOTAL, CKMB, CKMBINDEX, TROPONINI in the last 168 hours. BNP (last 3 results) No results for input(s): PROBNP in the last 8760 hours. HbA1C: No results for input(s): HGBA1C in the last 72 hours. CBG: Recent Labs  Lab 02/07/18 1140 02/07/18 1535 02/07/18 2151 02/08/18 0635 02/08/18 1130  GLUCAP 223* 143* 160* 158* 143*   Lipid Profile: No results for input(s): CHOL, HDL, LDLCALC, TRIG, CHOLHDL, LDLDIRECT in the last 72 hours. Thyroid Function Tests: No results for input(s): TSH, T4TOTAL, FREET4, T3FREE, THYROIDAB in the last 72 hours. Anemia Panel: No results for input(s): VITAMINB12, FOLATE, FERRITIN, TIBC, IRON, RETICCTPCT in the last 72 hours. Urine analysis:    Component Value Date/Time   COLORURINE YELLOW 05/16/2008 0330   APPEARANCEUR CLEAR 05/16/2008 0330   LABSPEC 1.013 05/16/2008 0330   PHURINE 6.0 05/16/2008 0330   GLUCOSEU 500 (A) 05/16/2008 0330   HGBUR NEGATIVE 05/16/2008 0330   BILIRUBINUR NEGATIVE 05/16/2008 0330   KETONESUR 15 (A) 05/16/2008 0330   PROTEINUR NEGATIVE 05/16/2008 0330   UROBILINOGEN 1.0 05/16/2008 0330   NITRITE NEGATIVE 05/16/2008 0330   LEUKOCYTESUR NEGATIVE 05/16/2008 0330   Sepsis Labs: @LABRCNTIP (procalcitonin:4,lacticidven:4)  ) Recent Results (from the past 240 hour(s))  Surgical pcr screen     Status: None   Collection Time: 02/01/18 10:01 PM  Result Value Ref Range Status   MRSA, PCR NEGATIVE NEGATIVE Final   Staphylococcus aureus NEGATIVE NEGATIVE Final    Comment: (NOTE) The Xpert SA Assay (FDA approved for NASAL specimens in patients 57 years of age and older), is one component of a  comprehensive surveillance program. It is not intended to diagnose infection nor to guide or monitor treatment. Performed at [facility], [place] [address] [place], [place] [address]       Studies: No results found.  Scheduled Meds: . amLODipine  10 mg Oral Daily  . atorvastatin  20 mg Oral Daily  . docusate sodium  100 mg Oral BID  . insulin aspart  0-5 Units Subcutaneous QHS  . insulin aspart  0-9 Units Subcutaneous TID WC  . ketoconazole  1 application Topical Daily  . metoprolol tartrate  100 mg Oral BID  . polyethylene glycol  17 g Oral Daily  . senna  1 tablet Oral Daily  . warfarin  2.5 mg Oral Once per day on Mon Wed Thu Fri Sat  . [START ON 02/12/2018] warfarin  5 mg Oral Once per day on Sun Tue  . Warfarin -  Pharmacist Dosing Inpatient   Does not apply q1800    Continuous Infusions: . lactated ringers Stopped (02/03/18 1500)  . methocarbamol (ROBAXIN)  IV       LOS: 7 days     Bonnell Public, MD [facility] Pager [phone]  If 7PM-7AM, please contact night-coverage [email or web address] Password [email or web address] 02/08/2018, 4:04 PM

## 2018-02-09 DIAGNOSIS — T466X5A Adverse effect of antihyperlipidemic and antiarteriosclerotic drugs, initial encounter: Secondary | ICD-10-CM | POA: Diagnosis not present

## 2018-02-09 DIAGNOSIS — Z8673 Personal history of transient ischemic attack (TIA), and cerebral infarction without residual deficits: Secondary | ICD-10-CM | POA: Diagnosis not present

## 2018-02-09 DIAGNOSIS — S8991XA Unspecified injury of right lower leg, initial encounter: Secondary | ICD-10-CM | POA: Diagnosis not present

## 2018-02-09 DIAGNOSIS — G8911 Acute pain due to trauma: Secondary | ICD-10-CM | POA: Diagnosis not present

## 2018-02-09 DIAGNOSIS — S72332A Displaced oblique fracture of shaft of left femur, initial encounter for closed fracture: Secondary | ICD-10-CM | POA: Diagnosis not present

## 2018-02-09 DIAGNOSIS — W19XXXA Unspecified fall, initial encounter: Secondary | ICD-10-CM | POA: Diagnosis not present

## 2018-02-09 DIAGNOSIS — R262 Difficulty in walking, not elsewhere classified: Secondary | ICD-10-CM | POA: Diagnosis not present

## 2018-02-09 DIAGNOSIS — Z9181 History of falling: Secondary | ICD-10-CM | POA: Diagnosis not present

## 2018-02-09 DIAGNOSIS — M810 Age-related osteoporosis without current pathological fracture: Secondary | ICD-10-CM | POA: Diagnosis not present

## 2018-02-09 DIAGNOSIS — S72452D Displaced supracondylar fracture without intracondylar extension of lower end of left femur, subsequent encounter for closed fracture with routine healing: Secondary | ICD-10-CM | POA: Diagnosis not present

## 2018-02-09 DIAGNOSIS — E119 Type 2 diabetes mellitus without complications: Secondary | ICD-10-CM | POA: Diagnosis not present

## 2018-02-09 DIAGNOSIS — S72342A Displaced spiral fracture of shaft of left femur, initial encounter for closed fracture: Secondary | ICD-10-CM | POA: Diagnosis not present

## 2018-02-09 DIAGNOSIS — R2681 Unsteadiness on feet: Secondary | ICD-10-CM | POA: Diagnosis not present

## 2018-02-09 DIAGNOSIS — R41841 Cognitive communication deficit: Secondary | ICD-10-CM | POA: Diagnosis not present

## 2018-02-09 DIAGNOSIS — S72342D Displaced spiral fracture of shaft of left femur, subsequent encounter for closed fracture with routine healing: Secondary | ICD-10-CM | POA: Diagnosis not present

## 2018-02-09 DIAGNOSIS — K5900 Constipation, unspecified: Secondary | ICD-10-CM | POA: Diagnosis not present

## 2018-02-09 DIAGNOSIS — R197 Diarrhea, unspecified: Secondary | ICD-10-CM | POA: Diagnosis not present

## 2018-02-09 DIAGNOSIS — W19XXXD Unspecified fall, subsequent encounter: Secondary | ICD-10-CM | POA: Diagnosis not present

## 2018-02-09 DIAGNOSIS — M79652 Pain in left thigh: Secondary | ICD-10-CM | POA: Diagnosis not present

## 2018-02-09 DIAGNOSIS — Z9889 Other specified postprocedural states: Secondary | ICD-10-CM | POA: Diagnosis not present

## 2018-02-09 DIAGNOSIS — S728X2A Other fracture of left femur, initial encounter for closed fracture: Secondary | ICD-10-CM | POA: Diagnosis not present

## 2018-02-09 DIAGNOSIS — I10 Essential (primary) hypertension: Secondary | ICD-10-CM | POA: Diagnosis not present

## 2018-02-09 DIAGNOSIS — I679 Cerebrovascular disease, unspecified: Secondary | ICD-10-CM | POA: Diagnosis not present

## 2018-02-09 DIAGNOSIS — M6281 Muscle weakness (generalized): Secondary | ICD-10-CM | POA: Diagnosis not present

## 2018-02-09 DIAGNOSIS — R2689 Other abnormalities of gait and mobility: Secondary | ICD-10-CM | POA: Diagnosis not present

## 2018-02-09 DIAGNOSIS — Z7901 Long term (current) use of anticoagulants: Secondary | ICD-10-CM | POA: Diagnosis not present

## 2018-02-09 LAB — CREATININE, SERUM
CREATININE: 0.78 mg/dL (ref 0.44–1.00)
GFR calc Af Amer: >60 mL/min (ref >60)
GFR calc non Af Amer: >60 mL/min (ref >60)

## 2018-02-09 LAB — GLUCOSE, CAPILLARY
GLUCOSE-CAPILLARY: 160 mg/dL — AB (ref 65–99)
GLUCOSE-CAPILLARY: 199 mg/dL — AB (ref 65–99)
Glucose-Capillary: 127 mg/dL — ABNORMAL HIGH (ref 65–99)

## 2018-02-09 LAB — PROTIME-INR
INR: 2.46
Prothrombin Time: 26.5 seconds — ABNORMAL HIGH (ref 11.4–15.2)

## 2018-02-09 NOTE — Clinical Social Work Placement (Signed)
   CLINICAL SOCIAL WORK PLACEMENT  NOTE  Date:  02/09/2018  Patient Details  Name: Rhonda Contreras MRN: [id number] Date of Birth: 18-May-1951  Clinical Social Work is seeking post-discharge placement for this patient at the [place] level of care (*CSW will initial, date and re-position this form in  chart as items are completed):  Yes   Patient/family provided with [place] Work Department's list of facilities offering this level of care within the geographic area requested by the patient (or if unable, by the patient's family).  Yes   Patient/family informed of their freedom to choose among providers that offer the needed level of care, that participate in Medicare, Medicaid or managed care program needed by the patient, have an available bed and are willing to accept the patient.  Yes   Patient/family informed of [facility]'s ownership interest in [facility] and [facility], as well as of the fact that they are under no obligation to receive care at these facilities.  PASRR submitted to EDS on 02/04/18     PASRR number received on 02/04/18     Existing PASRR number confirmed on       FL2 transmitted to all facilities in geographic area requested by pt/family on 02/04/18     FL2 transmitted to all facilities within larger geographic area on       Patient informed that his/her managed care company has contracts with or will negotiate with certain facilities, including the following:        Yes   Patient/family informed of bed offers received.  Patient chooses bed at [facility]     Physician recommends and patient chooses bed at      Patient to be transferred to [facility] on 02/09/18.  Patient to be transferred to facility by [facility]     Patient family notified on 02/09/18 of transfer.  Name of family member notified:  pt responsible for self     PHYSICIAN Please sign FL2     Additional Comment:     _______________________________________________ Normajean Baxter, LCSW 02/09/2018, 2:44 PM

## 2018-02-09 NOTE — Social Work (Signed)
Clinical Social Worker facilitated patient discharge including contacting patient family and facility to confirm patient discharge plans.  Clinical information faxed to facility and family agreeable with plan.    CSW arranged ambulance transport via [facility] to [facility].    RN to call [phone] to give report prior to discharge.  Clinical Social Worker will sign off for now as social work intervention is no longer needed. Please consult us again if new need arises.  Nikayla Madaris, LCSW Clinical Social Worker [phone]      

## 2018-02-09 NOTE — Discharge Summary (Addendum)
Discharge Summary  ECHO PROPP HAL:[id number] DOB: December 22, 1950  PCP: Wenda Low, MD  Admit date: 02/01/2018 Discharge date: 02/09/2018  Time spent: 25 minutes  Recommendations for Outpatient Follow-up:  1. Follow-up with PCP 2. Continue PT at SNF 3. Fall precautions 4. Take your medications as prescribed  Discharge Diagnoses:  Active Hospital Problems   Diagnosis Date Noted  . Closed displaced spiral fracture of shaft of left femur ([place]) 02/01/2018  . Diabetes mellitus type 2 in nonobese ([place]) 02/02/2018  . Chronic anticoagulation 02/02/2018  . Essential hypertension 02/02/2018  . History of CVA (cerebrovascular accident) 02/02/2018    Resolved Hospital Problems  No resolved problems to display.    Discharge Condition: Stable  Diet recommendation: Resume previous diet  Vitals:   02/09/18 0405 02/09/18 1233  BP: 136/65 116/62  Pulse: 77 76  Resp:  12  Temp: 98.7 F (37.1 C) 98.2 F (36.8 C)  SpO2: 95% 98%    History of present illness:  Rhonda Contreras a 67 y.o.femalewith medical history significant ofHTN, lupus, DM type II, CVAon chronic anticoagulation, and osteoporosis;who presents after having a fall at home with left leg pain.Patient provides her own history and at baseline patient lives alone. Shewas in the bathroom getting ready to go out when her slipper slipped from underneath her and she fell landing onto her left leg. Denies any trauma to her head or loss of consciousness. She was able to crawl to her cell phone to call for EMS. Reports having severe pain in the left leg unable to bear weight.Was unable to try anythin  G to relieve pain symptoms. Otherwise,patient notes being in her normal state of health and had recently been started on Fosamax for osteoporosis within the last month. Her blood sugars are normally low and her last hemoglobin A1c was less than 7.  POD #3 post left femur intramedullary nail.  Pain is well controlled on  current pain management.  Admitted to constipation on opiates.  Start bowel regimen with MiraLAX daily.  02/06/18: Patient seen and examined at her bedside.  She denies any pain, dyspnea or palpitation.  Patient is POD #4 post left femur intramedullary nail.  On the day of discharge patient was hemodynamically stable.  Patient will need to continue PT at SNF.  Hospital Course:  Principal Problem:   Closed displaced spiral fracture of shaft of left femur ([place]) Active Problems:   Diabetes mellitus type 2 in nonobese [facility])   Chronic anticoagulation   Essential hypertension   History of CVA (cerebrovascular accident)  Left femur fracture secondary to mechanical fall POD #4 post left femur intramedullary nail Continue pain management with as needed hydrocodone and Robaxin for muscle spasm Continue MiraLAX daily Continue Dulcolax as needed Continue PT at SNF  Type 2 diabetes Resume home medications Obtain A1c Last A1c was 9.0 on 05/15/2008  History of CVA on chronic anticoagulation On Coumadin Managed by pharmacy Last INR 2.12 on 02/06/2018  Acute blood loss anemia Hemoglobin on presentation 13.0 Hemoglobin 10.4 from 9.3 yesterday No sign of overt bleeding  HTN BP is well controlled C/w metoprolol and amlodipine   Procedures:  POD #5 post left femur intramedullary nail  Consultations:  Orthopedic surgery  Discharge Exam: BP 116/62 (BP Location: Right Arm)   Pulse 76   Temp 98.2 F (36.8 C) (Oral)   Resp 12   Ht 4\' 6"  (1.372 m)   Wt 59 kg (130 lb)   SpO2 98%   BMI 31.34 kg/m  General: 67 year old Caucasian female well-developed well-nourished in no acute distress.  Alert and oriented x3 Cardiovascular: Regular rate and rhythm with no rubs or gallops.  No JVD or thyromegaly present.  Clear to auscultation with no wheezes or rales.   Respiratory: Clear to auscultation with no wheezes or rales.  Good inspiratory effort.  Discharge Instructions You were cared  for by a hospitalist during your hospital stay. If you have any questions about your discharge medications or the care you received while you were in the hospital after you are discharged, you can call the unit and asked to speak with the hospitalist on call if the hospitalist that took care of you is not available. Once you are discharged, your primary care physician will handle any further medical issues. Please note that NO REFILLS for any discharge medications will be authorized once you are discharged, as it is imperative that you return to your primary care physician (or establish a relationship with a primary care physician if you do not have one) for your aftercare needs so that they can reassess your need for medications and monitor your lab values.  Discharge Instructions    Call MD for:   Complete by:  As directed    Please call MD if the symptoms worsen   Diet - low sodium heart healthy   Complete by:  As directed    Diet Carb Modified   Complete by:  As directed    Increase activity slowly   Complete by:  As directed      Allergies as of 02/09/2018   No Known Allergies     Medication List    STOP taking these medications   lisinopril-hydrochlorothiazide 10-12.5 MG tablet Commonly known as:  PRINZIDE,ZESTORETIC     TAKE these medications   alendronate 70 MG tablet Commonly known as:  FOSAMAX Take 70 mg by mouth daily.   amLODipine 10 MG tablet Commonly known as:  NORVASC Take 10 mg by mouth daily.   atorvastatin 20 MG tablet Commonly known as:  LIPITOR Take 20 mg by mouth daily.   docusate sodium 100 MG capsule Commonly known as:  COLACE Take 1 capsule (100 mg total) by mouth 2 (two) times daily. To prevent constipation while taking pain medication.   glimepiride 1 MG tablet Commonly known as:  AMARYL Take 1 mg by mouth daily.   HYDROcodone-acetaminophen 5-325 MG tablet Commonly known as:  NORCO Take 1-2 tablets by mouth every 6 (six) hours as needed for  moderate pain.   ketoconazole 2 % cream Commonly known as:  NIZORAL Apply 1 application topically daily. On her face   metFORMIN 1000 MG tablet Commonly known as:  GLUCOPHAGE Take 1,000 mg by mouth at bedtime.   metoprolol tartrate 100 MG tablet Commonly known as:  LOPRESSOR Take 100 mg by mouth 2 (two) times daily.   polyethylene glycol packet Commonly known as:  MIRALAX / GLYCOLAX Take 17 g by mouth daily.   warfarin 5 MG tablet Commonly known as:  COUMADIN Take 5 mg by mouth on Sunday and Tuesday only, and  2.5 mg remaining days of the week.       No Known Allergies  Contact information for follow-up providers    Renette Butters, MD Follow up in 2 week(s).   Specialty:  Orthopedic Surgery Contact information: [address] [place] [address] [phone]        Wenda Low, MD Follow up in 1 week(s).   Specialty:  Internal Medicine Contact information: [address] [place] [address] [phone]            Contact information for after-discharge care    Destination    [facility] .   Service:  Skilled Nursing Contact information: [address] [place] [phone]                   The results of significant diagnostics from this hospitalization (including imaging, microbiology, ancillary and laboratory) are listed below for reference.    Significant Diagnostic Studies: Dg Chest 1 View  Result Date: 02/01/2018 CLINICAL DATA:  Preop chest for left femoral fracture. EXAM: CHEST  1 VIEW COMPARISON:  None. FINDINGS: Cardiomegaly with ectatic thoracic aorta. Atelectasis noted at each lung base. No pulmonary edema or focal pulmonary consolidation. Remote left-sided rib fractures. No acute osseous abnormality of the bony thorax. IMPRESSION: Cardiomegaly without active pulmonary disease. Electronically Signed   By: Ashley Royalty M.D.   On: 02/01/2018 18:59   Dg  Pelvis 1-2 Views  Result Date: 02/01/2018 CLINICAL DATA:  Left hip pain after slip and fall in bathtub today. EXAM: PELVIS - 1-2 VIEW COMPARISON:  None. FINDINGS: There is no evidence of pelvic fracture or diastasis. Femoral heads are seated within their acetabular components. No pelvic bone lesions are seen. Left L5-S1 assimilation joint. 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 IMPRESSION: 1. No acute fracture of the pelvis. 2. 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 3. L5-S1 left-sided assimilation joint. Electronically Signed   By: Ashley Royalty M.D.   On: 02/01/2018 18:46   Dg Tibia/fibula Left  Result Date: 02/01/2018 CLINICAL DATA:  Left leg pain after slip in the bathroom today. EXAM: LEFT TIBIA AND FIBULA - 2 VIEW COMPARISON:  None. FINDINGS: Partially visualized femoral diaphyseal fracture with lateral displacement of the distal fracture fragment. Tibial arteriosclerosis is noted. Mild joint space narrowing of the femorotibial compartment. No joint dislocation at the knee nor ankle joint. Subtalar joint is maintained as are the midfoot articulations. Soft tissues are unremarkable. IMPRESSION: Partially included femoral diaphyseal fracture with lateral displacement 1 shaft width of the distal fracture fragment. No tibial nor fibular fracture. Electronically Signed   By: Ashley Royalty M.D.   On: 02/01/2018 18:50   Dg C-arm 1-60 Min  Result Date: 02/02/2018 CLINICAL DATA:  LEFT FEMUR INTRAMEDULLARY (IM) NAIL For Left Femur Fracture. Fluoro Time is 1 min 5 secs. EXAM: LEFT FEMUR 2 VIEWS; DG C-ARM 61-120 MIN COMPARISON:  02/01/2018 FINDINGS: The displaced spiral fracture of mid to distal left femoral shaft has been reduced with an intramedullary rod. The fracture is in near anatomic alignment. The orthopedic hardware appears well seated. IMPRESSION: Well-aligned left femur fracture following ORIF.  Electronically Signed   By: Lajean Manes M.D.   On: 02/02/2018 15:34   Dg Femur Min 2 Views Left  Result Date: 02/02/2018 CLINICAL DATA:  LEFT FEMUR INTRAMEDULLARY (IM) NAIL For Left Femur Fracture. Fluoro Time is 1 min 5 secs. EXAM: LEFT FEMUR 2 VIEWS; DG C-ARM 61-120 MIN COMPARISON:  02/01/2018 FINDINGS: The displaced spiral fracture of mid to distal left femoral shaft has been reduced with an intramedullary rod. The fracture is in near anatomic alignment. The orthopedic hardware appears well seated. IMPRESSION: Well-aligned left femur fracture following ORIF. Electronically Signed   By: Lajean Manes M.D.   On: 02/02/2018 15:34   Dg Femur Min 2 Views Left  Result Date: 02/01/2018 CLINICAL DATA:  Left leg pain after fall in bathroom today. EXAM: LEFT FEMUR 2 VIEWS COMPARISON:  None. FINDINGS: There is an acute spiral oblique fracture of the left femoral diaphysis with 1 shaft width lateral and anterior displacement of the distal fracture fragment as well as 90 degrees of external rotation and 4 cm of foreshortening. No intra-articular extension is identified. No joint dislocation at the hip or knee. IMPRESSION: Acute spiral oblique fracture of the left femoral diaphysis with 1 shaft with anterior and lateral displacement of the distal fracture fragment with 90 degrees of external rotation and 4 cm of foreshortening/override. Electronically Signed   By: Ashley Royalty M.D.   On: 02/01/2018 18:53   Dg Femur Port Min 2 Views Left  Result Date: 02/02/2018 CLINICAL DATA:  Left femur fracture ORIF. EXAM: LEFT FEMUR PORTABLE 2 VIEWS COMPARISON:  Left femur x-rays from yesterday. FINDINGS: Near anatomic alignment of the distal femoral diaphyseal spiral fracture status post intramedullary nail placement. No evidence of hardware complication. No new acute abnormality. IMPRESSION: Near anatomic alignment of the distal femur fracture status post ORIF. Electronically Signed   By: Titus Dubin M.D.   On: 02/02/2018  19:59    Microbiology: Recent Results (from the past 240 hour(s))  Surgical pcr screen     Status: None   Collection Time: 02/01/18 10:01 PM  Result Value Ref Range Status   MRSA, PCR NEGATIVE NEGATIVE Final   Staphylococcus aureus NEGATIVE NEGATIVE Final    Comment: (NOTE) The Xpert SA Assay (FDA approved for NASAL specimens in patients 85 years of age and older), is one component of a comprehensive surveillance program. It is not intended to diagnose infection nor to guide or monitor treatment. Performed at [facility], [place] [address], [place], [place] [address]      Labs: Basic Metabolic Panel: Recent Labs  Lab 02/02/18 1624 02/03/18 0617 02/09/18 0611  NA  --  139  --   K  --  3.6  --   CL  --  107  --   CO2  --  24  --   GLUCOSE  --  128*  --   BUN  --  16  --   CREATININE 0.97 0.93 0.78  CALCIUM  --  8.1*  --    Liver Function Tests: No results for input(s): AST, ALT, ALKPHOS, BILITOT, PROT, ALBUMIN in the last 168 hours. No results for input(s): LIPASE, AMYLASE in the last 168 hours. No results for input(s): AMMONIA in the last 168 hours. CBC: Recent Labs  Lab 02/03/18 0617 02/06/18 0659  WBC 7.5 8.8  HGB 9.3* 10.4*  HCT 28.7* 32.0*  MCV 94.4 94.7  PLT 138* 232   Cardiac Enzymes: No results for input(s): CKTOTAL, CKMB, CKMBINDEX, TROPONINI in the last 168 hours. BNP: BNP (last 3 results) No results for input(s): BNP in the last 8760 hours.  ProBNP (last 3 results) No results for input(s): PROBNP in the last 8760 hours.  CBG: Recent Labs  Lab 02/08/18 1130 02/08/18 1643 02/08/18 2119 02/09/18 0642 02/09/18 1114  GLUCAP 143* 173* 153* 160* 127*    Addendum to the discharge summary done by [facility]: Patient has remained stable.  No new changes.  Patient will be discharged to a skilled nursing facility as planned above.  Will follow with PCP and orthopedic surgeon within 1 to 2 weeks of discharge.   Signed:  Bonnell Public, MD [facility] 02/09/2018, 2:21 PM

## 2018-02-09 NOTE — Progress Notes (Signed)
ANTICOAGULATION CONSULT NOTE - Follow Up Consult  Pharmacy Consult:  Coumadin Indication: History of CVA  No Known Allergies  Patient Measurements: Height: 4\' 6"  (137.2 cm) Weight: 130 lb (59 kg) IBW/kg (Calculated) : 31.7  Vital Signs: Temp: 98.7 F (37.1 C) (04/18 0405) Temp Source: Oral (04/18 0405) BP: 136/65 (04/18 0405) Pulse Rate: 77 (04/18 0405)  Labs: Recent Labs    02/07/18 0444 02/08/18 0523 02/09/18 0611  LABPROT 22.5* 24.0* 26.5*  INR 2.00 2.17 2.46  CREATININE  --   --  0.78    Estimated Creatinine Clearance: 45.3 mL/min (by C-G formula based on SCr of 0.78 mg/dL).  Assessment: 54 YOF on Coumadin PTA for history of a CVA (2009).  Patient was admitted 02/01/18 with fall and femur fracture s/p IM nailing on 02/02/18. Coumadin was held 4/10 and 4/11 and resumed on 02/03/18.  INR therapeutic; no bleeding reported.   Goal of Therapy:  INR 2-3 Monitor platelets by anticoagulation protocol: Yes    Plan:  Continue home Coumadin dose:  2.5mg  PO daily except 5mg  on Tues / Sun Daily PT / INR for now   Lenon Kuennen D. Mina Marble, PharmD, BCPS Pager:  [phone] 02/09/2018, 12:01 PM

## 2018-02-09 NOTE — Progress Notes (Signed)
RN called Isaias Cowman and gave report to [place], Therapist, sports. Pt IV has been removed pt comfortable awaiting. PTAR

## 2018-02-09 NOTE — Progress Notes (Signed)
Physical Therapy Treatment Patient Details Name: Rhonda Contreras MRN: [id number] DOB: 02/25/1951 Today's Date: 02/09/2018    History of Present Illness 67 y.o. female admitted on 02/01/18 s/p fall with L femur fx s/p ORIF with post op TDWB status.  Pt with significant PMH of stroke in 2009 with mild residual R sided weakness, renal disorder, lupus, immune difficiency disorder, HTN, DM, and congenital deformity L thumb, osteoporosis ( brittle bones).      PT Comments    Pt very pleasant and eager to participate in therapy. She ambulated 30 feet with RW and min guard assist. Gait distance limited by fatigue and TDWB status LLE. Pt returned to recliner after gait to perform LE exercises. PT to continue per POC.    Follow Up Recommendations  SNF     Equipment Recommendations  Rolling walker with 5" wheels    Recommendations for Other Services       Precautions / Restrictions Precautions Precautions: Fall Restrictions LLE Weight Bearing: Touchdown weight bearing    Mobility  Bed Mobility               General bed mobility comments: Pt in recliner on arrival.    Transfers   Equipment used: Rolling walker (2 wheeled) Transfers: Sit to/from Stand Sit to Stand: Min guard         General transfer comment: verbal cues for hand placement  Ambulation/Gait Ambulation/Gait assistance: Min guard Ambulation Distance (Feet): 30 Feet Assistive device: Rolling walker (2 wheeled) Gait Pattern/deviations: Step-to pattern;Decreased stride length;Antalgic;Trunk flexed Gait velocity: decreased   General Gait Details: verbal cues for posture. Pt able to maintain TDWB LLE during ambulation. Fatigues quickly.   Stairs             Wheelchair Mobility    Modified Rankin (Stroke Patients Only)       Balance                                            Cognition Arousal/Alertness: Awake/alert Behavior During Therapy: WFL for tasks  assessed/performed Overall Cognitive Status: Within Functional Limits for tasks assessed                                        Exercises Total Joint Exercises Ankle Circles/Pumps: AROM;Both;20 reps;Seated Heel Slides: AROM;Left;10 reps;Supine Hip ABduction/ADduction: AROM;Left;10 reps;Supine Long Arc Quad: AROM;Left;10 reps;Seated    General Comments        Pertinent Vitals/Pain Pain Assessment: Faces Faces Pain Scale: Hurts a little bit Pain Location: L LE Pain Descriptors / Indicators: Sore Pain Intervention(s): Monitored during session    Home Living                      Prior Function            PT Goals (current goals can now be found in the care plan section) Acute Rehab PT Goals Patient Stated Goal: to get strong enough to get home to her dog, Rhonda Contreras PT Goal Formulation: With patient Time For Goal Achievement: 02/10/18 Potential to Achieve Goals: Good Progress towards PT goals: Progressing toward goals    Frequency    Min 3X/week      PT Plan Current plan remains appropriate    Co-evaluation  AM-PAC PT "6 Clicks" Daily Activity  Outcome Measure  Difficulty turning over in bed (including adjusting bedclothes, sheets and blankets)?: Unable Difficulty moving from lying on back to sitting on the side of the bed? : Unable Difficulty sitting down on and standing up from a chair with arms (e.g., wheelchair, bedside commode, etc,.)?: A Lot Help needed moving to and from a bed to chair (including a wheelchair)?: A Little Help needed walking in hospital room?: A Little Help needed climbing 3-5 steps with a railing? : Total 6 Click Score: 11    End of Session Equipment Utilized During Treatment: Gait belt Activity Tolerance: Patient tolerated treatment well Patient left: in chair;with call bell/phone within reach Nurse Communication: Mobility status PT Visit Diagnosis: Muscle weakness (generalized)  (M62.81);Difficulty in walking, not elsewhere classified (R26.2);Pain Pain - Right/Left: Left Pain - part of body: Leg     Time: 1120-1136 PT Time Calculation (min) (ACUTE ONLY): 16 min  Charges:  $Therapeutic Exercise: 8-22 mins                    G Codes:       Lorrin Goodell, PT  Office # [phone] Pager [phone]    Lorriane Shire 02/09/2018, 12:39 PM

## 2018-02-09 NOTE — Progress Notes (Addendum)
Occupational Therapy Treatment Patient Details Name: Rhonda Contreras MRN: [id number] DOB: Jul 03, 1951 Today's Date: 02/09/2018    History of present illness 67 y.o. female admitted on 02/01/18 s/p fall with L femur fx s/p ORIF with post op TDWB status.  Pt with significant PMH of stroke in 2009 with mild residual R sided weakness, renal disorder, lupus, immune difficiency disorder, HTN, DM, and congenital deformity L thumb, osteoporosis ( brittle bones).     OT comments  Pt presents supine in bed pleasant and willing to engage in therapy session. Pt demonstrating good progress towards OT goals, demonstrates short distance functional mobility within room using RW, completing with overall MinGuard assist. Pt practicing standing balance and reaching activity in preparation for LB ADL task completion, completing using RW for UE support and with MinA. Pt continues to demonstrate decreased activity tolerance but is very willing to work and progress with therapy. Feel SNF recommendation remains appropriate at this time. Will continue to follow acutely to progress pt towards established OT goals.    Follow Up Recommendations  SNF    Equipment Recommendations  Other (comment)(TBD in next venue )          Precautions / Restrictions Precautions Precautions: Fall Restrictions Weight Bearing Restrictions: Yes LLE Weight Bearing: Touchdown weight bearing       Mobility Bed Mobility Overal bed mobility: Modified Independent             General bed mobility comments: Pt in recliner on arrival.    Transfers Overall transfer level: Needs assistance Equipment used: Rolling walker (2 wheeled) Transfers: Sit to/from Stand Sit to Stand: Min guard         General transfer comment: verbal cues for hand placement    Balance Overall balance assessment: Needs assistance Sitting-balance support: Feet supported;No upper extremity supported Sitting balance-Leahy Scale: Good       Standing  balance-Leahy Scale: Poor                             ADL either performed or assessed with clinical judgement   ADL Overall ADL's : Needs assistance/impaired                                     Functional mobility during ADLs: Min guard;Rolling walker General ADL Comments: Pt reportsalready completing toileting and grooming ADLS prior to therapist's arrival; completes room level functional mobility using RW, ambulating to windown and back to chair; pt practicing standing balance tasks reaching and alternating use of single UE on RW in preparation for maintaining balance during LB and toileting ADLs, able to maintain TDWB throughout and completing with minguard-MinA. Pt engaged in seated UB/LB exercise                       Cognition Arousal/Alertness: Awake/alert Behavior During Therapy: WFL for tasks assessed/performed Overall Cognitive Status: Within Functional Limits for tasks assessed                                          Exercises    General Exercises - Upper Extremity Shoulder Flexion: AROM;Strengthening;10 reps;Both;Seated Shoulder Horizontal ABduction: AROM;Strengthening;10 reps;Both;Seated General Exercises - Lower Extremity Ankle Circles/Pumps: AROM;Both;15 reps;Seated Hip Flexion/Marching: AROM;10 reps;Both;Seated  Pertinent Vitals/ Pain       Pain Assessment: Faces Faces Pain Scale: Hurts a little bit Pain Location: L LE Pain Descriptors / Indicators: Sore Pain Intervention(s): Monitored during session                                                          Frequency  Min 2X/week        Progress Toward Goals  OT Goals(current goals can now be found in the care plan section)  Progress towards OT goals: Progressing toward goals  Acute Rehab OT Goals Patient Stated Goal: to get strong enough to get home to her dog, Rhonda Contreras OT Goal Formulation: With  patient Time For Goal Achievement: 02/20/18 Potential to Achieve Goals: Good  Plan Discharge plan remains appropriate    AM-PAC PT "6 Clicks" Daily Activity     Outcome Measure   Help from another person eating meals?: None Help from another person taking care of personal grooming?: A Little Help from another person toileting, which includes using toliet, bedpan, or urinal?: A Little Help from another person bathing (including washing, rinsing, drying)?: A Little Help from another person to put on and taking off regular upper body clothing?: A Little Help from another person to put on and taking off regular lower body clothing?: A Lot 6 Click Score: 18    End of Session Equipment Utilized During Treatment: Rolling walker;Gait belt  OT Visit Diagnosis: Unsteadiness on feet (R26.81);Muscle weakness (generalized) (M62.81)   Activity Tolerance Patient tolerated treatment well   Patient Left in chair;with call bell/phone within reach   Nurse Communication Mobility status        Time: 2025-4270 OT Time Calculation (min): 15 min  Charges: OT General Charges $OT Visit: 1 Visit OT Treatments $Therapeutic Activity: 8-22 mins  Lou Cal, OT Pager [phone] 02/09/2018    Raymondo Band 02/09/2018, 1:34 PM

## 2018-02-10 DIAGNOSIS — M810 Age-related osteoporosis without current pathological fracture: Secondary | ICD-10-CM | POA: Diagnosis not present

## 2018-02-10 DIAGNOSIS — S72342A Displaced spiral fracture of shaft of left femur, initial encounter for closed fracture: Secondary | ICD-10-CM | POA: Diagnosis not present

## 2018-02-10 DIAGNOSIS — E119 Type 2 diabetes mellitus without complications: Secondary | ICD-10-CM | POA: Diagnosis not present

## 2018-02-10 DIAGNOSIS — I10 Essential (primary) hypertension: Secondary | ICD-10-CM | POA: Diagnosis not present

## 2018-02-13 DIAGNOSIS — Z9889 Other specified postprocedural states: Secondary | ICD-10-CM | POA: Diagnosis not present

## 2018-02-13 DIAGNOSIS — R2689 Other abnormalities of gait and mobility: Secondary | ICD-10-CM | POA: Diagnosis not present

## 2018-02-13 DIAGNOSIS — Z9181 History of falling: Secondary | ICD-10-CM | POA: Diagnosis not present

## 2018-02-13 DIAGNOSIS — M79652 Pain in left thigh: Secondary | ICD-10-CM | POA: Diagnosis not present

## 2018-02-13 DIAGNOSIS — S72342A Displaced spiral fracture of shaft of left femur, initial encounter for closed fracture: Secondary | ICD-10-CM | POA: Diagnosis not present

## 2018-02-13 DIAGNOSIS — Z8673 Personal history of transient ischemic attack (TIA), and cerebral infarction without residual deficits: Secondary | ICD-10-CM | POA: Diagnosis not present

## 2018-02-13 DIAGNOSIS — Z7901 Long term (current) use of anticoagulants: Secondary | ICD-10-CM | POA: Diagnosis not present

## 2018-02-13 DIAGNOSIS — R197 Diarrhea, unspecified: Secondary | ICD-10-CM | POA: Diagnosis not present

## 2018-02-15 DIAGNOSIS — R2689 Other abnormalities of gait and mobility: Secondary | ICD-10-CM | POA: Diagnosis not present

## 2018-02-15 DIAGNOSIS — Z9181 History of falling: Secondary | ICD-10-CM | POA: Diagnosis not present

## 2018-02-15 DIAGNOSIS — R197 Diarrhea, unspecified: Secondary | ICD-10-CM | POA: Diagnosis not present

## 2018-02-15 DIAGNOSIS — M79652 Pain in left thigh: Secondary | ICD-10-CM | POA: Diagnosis not present

## 2018-02-17 DIAGNOSIS — Z9181 History of falling: Secondary | ICD-10-CM | POA: Diagnosis not present

## 2018-02-17 DIAGNOSIS — R2689 Other abnormalities of gait and mobility: Secondary | ICD-10-CM | POA: Diagnosis not present

## 2018-02-17 DIAGNOSIS — M79652 Pain in left thigh: Secondary | ICD-10-CM | POA: Diagnosis not present

## 2018-02-22 DIAGNOSIS — Z9889 Other specified postprocedural states: Secondary | ICD-10-CM | POA: Diagnosis not present

## 2018-02-22 DIAGNOSIS — M79652 Pain in left thigh: Secondary | ICD-10-CM | POA: Diagnosis not present

## 2018-02-22 DIAGNOSIS — Z9181 History of falling: Secondary | ICD-10-CM | POA: Diagnosis not present

## 2018-02-22 DIAGNOSIS — 419620001 Death: Secondary | SNOMED CT | POA: Diagnosis not present

## 2018-02-22 DIAGNOSIS — R2689 Other abnormalities of gait and mobility: Secondary | ICD-10-CM | POA: Diagnosis not present

## 2018-02-22 DIAGNOSIS — S72342D Displaced spiral fracture of shaft of left femur, subsequent encounter for closed fracture with routine healing: Secondary | ICD-10-CM | POA: Diagnosis not present

## 2018-02-22 DIAGNOSIS — S72452D Displaced supracondylar fracture without intracondylar extension of lower end of left femur, subsequent encounter for closed fracture with routine healing: Secondary | ICD-10-CM | POA: Diagnosis not present

## 2018-02-22 DEATH — deceased

## 2018-02-26 NOTE — Anesthesia Postprocedure Evaluation (Signed)
Anesthesia Post Note  Patient: Rhonda Contreras  Procedure(s) Performed: LEFT FEMUR INTRAMEDULLARY (IM) NAIL (Left Leg Upper)     Patient location during evaluation: PACU Anesthesia Type: General Level of consciousness: awake and sedated Pain management: pain level controlled Vital Signs Assessment: post-procedure vital signs reviewed and stable Respiratory status: spontaneous breathing, nonlabored ventilation, respiratory function stable and patient connected to nasal cannula oxygen Cardiovascular status: blood pressure returned to baseline and stable Postop Assessment: no apparent nausea or vomiting Anesthetic complications: no    Last Vitals:  Vitals:   02/09/18 0405 02/09/18 1233  BP: 136/65 116/62  Pulse: 77 76  Resp:  12  Temp: 37.1 C 36.8 C  SpO2: 95% 98%    Last Pain:  Vitals:   02/09/18 1233  TempSrc: Oral  PainSc:                  Cuma Polyakov,JAMES TERRILL

## 2018-02-27 DIAGNOSIS — E119 Type 2 diabetes mellitus without complications: Secondary | ICD-10-CM | POA: Diagnosis not present

## 2018-02-27 DIAGNOSIS — I10 Essential (primary) hypertension: Secondary | ICD-10-CM | POA: Diagnosis not present

## 2018-02-27 DIAGNOSIS — M810 Age-related osteoporosis without current pathological fracture: Secondary | ICD-10-CM | POA: Diagnosis not present

## 2018-02-27 DIAGNOSIS — S72342D Displaced spiral fracture of shaft of left femur, subsequent encounter for closed fracture with routine healing: Secondary | ICD-10-CM | POA: Diagnosis not present

## 2018-03-03 DIAGNOSIS — S7290XA Unspecified fracture of unspecified femur, initial encounter for closed fracture: Secondary | ICD-10-CM | POA: Diagnosis not present

## 2018-03-03 DIAGNOSIS — M810 Age-related osteoporosis without current pathological fracture: Secondary | ICD-10-CM | POA: Diagnosis not present

## 2018-03-03 DIAGNOSIS — Z7901 Long term (current) use of anticoagulants: Secondary | ICD-10-CM | POA: Diagnosis not present

## 2018-03-11 DIAGNOSIS — R69 Illness, unspecified: Secondary | ICD-10-CM | POA: Diagnosis not present

## 2018-03-22 DIAGNOSIS — S72452D Displaced supracondylar fracture without intracondylar extension of lower end of left femur, subsequent encounter for closed fracture with routine healing: Secondary | ICD-10-CM | POA: Diagnosis not present

## 2018-03-22 DIAGNOSIS — Z7901 Long term (current) use of anticoagulants: Secondary | ICD-10-CM | POA: Diagnosis not present

## 2018-03-29 DIAGNOSIS — Z7901 Long term (current) use of anticoagulants: Secondary | ICD-10-CM | POA: Diagnosis not present

## 2018-04-12 DIAGNOSIS — Z85828 Personal history of other malignant neoplasm of skin: Secondary | ICD-10-CM | POA: Diagnosis not present

## 2018-04-12 DIAGNOSIS — C44311 Basal cell carcinoma of skin of nose: Secondary | ICD-10-CM | POA: Diagnosis not present

## 2018-04-12 DIAGNOSIS — Z7901 Long term (current) use of anticoagulants: Secondary | ICD-10-CM | POA: Diagnosis not present

## 2018-04-22 DIAGNOSIS — R69 Illness, unspecified: Secondary | ICD-10-CM | POA: Diagnosis not present

## 2018-05-01 DIAGNOSIS — Z7901 Long term (current) use of anticoagulants: Secondary | ICD-10-CM | POA: Diagnosis not present

## 2018-05-09 DIAGNOSIS — R69 Illness, unspecified: Secondary | ICD-10-CM | POA: Diagnosis not present

## 2018-05-09 DIAGNOSIS — Z7901 Long term (current) use of anticoagulants: Secondary | ICD-10-CM | POA: Diagnosis not present

## 2018-05-29 DIAGNOSIS — Z7901 Long term (current) use of anticoagulants: Secondary | ICD-10-CM | POA: Diagnosis not present

## 2018-06-13 DIAGNOSIS — Z7901 Long term (current) use of anticoagulants: Secondary | ICD-10-CM | POA: Diagnosis not present

## 2018-07-11 DIAGNOSIS — E782 Mixed hyperlipidemia: Secondary | ICD-10-CM | POA: Diagnosis not present

## 2018-07-11 DIAGNOSIS — Z7901 Long term (current) use of anticoagulants: Secondary | ICD-10-CM | POA: Diagnosis not present

## 2018-07-11 DIAGNOSIS — N183 Chronic kidney disease, stage 3 (moderate): Secondary | ICD-10-CM | POA: Diagnosis not present

## 2018-07-11 DIAGNOSIS — E1122 Type 2 diabetes mellitus with diabetic chronic kidney disease: Secondary | ICD-10-CM | POA: Diagnosis not present

## 2018-07-11 DIAGNOSIS — I10 Essential (primary) hypertension: Secondary | ICD-10-CM | POA: Diagnosis not present

## 2018-07-11 DIAGNOSIS — M810 Age-related osteoporosis without current pathological fracture: Secondary | ICD-10-CM | POA: Diagnosis not present

## 2018-07-11 DIAGNOSIS — G8190 Hemiplegia, unspecified affecting unspecified side: Secondary | ICD-10-CM | POA: Diagnosis not present

## 2018-07-11 DIAGNOSIS — I639 Cerebral infarction, unspecified: Secondary | ICD-10-CM | POA: Diagnosis not present

## 2018-07-18 DIAGNOSIS — Z7901 Long term (current) use of anticoagulants: Secondary | ICD-10-CM | POA: Diagnosis not present

## 2018-08-08 DIAGNOSIS — Z23 Encounter for immunization: Secondary | ICD-10-CM | POA: Diagnosis not present

## 2018-08-08 DIAGNOSIS — Z7901 Long term (current) use of anticoagulants: Secondary | ICD-10-CM | POA: Diagnosis not present

## 2018-08-22 DIAGNOSIS — R69 Illness, unspecified: Secondary | ICD-10-CM | POA: Diagnosis not present

## 2018-09-06 DIAGNOSIS — Z7901 Long term (current) use of anticoagulants: Secondary | ICD-10-CM | POA: Diagnosis not present

## 2018-10-03 DIAGNOSIS — H5212 Myopia, left eye: Secondary | ICD-10-CM | POA: Diagnosis not present

## 2018-10-03 DIAGNOSIS — H524 Presbyopia: Secondary | ICD-10-CM | POA: Diagnosis not present

## 2018-10-03 DIAGNOSIS — H17822 Peripheral opacity of cornea, left eye: Secondary | ICD-10-CM | POA: Diagnosis not present

## 2018-10-03 DIAGNOSIS — Z7901 Long term (current) use of anticoagulants: Secondary | ICD-10-CM | POA: Diagnosis not present

## 2018-10-03 DIAGNOSIS — E119 Type 2 diabetes mellitus without complications: Secondary | ICD-10-CM | POA: Diagnosis not present

## 2018-10-03 DIAGNOSIS — H5201 Hypermetropia, right eye: Secondary | ICD-10-CM | POA: Diagnosis not present

## 2018-10-30 DIAGNOSIS — Z7901 Long term (current) use of anticoagulants: Secondary | ICD-10-CM | POA: Diagnosis not present

## 2018-11-27 DIAGNOSIS — Z7901 Long term (current) use of anticoagulants: Secondary | ICD-10-CM | POA: Diagnosis not present

## 2018-12-27 DIAGNOSIS — R69 Illness, unspecified: Secondary | ICD-10-CM | POA: Diagnosis not present

## 2019-01-16 DIAGNOSIS — Z0000 Encounter for general adult medical examination without abnormal findings: Secondary | ICD-10-CM | POA: Diagnosis not present

## 2019-01-16 DIAGNOSIS — Z1211 Encounter for screening for malignant neoplasm of colon: Secondary | ICD-10-CM | POA: Diagnosis not present

## 2019-01-16 DIAGNOSIS — E782 Mixed hyperlipidemia: Secondary | ICD-10-CM | POA: Diagnosis not present

## 2019-01-16 DIAGNOSIS — Z1389 Encounter for screening for other disorder: Secondary | ICD-10-CM | POA: Diagnosis not present

## 2019-01-16 DIAGNOSIS — I639 Cerebral infarction, unspecified: Secondary | ICD-10-CM | POA: Diagnosis not present

## 2019-01-16 DIAGNOSIS — I10 Essential (primary) hypertension: Secondary | ICD-10-CM | POA: Diagnosis not present

## 2019-01-16 DIAGNOSIS — Z7901 Long term (current) use of anticoagulants: Secondary | ICD-10-CM | POA: Diagnosis not present

## 2019-01-16 DIAGNOSIS — G8190 Hemiplegia, unspecified affecting unspecified side: Secondary | ICD-10-CM | POA: Diagnosis not present

## 2019-01-16 DIAGNOSIS — L719 Rosacea, unspecified: Secondary | ICD-10-CM | POA: Diagnosis not present

## 2019-01-16 DIAGNOSIS — N183 Chronic kidney disease, stage 3 (moderate): Secondary | ICD-10-CM | POA: Diagnosis not present

## 2019-01-16 DIAGNOSIS — E1122 Type 2 diabetes mellitus with diabetic chronic kidney disease: Secondary | ICD-10-CM | POA: Diagnosis not present

## 2019-01-16 DIAGNOSIS — M810 Age-related osteoporosis without current pathological fracture: Secondary | ICD-10-CM | POA: Diagnosis not present

## 2019-01-16 DIAGNOSIS — R760 Raised antibody titer: Secondary | ICD-10-CM | POA: Diagnosis not present

## 2019-02-13 DIAGNOSIS — Z7901 Long term (current) use of anticoagulants: Secondary | ICD-10-CM | POA: Diagnosis not present

## 2019-03-14 DIAGNOSIS — Z7901 Long term (current) use of anticoagulants: Secondary | ICD-10-CM | POA: Diagnosis not present

## 2019-04-11 DIAGNOSIS — Z7901 Long term (current) use of anticoagulants: Secondary | ICD-10-CM | POA: Diagnosis not present

## 2019-04-18 DIAGNOSIS — Z7901 Long term (current) use of anticoagulants: Secondary | ICD-10-CM | POA: Diagnosis not present

## 2019-05-16 DIAGNOSIS — Z7901 Long term (current) use of anticoagulants: Secondary | ICD-10-CM | POA: Diagnosis not present

## 2019-06-13 DIAGNOSIS — Z7901 Long term (current) use of anticoagulants: Secondary | ICD-10-CM | POA: Diagnosis not present

## 2019-06-22 DIAGNOSIS — L821 Other seborrheic keratosis: Secondary | ICD-10-CM | POA: Diagnosis not present

## 2019-06-22 DIAGNOSIS — L817 Pigmented purpuric dermatosis: Secondary | ICD-10-CM | POA: Diagnosis not present

## 2019-06-22 DIAGNOSIS — L570 Actinic keratosis: Secondary | ICD-10-CM | POA: Diagnosis not present

## 2019-06-22 DIAGNOSIS — L929 Granulomatous disorder of the skin and subcutaneous tissue, unspecified: Secondary | ICD-10-CM | POA: Diagnosis not present

## 2019-06-22 DIAGNOSIS — D485 Neoplasm of uncertain behavior of skin: Secondary | ICD-10-CM | POA: Diagnosis not present

## 2019-06-22 DIAGNOSIS — D1801 Hemangioma of skin and subcutaneous tissue: Secondary | ICD-10-CM | POA: Diagnosis not present

## 2019-06-22 DIAGNOSIS — L718 Other rosacea: Secondary | ICD-10-CM | POA: Diagnosis not present

## 2019-06-22 DIAGNOSIS — D2271 Melanocytic nevi of right lower limb, including hip: Secondary | ICD-10-CM | POA: Diagnosis not present

## 2019-06-22 DIAGNOSIS — Z85828 Personal history of other malignant neoplasm of skin: Secondary | ICD-10-CM | POA: Diagnosis not present

## 2019-06-22 DIAGNOSIS — D225 Melanocytic nevi of trunk: Secondary | ICD-10-CM | POA: Diagnosis not present

## 2019-07-11 DIAGNOSIS — Z7901 Long term (current) use of anticoagulants: Secondary | ICD-10-CM | POA: Diagnosis not present

## 2019-07-13 DIAGNOSIS — Z7901 Long term (current) use of anticoagulants: Secondary | ICD-10-CM | POA: Diagnosis not present

## 2019-07-18 DIAGNOSIS — I10 Essential (primary) hypertension: Secondary | ICD-10-CM | POA: Diagnosis not present

## 2019-07-18 DIAGNOSIS — G8190 Hemiplegia, unspecified affecting unspecified side: Secondary | ICD-10-CM | POA: Diagnosis not present

## 2019-07-18 DIAGNOSIS — Z7984 Long term (current) use of oral hypoglycemic drugs: Secondary | ICD-10-CM | POA: Diagnosis not present

## 2019-07-18 DIAGNOSIS — Z1231 Encounter for screening mammogram for malignant neoplasm of breast: Secondary | ICD-10-CM | POA: Diagnosis not present

## 2019-07-18 DIAGNOSIS — Z7901 Long term (current) use of anticoagulants: Secondary | ICD-10-CM | POA: Diagnosis not present

## 2019-07-18 DIAGNOSIS — M810 Age-related osteoporosis without current pathological fracture: Secondary | ICD-10-CM | POA: Diagnosis not present

## 2019-07-18 DIAGNOSIS — R760 Raised antibody titer: Secondary | ICD-10-CM | POA: Diagnosis not present

## 2019-07-18 DIAGNOSIS — I639 Cerebral infarction, unspecified: Secondary | ICD-10-CM | POA: Diagnosis not present

## 2019-07-18 DIAGNOSIS — E782 Mixed hyperlipidemia: Secondary | ICD-10-CM | POA: Diagnosis not present

## 2019-07-18 DIAGNOSIS — E1122 Type 2 diabetes mellitus with diabetic chronic kidney disease: Secondary | ICD-10-CM | POA: Diagnosis not present

## 2019-07-18 DIAGNOSIS — N183 Chronic kidney disease, stage 3 (moderate): Secondary | ICD-10-CM | POA: Diagnosis not present

## 2019-07-23 DIAGNOSIS — R69 Illness, unspecified: Secondary | ICD-10-CM | POA: Diagnosis not present

## 2019-08-06 DIAGNOSIS — Z7901 Long term (current) use of anticoagulants: Secondary | ICD-10-CM | POA: Diagnosis not present

## 2019-08-14 DIAGNOSIS — R69 Illness, unspecified: Secondary | ICD-10-CM | POA: Diagnosis not present

## 2019-09-10 DIAGNOSIS — Z7901 Long term (current) use of anticoagulants: Secondary | ICD-10-CM | POA: Diagnosis not present

## 2019-10-05 DIAGNOSIS — H5212 Myopia, left eye: Secondary | ICD-10-CM | POA: Diagnosis not present

## 2019-10-05 DIAGNOSIS — H524 Presbyopia: Secondary | ICD-10-CM | POA: Diagnosis not present

## 2019-10-05 DIAGNOSIS — H5201 Hypermetropia, right eye: Secondary | ICD-10-CM | POA: Diagnosis not present

## 2019-10-05 DIAGNOSIS — E119 Type 2 diabetes mellitus without complications: Secondary | ICD-10-CM | POA: Diagnosis not present

## 2019-10-09 DIAGNOSIS — Z7901 Long term (current) use of anticoagulants: Secondary | ICD-10-CM | POA: Diagnosis not present

## 2019-10-30 DIAGNOSIS — Z7901 Long term (current) use of anticoagulants: Secondary | ICD-10-CM | POA: Diagnosis not present

## 2019-11-11 DIAGNOSIS — R69 Illness, unspecified: Secondary | ICD-10-CM | POA: Diagnosis not present

## 2019-11-27 DIAGNOSIS — Z7901 Long term (current) use of anticoagulants: Secondary | ICD-10-CM | POA: Diagnosis not present

## 2019-11-27 DIAGNOSIS — R69 Illness, unspecified: Secondary | ICD-10-CM | POA: Diagnosis not present

## 2019-12-25 DIAGNOSIS — Z7901 Long term (current) use of anticoagulants: Secondary | ICD-10-CM | POA: Diagnosis not present

## 2020-01-21 DIAGNOSIS — E1122 Type 2 diabetes mellitus with diabetic chronic kidney disease: Secondary | ICD-10-CM | POA: Diagnosis not present

## 2020-01-21 DIAGNOSIS — Z1211 Encounter for screening for malignant neoplasm of colon: Secondary | ICD-10-CM | POA: Diagnosis not present

## 2020-01-21 DIAGNOSIS — E782 Mixed hyperlipidemia: Secondary | ICD-10-CM | POA: Diagnosis not present

## 2020-01-21 DIAGNOSIS — M810 Age-related osteoporosis without current pathological fracture: Secondary | ICD-10-CM | POA: Diagnosis not present

## 2020-01-21 DIAGNOSIS — Z7901 Long term (current) use of anticoagulants: Secondary | ICD-10-CM | POA: Diagnosis not present

## 2020-01-21 DIAGNOSIS — I69359 Hemiplegia and hemiparesis following cerebral infarction affecting unspecified side: Secondary | ICD-10-CM | POA: Diagnosis not present

## 2020-01-21 DIAGNOSIS — L719 Rosacea, unspecified: Secondary | ICD-10-CM | POA: Diagnosis not present

## 2020-01-21 DIAGNOSIS — I10 Essential (primary) hypertension: Secondary | ICD-10-CM | POA: Diagnosis not present

## 2020-01-21 DIAGNOSIS — Z0000 Encounter for general adult medical examination without abnormal findings: Secondary | ICD-10-CM | POA: Diagnosis not present

## 2020-01-21 DIAGNOSIS — R760 Raised antibody titer: Secondary | ICD-10-CM | POA: Diagnosis not present

## 2020-01-21 DIAGNOSIS — N183 Chronic kidney disease, stage 3 unspecified: Secondary | ICD-10-CM | POA: Diagnosis not present

## 2020-01-21 DIAGNOSIS — Z1389 Encounter for screening for other disorder: Secondary | ICD-10-CM | POA: Diagnosis not present

## 2020-02-06 DIAGNOSIS — R69 Illness, unspecified: Secondary | ICD-10-CM | POA: Diagnosis not present

## 2020-02-19 DIAGNOSIS — Z7901 Long term (current) use of anticoagulants: Secondary | ICD-10-CM | POA: Diagnosis not present

## 2020-02-19 DIAGNOSIS — Z1211 Encounter for screening for malignant neoplasm of colon: Secondary | ICD-10-CM | POA: Diagnosis not present

## 2020-02-26 DIAGNOSIS — R69 Illness, unspecified: Secondary | ICD-10-CM | POA: Diagnosis not present

## 2020-03-18 DIAGNOSIS — Z7901 Long term (current) use of anticoagulants: Secondary | ICD-10-CM | POA: Diagnosis not present

## 2020-03-22 IMAGING — CR DG CHEST 1V
1 series · 1 of 1 positions shown · non-contrast
Comparison: None.

CLINICAL DATA: Preop chest for left femoral fracture.

EXAM:
CHEST  1 VIEW

[chest ap]
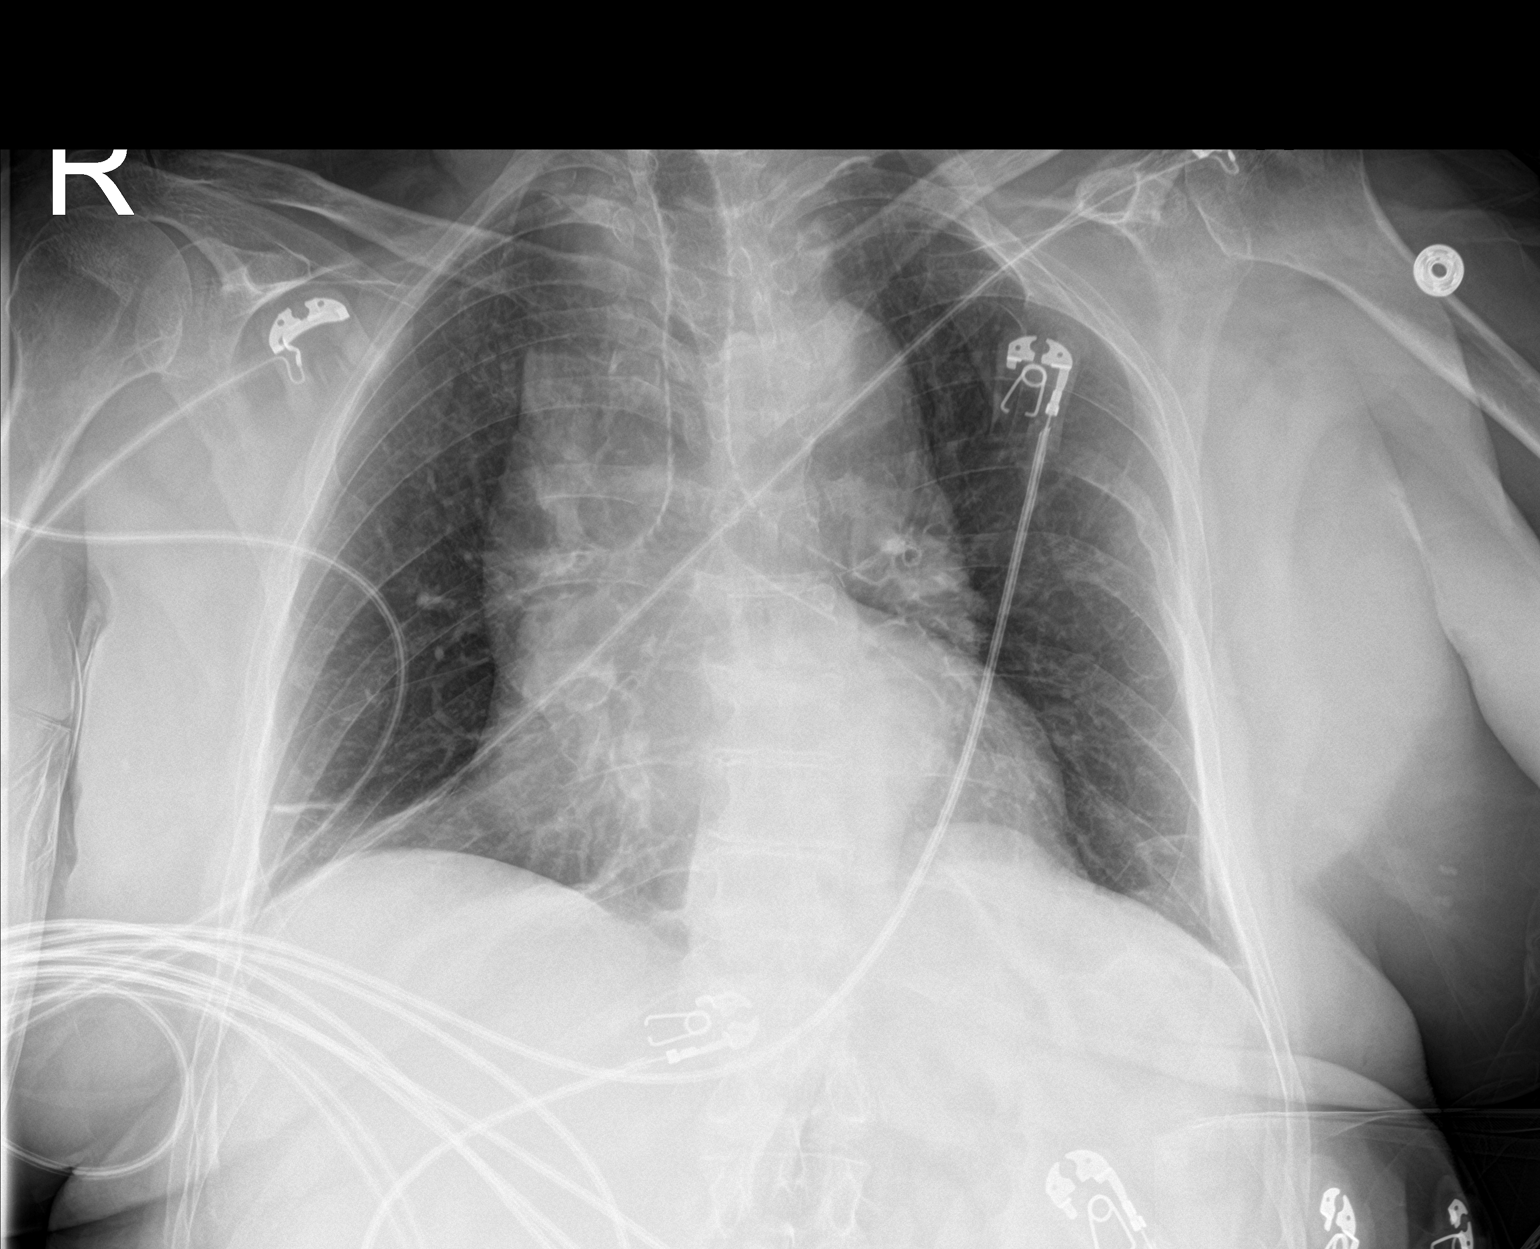

[1 of 1 positions shown; findings below may reference images not displayed]

FINDINGS: Cardiomegaly with ectatic thoracic aorta. Atelectasis noted at each
lung base. No pulmonary edema or focal pulmonary consolidation.
Remote left-sided rib fractures. No acute osseous abnormality of the
bony thorax.
IMPRESSION: Cardiomegaly without active pulmonary disease.

## 2020-03-23 IMAGING — CR DG FEMUR 2+V PORT*L*
4 series · 4 of 4 positions shown · non-contrast
Comparison: Left femur x-rays from yesterday.

CLINICAL DATA: Left femur fracture ORIF.

EXAM:
LEFT FEMUR PORTABLE 2 VIEWS

[AP (1 of 2)]
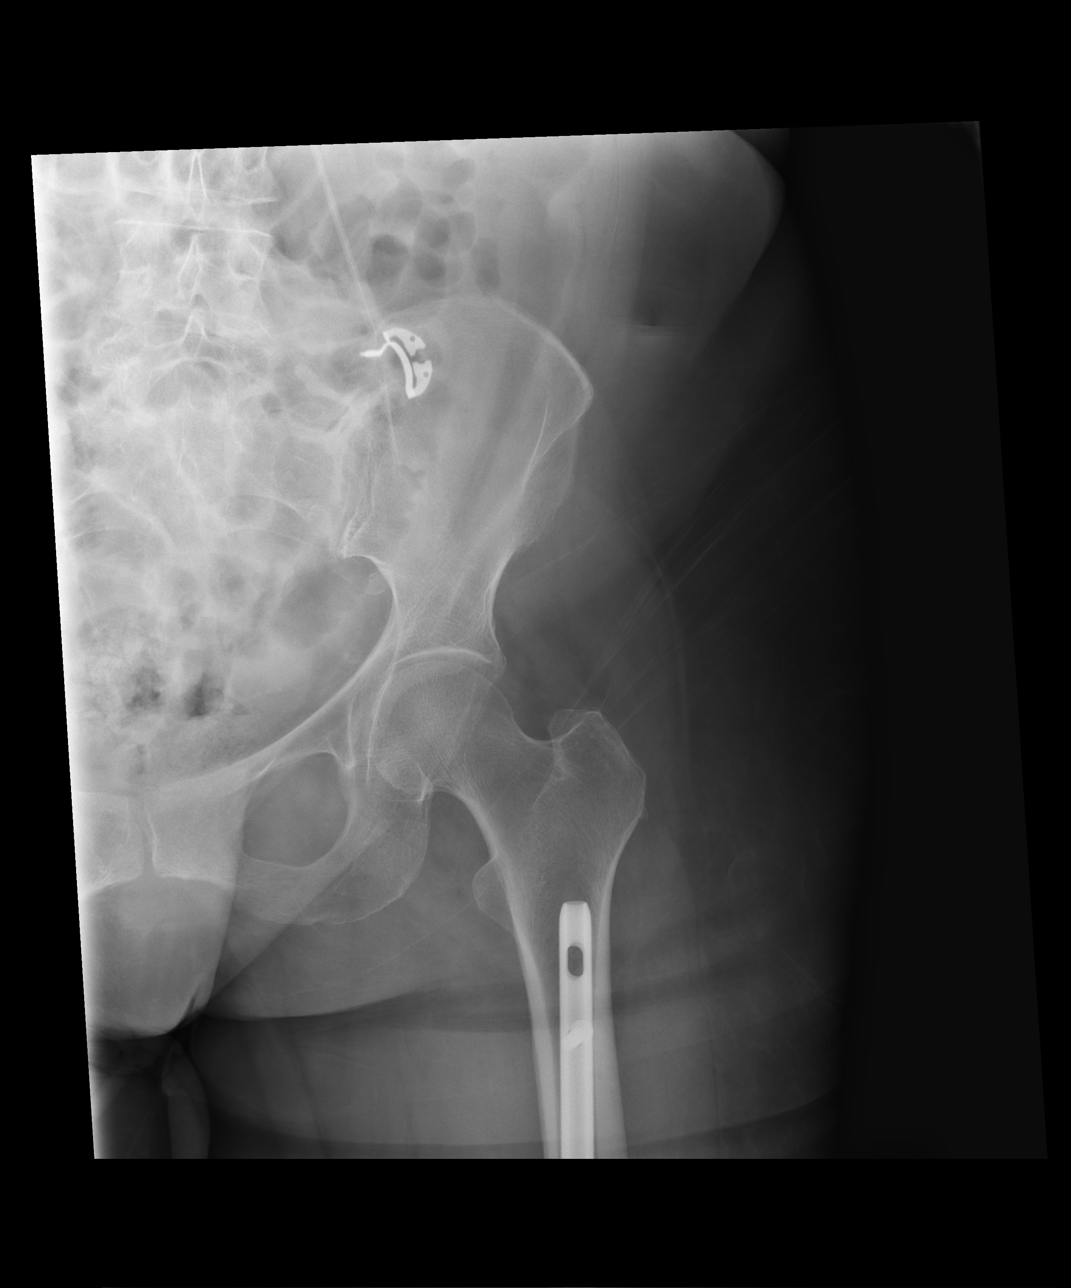

[AP (2 of 2)]
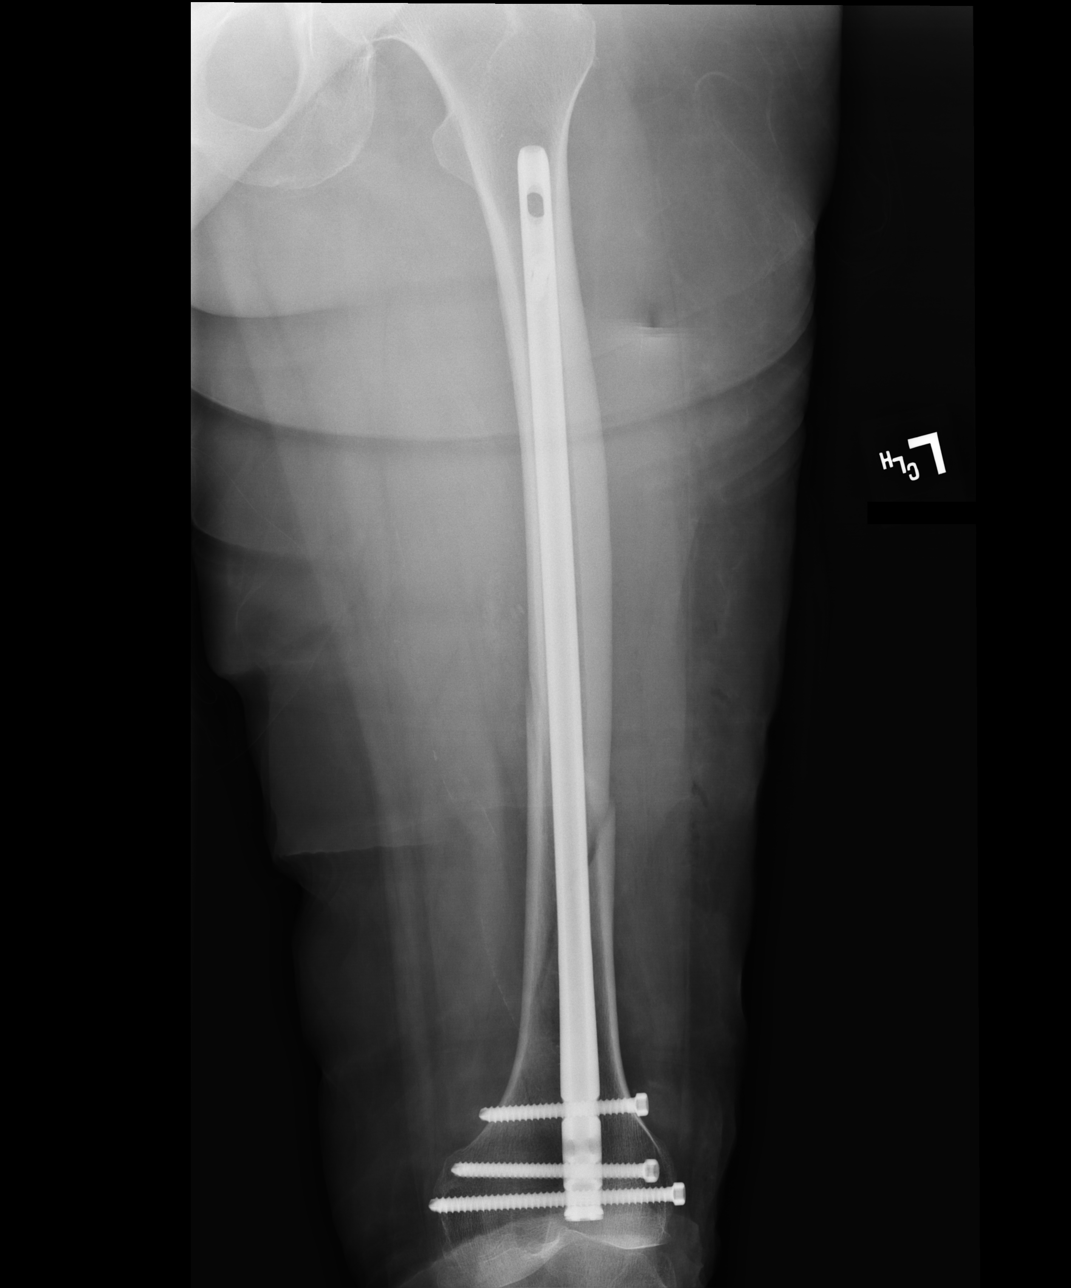

[xtable lateral (1 of 2)]
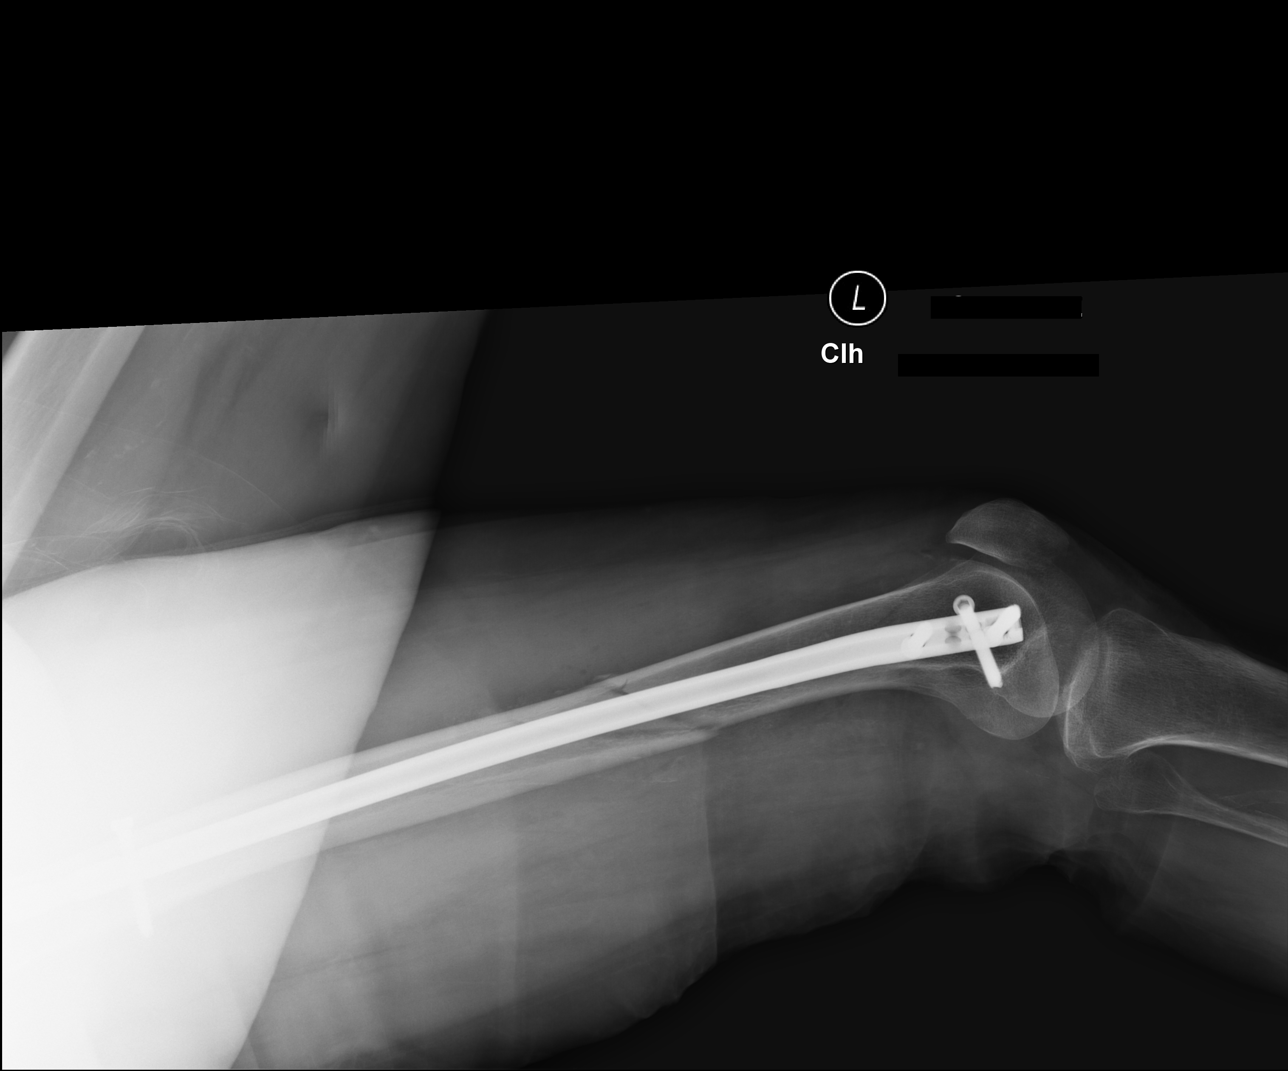

[xtable lateral (2 of 2)]
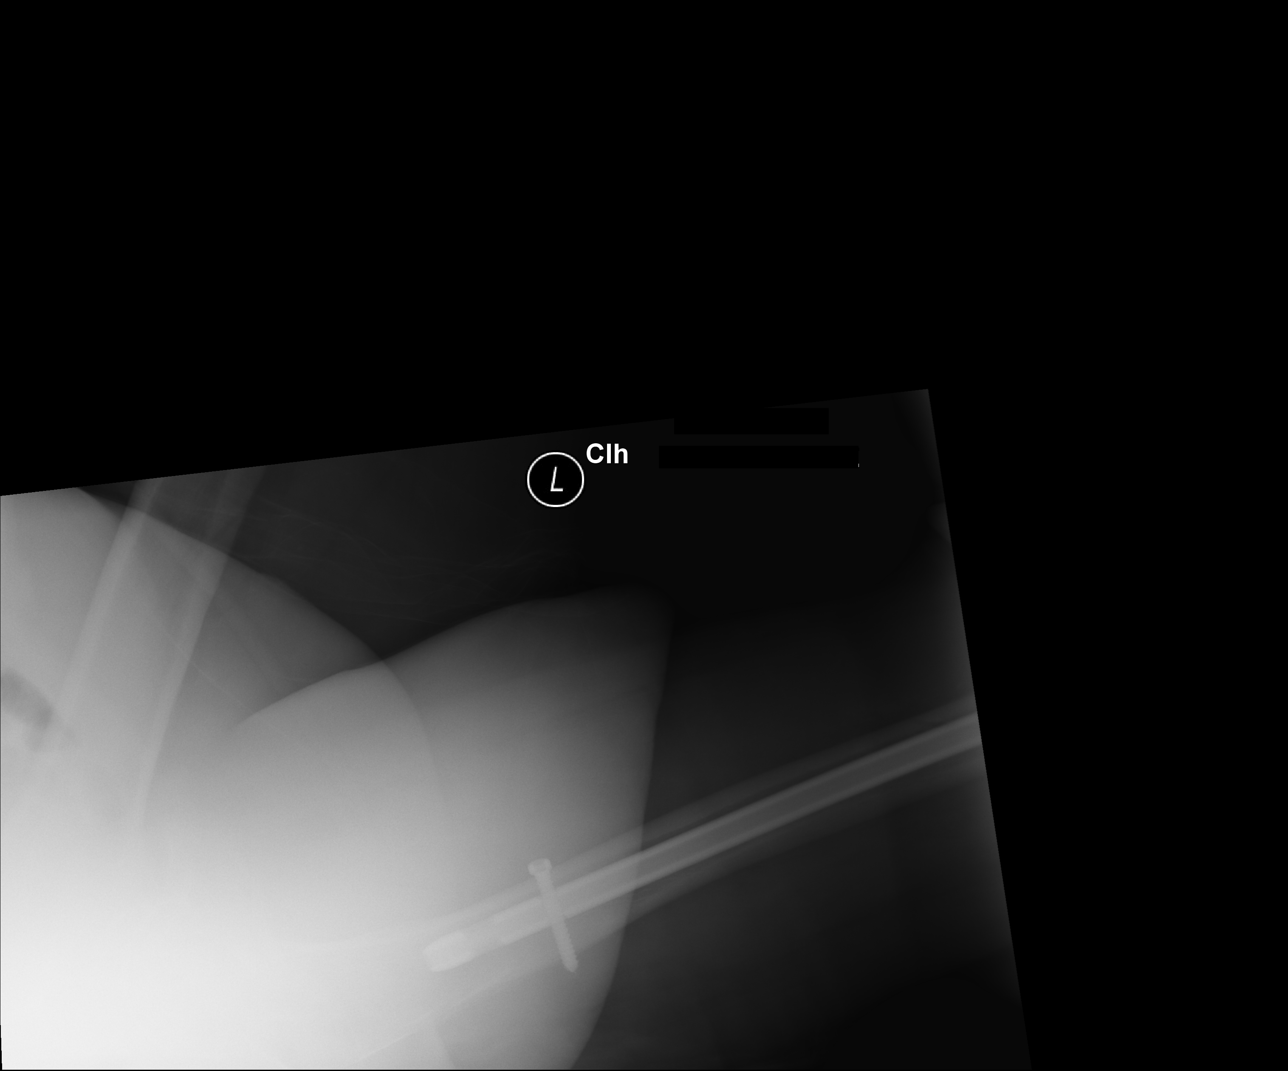

[4 of 4 positions shown; findings below may reference images not displayed]

FINDINGS: Near anatomic alignment of the distal femoral diaphyseal spiral
fracture status post intramedullary nail placement. No evidence of
hardware complication. No new acute abnormality.
IMPRESSION: Near anatomic alignment of the distal femur fracture status post
ORIF.

## 2020-03-23 IMAGING — RF DG C-ARM 61-120 MIN
1 series · 6 of 6 positions shown · non-contrast
Comparison: 02/01/2018

CLINICAL DATA: LEFT FEMUR INTRAMEDULLARY (IM) NAIL For Left Femur
Fracture.

Fluoro Time is 1 min 5 secs.
EXAM:
LEFT FEMUR 2 VIEWS; DG C-ARM 61-120 MIN

[Series 1: run · 6 of 6 slices shown]
[im 1/6]
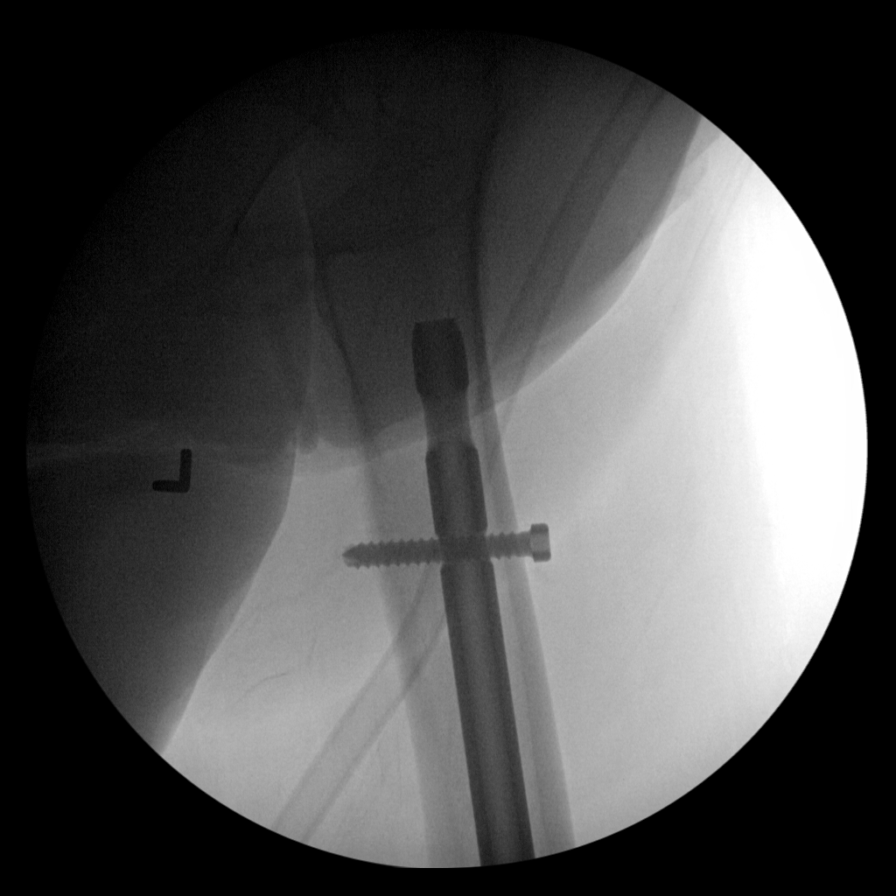
[im 2/6]
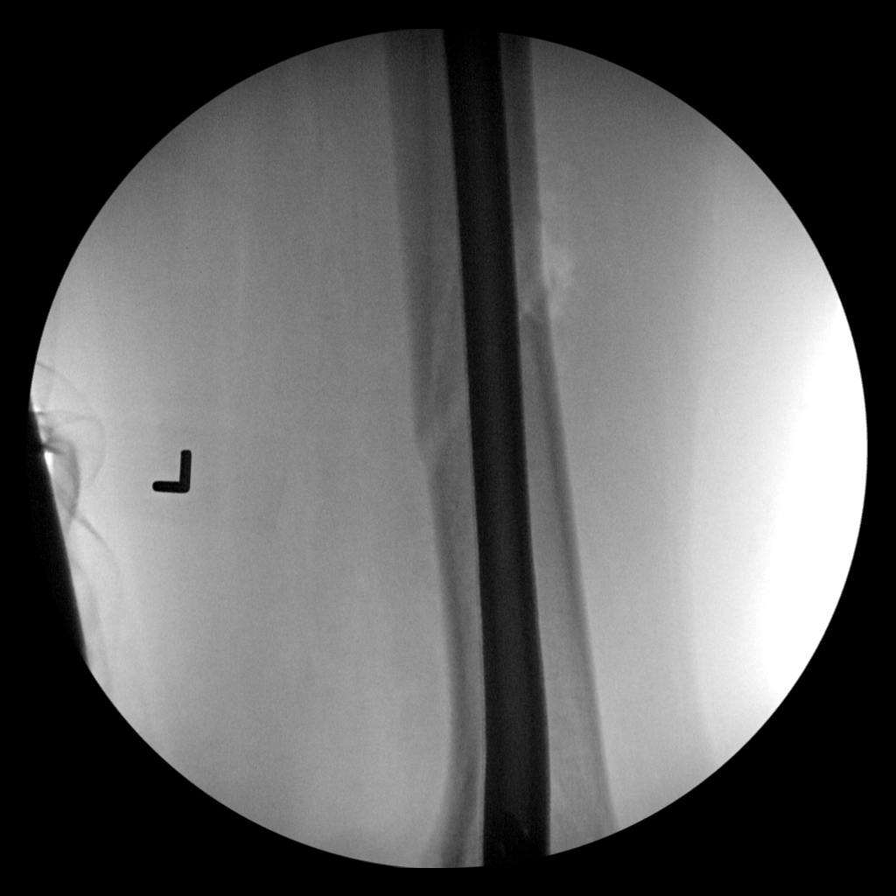
[im 3/6]
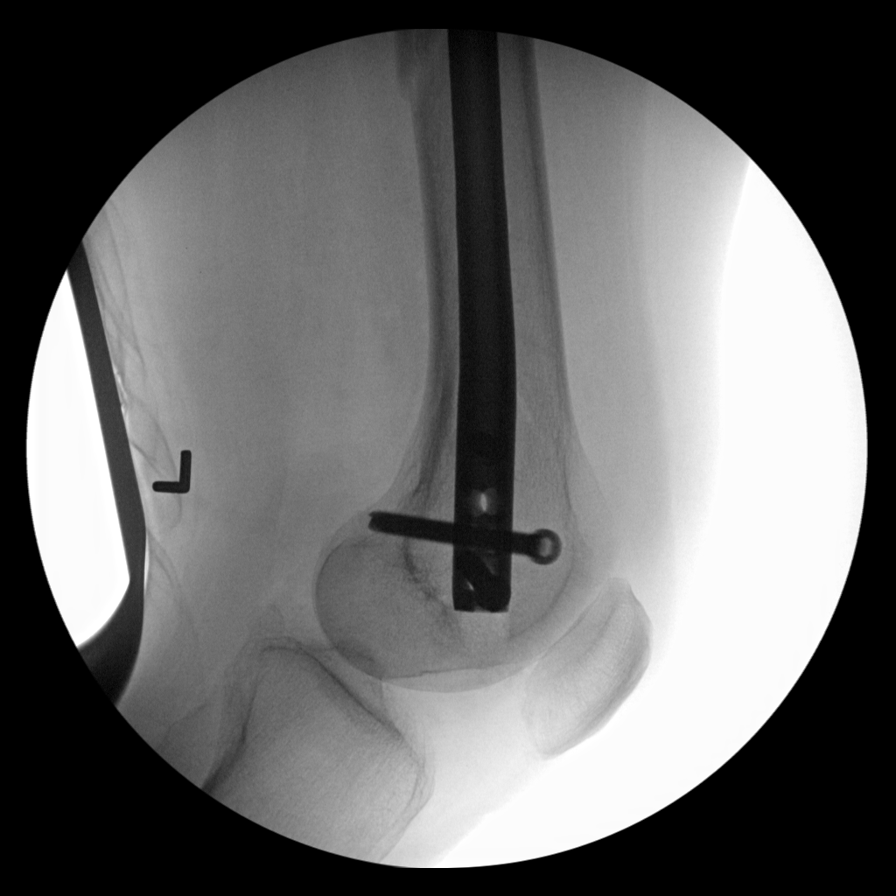
[im 4/6]
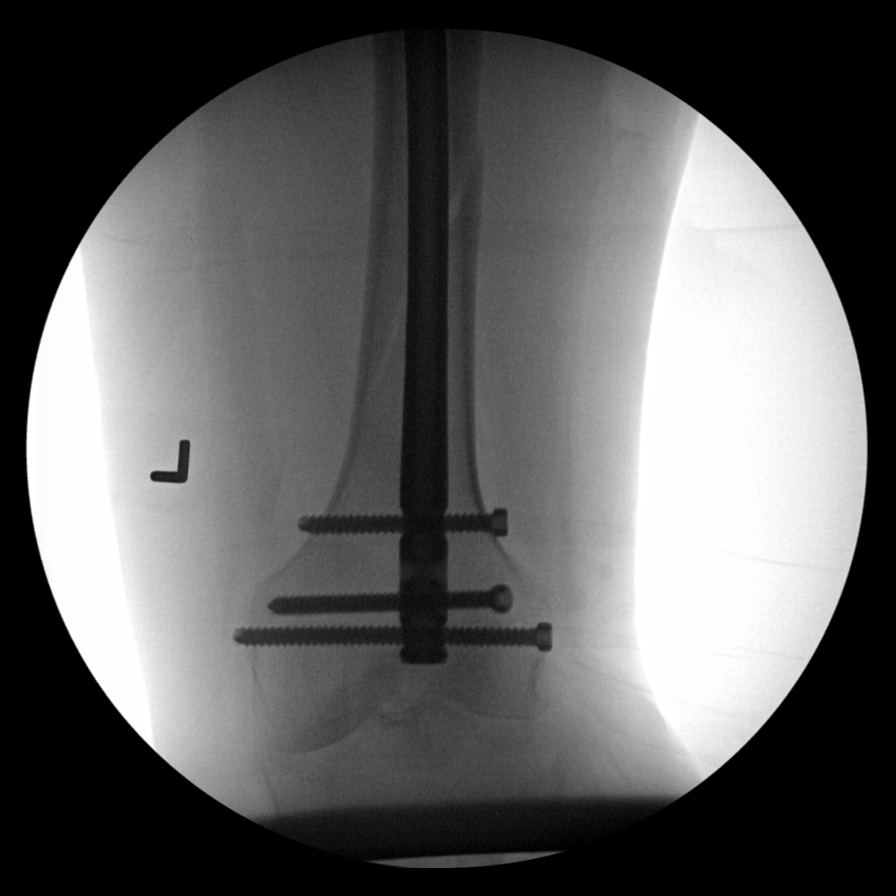
[im 5/6]
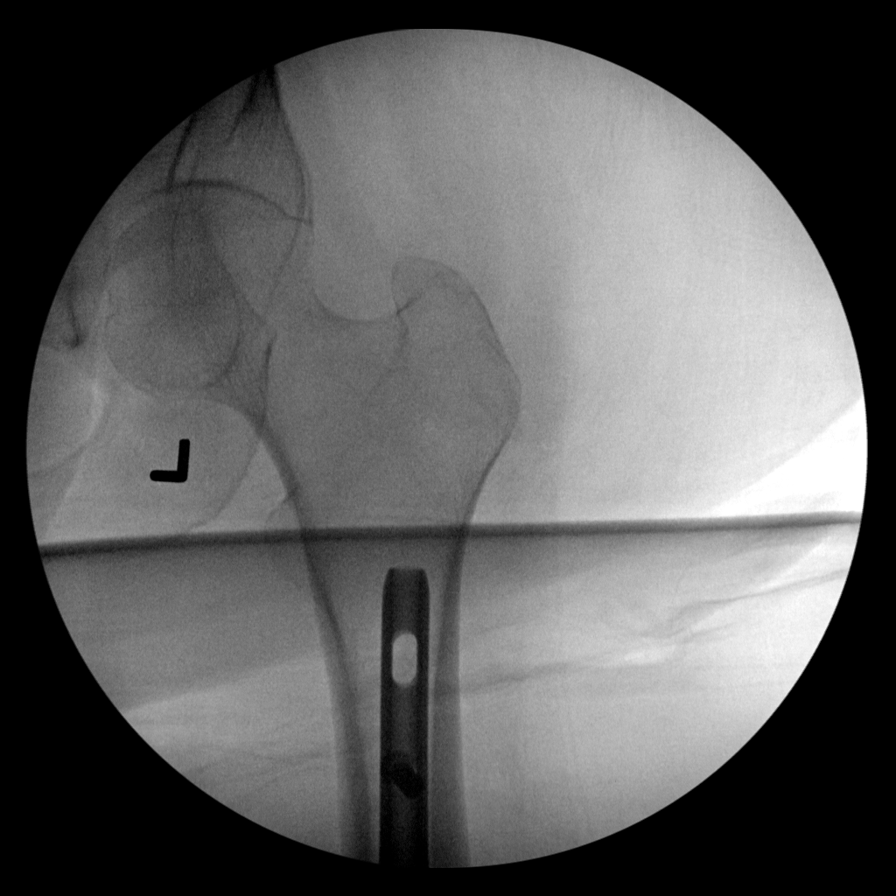
[im 6/6]
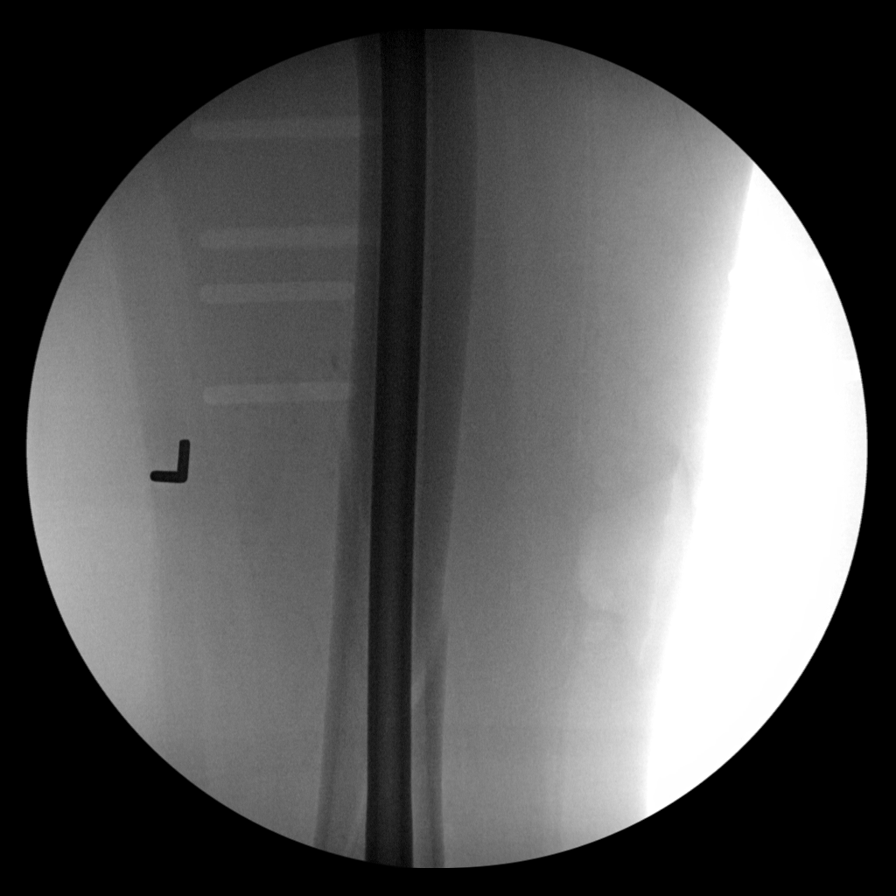

[6 of 6 positions shown; findings below may reference images not displayed]

FINDINGS: The displaced spiral fracture of mid to distal left femoral shaft
has been reduced with an intramedullary rod. The fracture is in near
anatomic alignment. The orthopedic hardware appears well seated.
IMPRESSION: Well-aligned left femur fracture following ORIF.

## 2020-04-15 DIAGNOSIS — Z7901 Long term (current) use of anticoagulants: Secondary | ICD-10-CM | POA: Diagnosis not present

## 2020-04-15 DIAGNOSIS — H5212 Myopia, left eye: Secondary | ICD-10-CM | POA: Diagnosis not present

## 2020-04-15 DIAGNOSIS — H5201 Hypermetropia, right eye: Secondary | ICD-10-CM | POA: Diagnosis not present

## 2020-04-15 DIAGNOSIS — H01015 Ulcerative blepharitis left lower eyelid: Secondary | ICD-10-CM | POA: Diagnosis not present

## 2020-04-15 DIAGNOSIS — H524 Presbyopia: Secondary | ICD-10-CM | POA: Diagnosis not present

## 2020-05-04 ENCOUNTER — Encounter (HOSPITAL_COMMUNITY): Payer: Self-pay | Admitting: Emergency Medicine

## 2020-05-04 ENCOUNTER — Other Ambulatory Visit: Payer: Self-pay

## 2020-05-04 ENCOUNTER — Emergency Department (HOSPITAL_COMMUNITY): Payer: Medicare HMO

## 2020-05-04 ENCOUNTER — Inpatient Hospital Stay (HOSPITAL_COMMUNITY)
Admission: EM | Admit: 2020-05-04 | Discharge: 2020-05-10 | DRG: 683 | Disposition: A | Discharge status: Home Health | Payer: Medicare HMO | Attending: Internal Medicine | Admitting: Internal Medicine

## 2020-05-04 DIAGNOSIS — E876 Hypokalemia: Secondary | ICD-10-CM | POA: Diagnosis present

## 2020-05-04 DIAGNOSIS — D689 Coagulation defect, unspecified: Secondary | ICD-10-CM | POA: Diagnosis present

## 2020-05-04 DIAGNOSIS — Z20822 Contact with and (suspected) exposure to covid-19: Secondary | ICD-10-CM | POA: Diagnosis not present

## 2020-05-04 DIAGNOSIS — D849 Immunodeficiency, unspecified: Secondary | ICD-10-CM | POA: Diagnosis present

## 2020-05-04 DIAGNOSIS — E162 Hypoglycemia, unspecified: Secondary | ICD-10-CM | POA: Diagnosis not present

## 2020-05-04 DIAGNOSIS — R531 Weakness: Secondary | ICD-10-CM

## 2020-05-04 DIAGNOSIS — Z8673 Personal history of transient ischemic attack (TIA), and cerebral infarction without residual deficits: Secondary | ICD-10-CM

## 2020-05-04 DIAGNOSIS — I639 Cerebral infarction, unspecified: Secondary | ICD-10-CM | POA: Diagnosis not present

## 2020-05-04 DIAGNOSIS — R791 Abnormal coagulation profile: Secondary | ICD-10-CM

## 2020-05-04 DIAGNOSIS — I129 Hypertensive chronic kidney disease with stage 1 through stage 4 chronic kidney disease, or unspecified chronic kidney disease: Secondary | ICD-10-CM | POA: Diagnosis present

## 2020-05-04 DIAGNOSIS — M329 Systemic lupus erythematosus, unspecified: Secondary | ICD-10-CM | POA: Diagnosis present

## 2020-05-04 DIAGNOSIS — D6862 Lupus anticoagulant syndrome: Secondary | ICD-10-CM | POA: Diagnosis present

## 2020-05-04 DIAGNOSIS — Z79899 Other long term (current) drug therapy: Secondary | ICD-10-CM

## 2020-05-04 DIAGNOSIS — N1831 Chronic kidney disease, stage 3a: Secondary | ICD-10-CM | POA: Diagnosis present

## 2020-05-04 DIAGNOSIS — E1122 Type 2 diabetes mellitus with diabetic chronic kidney disease: Secondary | ICD-10-CM | POA: Diagnosis present

## 2020-05-04 DIAGNOSIS — N179 Acute kidney failure, unspecified: Secondary | ICD-10-CM | POA: Diagnosis present

## 2020-05-04 DIAGNOSIS — Z8249 Family history of ischemic heart disease and other diseases of the circulatory system: Secondary | ICD-10-CM

## 2020-05-04 DIAGNOSIS — Z7984 Long term (current) use of oral hypoglycemic drugs: Secondary | ICD-10-CM

## 2020-05-04 DIAGNOSIS — Z9109 Other allergy status, other than to drugs and biological substances: Secondary | ICD-10-CM

## 2020-05-04 DIAGNOSIS — E11649 Type 2 diabetes mellitus with hypoglycemia without coma: Secondary | ICD-10-CM | POA: Diagnosis present

## 2020-05-04 DIAGNOSIS — I4891 Unspecified atrial fibrillation: Secondary | ICD-10-CM | POA: Diagnosis present

## 2020-05-04 DIAGNOSIS — E119 Type 2 diabetes mellitus without complications: Secondary | ICD-10-CM

## 2020-05-04 DIAGNOSIS — E860 Dehydration: Secondary | ICD-10-CM | POA: Diagnosis present

## 2020-05-04 DIAGNOSIS — Z7901 Long term (current) use of anticoagulants: Secondary | ICD-10-CM

## 2020-05-04 DIAGNOSIS — I10 Essential (primary) hypertension: Secondary | ICD-10-CM | POA: Diagnosis present

## 2020-05-04 DIAGNOSIS — T45515A Adverse effect of anticoagulants, initial encounter: Secondary | ICD-10-CM | POA: Diagnosis present

## 2020-05-04 LAB — CBG MONITORING, ED
Glucose-Capillary: 164 mg/dL — ABNORMAL HIGH (ref 70–99)
Glucose-Capillary: 211 mg/dL — ABNORMAL HIGH (ref 70–99)
Glucose-Capillary: 87 mg/dL (ref 70–99)
Glucose-Capillary: 93 mg/dL (ref 70–99)

## 2020-05-04 LAB — HEPATIC FUNCTION PANEL
ALT: 16 U/L (ref 0–44)
AST: 23 U/L (ref 15–41)
Albumin: 3.6 g/dL (ref 3.5–5.0)
Alkaline Phosphatase: 56 U/L (ref 38–126)
Bilirubin, Direct: 0.1 mg/dL (ref 0.0–0.2)
Indirect Bilirubin: 0.7 mg/dL (ref 0.3–0.9)
Total Bilirubin: 0.8 mg/dL (ref 0.3–1.2)
Total Protein: 7.7 g/dL (ref 6.5–8.1)

## 2020-05-04 LAB — CBC
HCT: 44.6 % (ref 36.0–46.0)
Hemoglobin: 14.2 g/dL (ref 12.0–15.0)
MCH: 28.4 pg (ref 26.0–34.0)
MCHC: 31.8 g/dL (ref 30.0–36.0)
MCV: 89.2 fL (ref 80.0–100.0)
Platelets: 260 10*3/uL (ref 150–400)
RBC: 5 MIL/uL (ref 3.87–5.11)
RDW: 13.9 % (ref 11.5–15.5)
WBC: 12.4 10*3/uL — ABNORMAL HIGH (ref 4.0–10.5)
nRBC: 0 % (ref 0.0–0.2)

## 2020-05-04 LAB — LIPASE, BLOOD: Lipase: 60 U/L — ABNORMAL HIGH (ref 11–51)

## 2020-05-04 LAB — BASIC METABOLIC PANEL
Anion gap: 16 — ABNORMAL HIGH (ref 5–15)
BUN: 71 mg/dL — ABNORMAL HIGH (ref 8–23)
CO2: 24 mmol/L (ref 22–32)
Calcium: 13.7 mg/dL (ref 8.9–10.3)
Chloride: 97 mmol/L — ABNORMAL LOW (ref 98–111)
Creatinine, Ser: 2.08 mg/dL — ABNORMAL HIGH (ref 0.44–1.00)
GFR calc Af Amer: 27 mL/min — ABNORMAL LOW (ref >60)
GFR calc non Af Amer: 23 mL/min — ABNORMAL LOW (ref >60)
Glucose, Bld: 165 mg/dL — ABNORMAL HIGH (ref 70–99)
Potassium: 4 mmol/L (ref 3.5–5.1)
Sodium: 137 mmol/L (ref 135–145)

## 2020-05-04 LAB — PROTIME-INR
INR: >10 (ref 0.8–1.2)
Prothrombin Time: 80 seconds — ABNORMAL HIGH (ref 11.4–15.2)

## 2020-05-04 LAB — SARS CORONAVIRUS 2 BY RT PCR (HOSPITAL ORDER, PERFORMED IN ~~LOC~~ HOSPITAL LAB): SARS Coronavirus 2: NEGATIVE

## 2020-05-04 LAB — TROPONIN I (HIGH SENSITIVITY)
Troponin I (High Sensitivity): 25 ng/L — ABNORMAL HIGH (ref <18)
Troponin I (High Sensitivity): 32 ng/L — ABNORMAL HIGH (ref <18)

## 2020-05-04 LAB — MAGNESIUM: Magnesium: 1.8 mg/dL (ref 1.7–2.4)

## 2020-05-04 LAB — TSH: TSH: 0.064 u[IU]/mL — ABNORMAL LOW (ref 0.350–4.500)

## 2020-05-04 MED ORDER — SODIUM CHLORIDE 0.9 % IV SOLN: Freq: Once | INTRAVENOUS | Status: AC

## 2020-05-04 MED ORDER — TOBRAMYCIN 0.3 % OP SOLN
1.0000 [drp] | Freq: Four times a day (QID) | OPHTHALMIC | Status: DC
  Administered 2020-05-04 – 2020-05-10 (×21): 1 [drp] via OPHTHALMIC
  Filled 2020-05-04 (×4): qty 5

## 2020-05-04 MED ORDER — VITAMIN K1 10 MG/ML IJ SOLN
2.5000 mg | Freq: Once | INTRAVENOUS | Status: AC
  Administered 2020-05-04: 2.5 mg via INTRAVENOUS
  Filled 2020-05-04: qty 0.25

## 2020-05-04 MED ORDER — ONDANSETRON HCL 4 MG PO TABS: 4.0000 mg | ORAL_TABLET | Freq: Four times a day (QID) | ORAL | Status: DC | PRN

## 2020-05-04 MED ORDER — SODIUM CHLORIDE 0.9 % IV BOLUS
500.0000 mL | Freq: Once | INTRAVENOUS | Status: AC
  Administered 2020-05-04: 500 mL via INTRAVENOUS

## 2020-05-04 MED ORDER — METOPROLOL TARTRATE 100 MG PO TABS
100.0000 mg | ORAL_TABLET | Freq: Two times a day (BID) | ORAL | Status: DC
  Administered 2020-05-04 – 2020-05-05 (×2): 100 mg via ORAL
  Filled 2020-05-04 (×2): qty 4

## 2020-05-04 MED ORDER — ONDANSETRON HCL 4 MG/2ML IJ SOLN: 4.0000 mg | Freq: Four times a day (QID) | INTRAMUSCULAR | Status: DC | PRN

## 2020-05-04 MED ORDER — AMLODIPINE BESYLATE 5 MG PO TABS: 10.0000 mg | ORAL_TABLET | Freq: Every day | ORAL | Status: DC

## 2020-05-04 MED ORDER — ATORVASTATIN CALCIUM 10 MG PO TABS
20.0000 mg | ORAL_TABLET | Freq: Every day | ORAL | Status: DC
  Administered 2020-05-05 – 2020-05-10 (×6): 20 mg via ORAL
  Filled 2020-05-04 (×6): qty 2

## 2020-05-04 MED ORDER — SODIUM CHLORIDE 0.9 % IV SOLN: INTRAVENOUS | Status: AC

## 2020-05-04 MED ORDER — SODIUM CHLORIDE 0.9% FLUSH: 3.0000 mL | Freq: Once | INTRAVENOUS | Status: DC

## 2020-05-04 NOTE — Progress Notes (Signed)
ANTICOAGULATION CONSULT NOTE - Initial Consult  Pharmacy Consult for coumadin Indication: atrial fibrillation  Allergies  Allergen Reactions  . Tape Other (See Comments)    CERTAIN MEDICAL TAPES HURT AND BURN THE SKIN    Patient Measurements: Height: 4\' 6"  (137.2 cm) Weight: 59 kg (130 lb 1.1 oz) IBW/kg (Calculated) : 31.7  Vital Signs: Temp: 97.7 F (36.5 C) (07/11 1158) Temp Source: Oral (07/11 1158) BP: 132/77 (07/11 2100) Pulse Rate: 94 (07/11 2100)  Labs: Recent Labs    05/04/20 1219 05/04/20 1754 05/04/20 2009  HGB 14.2  --   --   HCT 44.6  --   --   PLT 260  --   --   LABPROT  --  80.0*  --   INR  --  >10.0*  --   CREATININE 2.08*  --   --   TROPONINIHS 25*  --  32*    Estimated Creatinine Clearance: 16.7 mL/min (A) (by C-G formula based on SCr of 2.08 mg/dL (H)).   Medical History: Past Medical History:  Diagnosis Date  . Cancer ([place])    skin  . Diabetes mellitus without complication ([place])   . Hypertension   . Immune deficiency disorder ([place])   . Lupus (systemic lupus erythematosus) ([place])   . Renal disorder    Stage 2 KD  . Stroke [facility])    2009    Medications:  See medication history  Assessment: 69 yo lady to continue home coumadin therapy. Admission INR >10.  MD ordered 2.5 mg Vit K IV.   Goal of Therapy:  INR 2-3 Monitor platelets by anticoagulation protocol: Yes   Plan:  Hold coumadin for now Daily PT/INR  Excell Seltzer Poteet 05/04/2020,10:22 PM

## 2020-05-04 NOTE — H&P (Addendum)
History and Physical    Rhonda Contreras YPP:[id number] DOB: 01-Aug-1951 DOA: 05/04/2020  PCP: Wenda Low, MD  Patient coming from: Home.  Chief Complaint: Weakness.  HPI: Rhonda Contreras is a 69 y.o. female with history of stroke, A. fib on Coumadin and metoprolol, history of lupus, hypertension was brought to the ER after patient was having increasing weakness over the last 4 to 5 days.  Patient states that patient was feeling weak but did not think of coming to the ER until her sister came to visit her and found that she was weak and was brought to the ER.  Has some nausea denies any abdominal pain or diarrhea.  Has been taking her medications as prescribed.  Patient does not remember the last time she went to her primary care physician to check her INR for the Coumadin.  Has been recently to an ophthalmologist for left eye congestion.  Denies any visual problems otherwise.  Ophthalmologist prescribed an eyedrop for possible infection.  ED Course: In the ER patient appears generally weak nonfocal CT head shows nonacute old strokes which I confirmed with on-call neurologist Dr. Karena Addison to rule.  EKG shows normal sinus rhythm with nonspecific ST changes which I confirmed with cardiologist.  Labs were remarkable for creatinine 1 of 2.08 was normal 2 years ago calcium of 13.7 anion gap of 16 patient was hypoglycemic and lipase is mildly elevated at 60 with normal LFTs.  WBC count was 12.4 hemoglobin 14.2.  Patient was given vitamin K 2.5 mg IV for the coagulopathy.  On exam except for the left eye congestion there is no obvious signs of any bleed.  Patient denies any blood in the stools.  Patient was started on IV fluid bolus and fluid infusion for hypercalcemia renal failure and admitted for further management of acute renal failure with hypercalcemia poor oral intake and coagulopathy.  Review of Systems: As per HPI, rest all negative.   Past Medical History:  Diagnosis Date   Cancer ([place])     skin   Diabetes mellitus without complication ([place])    Hypertension    Immune deficiency disorder ([place])    Lupus (systemic lupus erythematosus) ([place])    Renal disorder    Stage 2 KD   Stroke [facility])    2009    Past Surgical History:  Procedure Laterality Date   FEMUR IM NAIL Left 02/02/2018   Procedure: LEFT FEMUR INTRAMEDULLARY (IM) NAIL;  Surgeon: Renette Butters, MD;  Location: [facility];  Service: Orthopedics;  Laterality: Left;   TONSILLECTOMY       reports that she has never smoked. She has never used smokeless tobacco. She reports previous alcohol use. She reports that she does not use drugs.  Allergies  Allergen Reactions   Tape Other (See Comments)    CERTAIN MEDICAL TAPES HURT AND BURN THE SKIN    Family History  Problem Relation Age of Onset   Heart attack Mother     Prior to Admission medications   Medication Sig Start Date End Date Taking? Authorizing Provider  acetaminophen (TYLENOL) 325 MG tablet Take 325-650 mg by mouth every 6 (six) hours as needed for mild pain or headache.    Yes [provider]  alendronate (FOSAMAX) 70 MG tablet Take 70 mg by mouth every Saturday.  12/27/17  Yes [provider]  amLODipine (NORVASC) 10 MG tablet Take 10 mg by mouth daily. 12/29/17  Yes [provider]  atorvastatin (LIPITOR) 20 MG tablet  Take 20 mg by mouth daily. 12/29/17  Yes [provider]  glimepiride (AMARYL) 1 MG tablet Take 1 mg by mouth daily. 12/29/17  Yes [provider]  lisinopril-hydrochlorothiazide (ZESTORETIC) 10-12.5 MG tablet Take 1 tablet by mouth daily. 02/26/20  Yes [provider]  loratadine (CLARITIN) 10 MG tablet Take 10 mg by mouth in the morning.   Yes [provider]  metFORMIN (GLUCOPHAGE) 1000 MG tablet Take 1,000 mg by mouth at bedtime.    Yes [provider]  metoprolol tartrate (LOPRESSOR) 100 MG tablet Take 100 mg by mouth 2 (two) times daily. 12/29/17  Yes [provider]  polyethylene glycol (MIRALAX / GLYCOLAX) packet Take 17 g by mouth daily. Patient taking differently: Take 17 g by mouth daily as needed for mild constipation.  02/07/18  Yes Hall, Carole N, DO  tobramycin (TOBREX) 0.3 % ophthalmic solution Place 1 drop into the left eye 4 (four) times daily. 04/16/20  Yes [provider]  warfarin (COUMADIN) 5 MG tablet Take 2.5-5 mg by mouth See admin instructions. Take 2.5 mg by mouth at bedtime on Sun/Tues/Wed/Thurs/Sat and 5 mg on Mon/Fri 12/26/17  Yes [provider]  docusate sodium (COLACE) 100 MG capsule Take 1 capsule (100 mg total) by mouth 2 (two) times daily. To prevent constipation while taking pain medication. Patient not taking: Reported on 05/04/2020 02/02/18   Prudencio Burly III, PA-C  HYDROcodone-acetaminophen (NORCO) 5-325 MG tablet Take 1-2 tablets by mouth every 6 (six) hours as needed for moderate pain. Patient not taking: Reported on 05/04/2020 02/02/18   Prudencio Burly III, PA-C  ketoconazole (NIZORAL) 2 % cream Apply 1 application topically daily. On her face Patient not taking: Reported on 05/04/2020 01/10/18   [provider]    Physical Exam: Constitutional: Moderately built and nourished. Vitals:   05/04/20 1647 05/04/20 1930 05/04/20 2047 05/04/20 2100  BP:  110/63 118/71 132/77  Pulse:  85 90 94  Resp:  20 (!) 21 18  Temp:      TempSrc:      SpO2:  97% 94% 95%  Weight: 59 kg     Height: 4\' 6"  (1.372 m)      Eyes: Left eye is congested. ENMT: No discharge from the ears eyes nose or mouth. Neck: No mass felt.  No neck rigidity. Respiratory: No rhonchi or crepitations. Cardiovascular: S1-S2 heard. Abdomen: Soft nontender bowel sounds present. Musculoskeletal: No edema. Skin: No rash. Neurologic: Alert awake oriented to time place and person.  Moves all extremities. Psychiatric: Appears normal.   Labs on Admission: I have personally reviewed following labs and imaging  studies  CBC: Recent Labs  Lab 05/04/20 1219  WBC 12.4*  HGB 14.2  HCT 44.6  MCV 89.2  PLT 295   Basic Metabolic Panel: Recent Labs  Lab 05/04/20 1219  NA 137  K 4.0  CL 97*  CO2 24  GLUCOSE 165*  BUN 71*  CREATININE 2.08*  CALCIUM 13.7*  MG 1.8   GFR: Estimated Creatinine Clearance: 16.7 mL/min (A) (by C-G formula based on SCr of 2.08 mg/dL (H)). Liver Function Tests: Recent Labs  Lab 05/04/20 1219  AST 23  ALT 16  ALKPHOS 56  BILITOT 0.8  PROT 7.7  ALBUMIN 3.6   Recent Labs  Lab 05/04/20 1219  LIPASE 60*   No results for input(s): AMMONIA in the last 168 hours. Coagulation Profile: Recent Labs  Lab 05/04/20 1754  INR >10.0*   Cardiac Enzymes: No  results for input(s): CKTOTAL, CKMB, CKMBINDEX, TROPONINI in the last 168 hours. BNP (last 3 results) No results for input(s): PROBNP in the last 8760 hours. HbA1C: No results for input(s): HGBA1C in the last 72 hours. CBG: Recent Labs  Lab 05/04/20 1220 05/04/20 2137 05/04/20 2150  GLUCAP 164* 87 93   Lipid Profile: No results for input(s): CHOL, HDL, LDLCALC, TRIG, CHOLHDL, LDLDIRECT in the last 72 hours. Thyroid Function Tests: Recent Labs    05/04/20 1754  TSH 0.064*   Anemia Panel: No results for input(s): VITAMINB12, FOLATE, FERRITIN, TIBC, IRON, RETICCTPCT in the last 72 hours. Urine analysis:    Component Value Date/Time   COLORURINE YELLOW 05/16/2008 0330   APPEARANCEUR CLEAR 05/16/2008 0330   LABSPEC 1.013 05/16/2008 0330   PHURINE 6.0 05/16/2008 0330   GLUCOSEU 500 (A) 05/16/2008 0330   HGBUR NEGATIVE 05/16/2008 0330   BILIRUBINUR NEGATIVE 05/16/2008 0330   KETONESUR 15 (A) 05/16/2008 0330   PROTEINUR NEGATIVE 05/16/2008 0330   UROBILINOGEN 1.0 05/16/2008 0330   NITRITE NEGATIVE 05/16/2008 0330   LEUKOCYTESUR NEGATIVE 05/16/2008 0330   Sepsis Labs: @LABRCNTIP (procalcitonin:4,lacticidven:4) ) Recent Results (from the past 240 hour(s))  SARS Coronavirus 2 by RT PCR  (hospital order, performed in [facility] hospital lab) Nasopharyngeal Nasopharyngeal Swab     Status: None   Collection Time: 05/04/20  4:33 PM   Specimen: Nasopharyngeal Swab  Result Value Ref Range Status   SARS Coronavirus 2 NEGATIVE NEGATIVE Final    Comment: (NOTE) SARS-CoV-2 target nucleic acids are NOT DETECTED.  The SARS-CoV-2 RNA is generally detectable in upper and lower respiratory specimens during the acute phase of infection. The lowest concentration of SARS-CoV-2 viral copies this assay can detect is 250 copies / mL. A negative result does not preclude SARS-CoV-2 infection and should not be used as the sole basis for treatment or other patient management decisions.  A negative result may occur with improper specimen collection / handling, submission of specimen other than nasopharyngeal swab, presence of viral mutation(s) within the areas targeted by this assay, and inadequate number of viral copies (<250 copies / mL). A negative result must be combined with clinical observations, patient history, and epidemiological information.  Fact Sheet for Patients:   StrictlyIdeas.no  Fact Sheet for Healthcare Providers: BankingDealers.co.za  This test is not yet approved or  cleared by the [place] FDA and has been authorized for detection and/or diagnosis of SARS-CoV-2 by FDA under an Emergency Use Authorization (EUA).  This EUA will remain in effect (meaning this test can be used) for the duration of the COVID-19 declaration under Section 564(b)(1) of the Act, 21 U.S.C. section 360bbb-3(b)(1), unless the authorization is terminated or revoked sooner.  Performed at [facility], [place] [address], [place], [place] [address]      Radiological Exams on Admission: CT Head Wo Contrast  Result Date: 05/04/2020 CLINICAL DATA:  Focal neuro deficit.  Generalized weakness. EXAM: CT HEAD WITHOUT CONTRAST TECHNIQUE:  Contiguous axial images were obtained from the base of the skull through the vertex without intravenous contrast. COMPARISON:  May 18, 2008 FINDINGS: Brain: No subdural, epidural, or subarachnoid hemorrhage. Moderate white matter changes are identified. Lacunar infarcts are seen in the right caudate head, right thalamus, the left thalamus, unchanged since 2009. There is a small right frontal infarct seen on coronal image 29. There is a small left parietal infarct on axial image 21 not seen in 2009. No other infarcts identified. No acute ischemia noted. Ventricles and sulci  are unremarkable. Lacunar infarcts are again seen in the bilateral cerebellar hemispheres, nonacute in appearance. Brainstem is normal. Basal cisterns are unremarkable. No mass effect or midline shift. Vascular: Calcified atherosclerosis is seen in the intracranial carotids Skull: Normal. Negative for fracture or focal lesion. Sinuses/Orbits: No acute finding. Other: None. IMPRESSION: 1. Small infarct in the right frontal lobe, favored to be nonacute. Small infarct in the left parietal lobe, also favored to be nonacute. Neither of these infarcts were seen in 2009. Moderate white matter changes and scattered lacunar infarcts in the basal ganglia and cerebellum are stable since 2009. No definitive acute intracranial abnormality identified on this study. Electronically Signed   By: Dorise Bullion III M.D   On: 05/04/2020 17:09   DG Chest Portable 1 View  Result Date: 05/04/2020 CLINICAL DATA:  Altered level of consciousness, generalized weakness, anorexia EXAM: PORTABLE CHEST 1 VIEW COMPARISON:  02/01/2018 FINDINGS: The heart size and mediastinal contours are within normal limits. Both lungs are clear. The visualized skeletal structures are unremarkable. IMPRESSION: No active disease. Electronically Signed   By: Randa Ngo M.D.   On: 05/04/2020 17:41    EKG: Independently reviewed.  Normal sinus rhythm nonspecific T changes.  Discussed  with cardiologist.  Assessment/Plan Principal Problem:   ARF (acute renal failure) ([place]) Active Problems:   Diabetes mellitus type 2 in nonobese [facility])   Essential hypertension   History of CVA (cerebrovascular accident)   Hypercalcemia   Coagulopathy ([facility])   Hypoglycemia    1. Acute renal failure likely from poor oral intake.  UA shows negative for nitrites and leukocyte but no WBCs.  Hyaline casts.  Patient also takes lisinopril and hydrochlorothiazide which will be held for now until creatinine improves.  Closely follow intake output and metabolic panel. 2. Hypercalcemia with no EKG changes.  Patient states she takes vitamin D and calcium every day twice.  Will check vitamin D levels phosphorus and parathormone levels.  Continue with hydration follow metabolic panel. 3. Coagulopathy secondary to Coumadin -patient does not exactly recall when his last time she went to her primary care to check her Coumadin.  Vitamin K 2.5 mg IV was given in the ER follow INR closely.  No definite evidence of any active bleed at this time. 4. Hypertension we will hold off amlodipine and lisinopril hydrochlorothiazide since patient's blood pressure is in the low normal and also because of acute renal failure we are holding of lisinopril hydrochlorothiazide.  We will continue metoprolol for rate control. 5. Poor appetite with some nausea lipase is mildly elevated I have ordered CT abdomen without contrast.  Follow LFTs lipase CT results. 6. History of diabetes mellitus type 2 on oral antidiabetic medications which will be held due to hypoglycemia in the setting of renal failure.  Closely follow CBGs the second 1 was around 93. 7. History of previous stroke on Coumadin. 8. History of lupus mention in the chart but not on any medication as of now.  Since patient has acute renal failure with severe hypercalcemia severe coagulopathy will need close monitoring for any further worsening in inpatient status.  I try  to reach patient's sister was unable to reach.  May have to get further history from patient's sister about patient's condition at home.   DVT prophylaxis: Patient is on SCDs and patient has coagulopathy. Code Status: Full code. Family Communication: Unable to reach patient's sister. Disposition Plan: To be determined. Consults called: Discussed with neurologist and cardiologist. Admission status: Inpatient.   Jaquita Rector  Aida Puffer MD [facility] Pager [phone].  If 7PM-7AM, please contact night-coverage [email or web address] Password [facility]  05/04/2020, 10:18 PM

## 2020-05-04 NOTE — ED Triage Notes (Signed)
C/o generalized weakness and not eating x 2 days.  Denies pain. Pt lives by herself.

## 2020-05-04 NOTE — ED Notes (Signed)
Patient transported to CT 

## 2020-05-04 NOTE — ED Provider Notes (Signed)
Emergency Department Provider Note   I have reviewed the triage vital signs and the nursing notes.   HISTORY  Chief Complaint Weakness   HPI Rhonda Contreras is a 69 y.o. female with past medical history reviewed below including lupus and stage II CKD presents to the emergency department with worsening generalized weakness with poor PO intake over the past 2 days.  Patient lives alone and is checked on by her sister today.  She has been feeling progressively more weak and this morning had a lot of trouble getting up off of the couch which is where she sleeps at night.  She was able to get up but felt bad to the point of calling her sister who transported her to the emergency department.  The patient has been trying to drink fluids but has not been eating much food at all.  The sister called EMS and they arrived to find the patient hypoglycemic to the 78s.  Patient was given a sandwich and symptoms improved but was transported to the emergency department for evaluation.  She denies any chest pain, shortness of breath, recent falls, abdominal pain, vomiting, diarrhea. No fever. Sister notes a prior history of CVA.   Past Medical History:  Diagnosis Date  . Cancer ([place])    skin  . Diabetes mellitus without complication ([place])   . Hypertension   . Immune deficiency disorder ([place])   . Lupus (systemic lupus erythematosus) ([place])   . Renal disorder    Stage 2 KD  . Stroke [facility])    2009    Patient Active Problem List   Diagnosis Date Noted  . Diabetes mellitus type 2 in nonobese ([place]) 02/02/2018  . Chronic anticoagulation 02/02/2018  . Essential hypertension 02/02/2018  . History of CVA (cerebrovascular accident) 02/02/2018  . Closed displaced spiral fracture of shaft of left femur ([place]) 02/01/2018    Past Surgical History:  Procedure Laterality Date  . FEMUR IM NAIL Left 02/02/2018   Procedure: LEFT FEMUR INTRAMEDULLARY (IM) NAIL;  Surgeon: Renette Butters, MD;  Location: [facility];   Service: Orthopedics;  Laterality: Left;  . TONSILLECTOMY      Allergies Patient has no known allergies.  Family History  Problem Relation Age of Onset  . Heart attack Mother     Social History Social History   Tobacco Use  . Smoking status: Never Smoker  . Smokeless tobacco: Never Used  Vaping Use  . Vaping Use: Never used  Substance Use Topics  . Alcohol use: Not Currently  . Drug use: Never    Review of Systems  Constitutional: No fever/chills. Positive generalized weakness.  Eyes: No visual changes. ENT: No sore throat. Cardiovascular: Denies chest pain. Respiratory: Denies shortness of breath. Gastrointestinal: No abdominal pain. Negative nausea, no vomiting.  No diarrhea.  No constipation. Poor appetite.  Genitourinary: Negative for dysuria. Musculoskeletal: Negative for back pain. Skin: Negative for rash. Neurological: Negative for headaches, focal weakness or numbness.  10-point ROS otherwise negative.  ____________________________________________   PHYSICAL EXAM:  VITAL SIGNS: ED Triage Vitals  Enc Vitals Group     BP 05/04/20 1158 111/84     Pulse Rate 05/04/20 1158 94     Resp 05/04/20 1158 18     Temp 05/04/20 1158 97.7 F (36.5 C)     Temp Source 05/04/20 1158 Oral     SpO2 05/04/20 1158 97 %   Constitutional: Alert and oriented. Well appearing and in no acute distress. Eyes: Conjunctivae injected on  the left. EOMI. PERRL.  Head: Atraumatic. Nose: No congestion/rhinnorhea. Mouth/Throat: Mucous membranes are dry.  Neck: No stridor.  Cardiovascular: Normal rate, regular rhythm. Good peripheral circulation. Grossly normal heart sounds.   Respiratory: Normal respiratory effort.  No retractions. Lungs CTAB. Gastrointestinal: Soft and nontender. No distention.  Musculoskeletal: No lower extremity tenderness nor edema. No gross deformities of extremities. Neurologic:  Normal speech and language. No gross focal neurologic deficits are  appreciated. No facial asymmetry. No weakness/numbness in the upper or lower extremities.  Skin:  Skin is warm, dry and intact. No rash noted.   ____________________________________________   LABS (all labs ordered are listed, but only abnormal results are displayed)  Labs Reviewed  BASIC METABOLIC PANEL - Abnormal; Notable for the following components:      Result Value   Chloride 97 (*)    Glucose, Bld 165 (*)    BUN 71 (*)    Creatinine, Ser 2.08 (*)    Calcium 13.7 (*)    GFR calc non Af Amer 23 (*)    GFR calc Af Amer 27 (*)    Anion gap 16 (*)    All other components within normal limits  CBC - Abnormal; Notable for the following components:   WBC 12.4 (*)    All other components within normal limits  LIPASE, BLOOD - Abnormal; Notable for the following components:   Lipase 60 (*)    All other components within normal limits  TSH - Abnormal; Notable for the following components:   TSH 0.064 (*)    All other components within normal limits  PROTIME-INR - Abnormal; Notable for the following components:   Prothrombin Time 80.0 (*)    INR >10.0 (*)    All other components within normal limits  CBG MONITORING, ED - Abnormal; Notable for the following components:   Glucose-Capillary 164 (*)    All other components within normal limits  TROPONIN I (HIGH SENSITIVITY) - Abnormal; Notable for the following components:   Troponin I (High Sensitivity) 25 (*)    All other components within normal limits  SARS CORONAVIRUS 2 BY RT PCR (HOSPITAL ORDER, [facility])  HEPATIC FUNCTION PANEL  MAGNESIUM  URINALYSIS, ROUTINE W REFLEX MICROSCOPIC  TROPONIN I (HIGH SENSITIVITY)   ____________________________________________  EKG   EKG Interpretation  Date/Time:  Sunday May 04 2020 12:24:03 EDT Ventricular Rate:  99 PR Interval:  164 QRS Duration: 90 QT Interval:  342 QTC Calculation: 438 R Axis:   -46 Text Interpretation: Normal sinus rhythm Left  anterior fascicular block Left ventricular hypertrophy ( R in aVL , Cornell product , Romhilt-Estes ) Septal infarct , age undetermined Possible Lateral infarct , age undetermined Abnormal ECG No STEMI Confirmed by Nanda Quinton [phone]) on 05/04/2020 4:55:07 PM       ____________________________________________  RADIOLOGY  CT Head Wo Contrast  Result Date: 05/04/2020 CLINICAL DATA:  Focal neuro deficit.  Generalized weakness. EXAM: CT HEAD WITHOUT CONTRAST TECHNIQUE: Contiguous axial images were obtained from the base of the skull through the vertex without intravenous contrast. COMPARISON:  May 18, 2008 FINDINGS: Brain: No subdural, epidural, or subarachnoid hemorrhage. Moderate white matter changes are identified. Lacunar infarcts are seen in the right caudate head, right thalamus, the left thalamus, unchanged since 2009. There is a small right frontal infarct seen on coronal image 29. There is a small left parietal infarct on axial image 21 not seen in 2009. No other infarcts identified. No acute ischemia noted. Ventricles and  sulci are unremarkable. Lacunar infarcts are again seen in the bilateral cerebellar hemispheres, nonacute in appearance. Brainstem is normal. Basal cisterns are unremarkable. No mass effect or midline shift. Vascular: Calcified atherosclerosis is seen in the intracranial carotids Skull: Normal. Negative for fracture or focal lesion. Sinuses/Orbits: No acute finding. Other: None. IMPRESSION: 1. Small infarct in the right frontal lobe, favored to be nonacute. Small infarct in the left parietal lobe, also favored to be nonacute. Neither of these infarcts were seen in 2009. Moderate white matter changes and scattered lacunar infarcts in the basal ganglia and cerebellum are stable since 2009. No definitive acute intracranial abnormality identified on this study. Electronically Signed   By: Dorise Bullion III M.D   On: 05/04/2020 17:09   DG Chest Portable 1 View  Result Date:  05/04/2020 CLINICAL DATA:  Altered level of consciousness, generalized weakness, anorexia EXAM: PORTABLE CHEST 1 VIEW COMPARISON:  02/01/2018 FINDINGS: The heart size and mediastinal contours are within normal limits. Both lungs are clear. The visualized skeletal structures are unremarkable. IMPRESSION: No active disease. Electronically Signed   By: Randa Ngo M.D.   On: 05/04/2020 17:41    ____________________________________________   PROCEDURES  Procedure(s) performed:   Procedures  Non e ____________________________________________   INITIAL IMPRESSION / ASSESSMENT AND PLAN / ED COURSE  Pertinent labs & imaging results that were available during my care of the patient were reviewed by me and considered in my medical decision making (see chart for details).   Patient presents to the emergency department with generalized weakness worsening over the past 3 days with poor p.o. intake.  She has borderline low blood pressure here but no fever.  No associated tachycardia.  Patient is on metoprolol at home but did not take that this morning.  She appears well and is feeling better after blood sugar has increased.  Does not appear in distress or severely ill.  Doubt sepsis.  Patient's EKG is unchanged from prior although I do not have a recent tracing in the past 2 years for comparison.  With some nonspecific ST changes and Q waves I do plan to add a troponin with concern for possible atypical angina presentation.  Patient does have creatinine of 2 which is elevated from baseline labs in 2019 but does have history of CKD (stage II).  Patient's calcium is significantly increased up to 13. Plan for IVF and additional labs with head CT and reassess. Will require admit.   07:13 PM  Additional lab work now resulted showing INR > 10.  Hemoglobin is normal.  Patient has not noticed any black or bright red blood per rectum.  Doubt this is significantly contributing given her hypercalcemia and overall  dehydration type picture.  Plan for vitamin K for nonlife-threatening INR reversal and will discuss with the hospitalist regarding admission.   Discussed patient's case with INR to request admission. Patient and family (if present) updated with plan. Care transferred to INR service.  I reviewed all nursing notes, vitals, pertinent old records, EKGs, labs, imaging (as available).  ____________________________________________  FINAL CLINICAL IMPRESSION(S) / ED DIAGNOSES  Final diagnoses:  Generalized weakness  Hypercalcemia  Supratherapeutic INR     MEDICATIONS GIVEN DURING THIS VISIT:  Medications  sodium chloride flush (NS) 0.9 % injection 3 mL (3 mLs Intravenous Not Given 05/04/20 1622)  0.9 %  sodium chloride infusion (has no administration in time range)  phytonadione (VITAMIN K) 2.5 mg in dextrose 5 % 50 mL IVPB (has no administration in time  range)  sodium chloride 0.9 % bolus 500 mL (500 mLs Intravenous New Bag/Given 05/04/20 1907)    Note:  This document was prepared using Dragon voice recognition software and may include unintentional dictation errors.  Nanda Quinton, MD, [facility]    Deran Barro, [facility], MD 05/07/20 [phone]

## 2020-05-05 ENCOUNTER — Observation Stay (HOSPITAL_COMMUNITY): Payer: Medicare HMO

## 2020-05-05 DIAGNOSIS — Z9109 Other allergy status, other than to drugs and biological substances: Secondary | ICD-10-CM | POA: Diagnosis not present

## 2020-05-05 DIAGNOSIS — Z8673 Personal history of transient ischemic attack (TIA), and cerebral infarction without residual deficits: Secondary | ICD-10-CM | POA: Diagnosis not present

## 2020-05-05 DIAGNOSIS — Z8249 Family history of ischemic heart disease and other diseases of the circulatory system: Secondary | ICD-10-CM | POA: Diagnosis not present

## 2020-05-05 DIAGNOSIS — D6862 Lupus anticoagulant syndrome: Secondary | ICD-10-CM | POA: Diagnosis not present

## 2020-05-05 DIAGNOSIS — R531 Weakness: Secondary | ICD-10-CM | POA: Diagnosis present

## 2020-05-05 DIAGNOSIS — M329 Systemic lupus erythematosus, unspecified: Secondary | ICD-10-CM | POA: Diagnosis present

## 2020-05-05 DIAGNOSIS — D689 Coagulation defect, unspecified: Secondary | ICD-10-CM | POA: Diagnosis not present

## 2020-05-05 DIAGNOSIS — D849 Immunodeficiency, unspecified: Secondary | ICD-10-CM | POA: Diagnosis not present

## 2020-05-05 DIAGNOSIS — E876 Hypokalemia: Secondary | ICD-10-CM | POA: Diagnosis not present

## 2020-05-05 DIAGNOSIS — T45515A Adverse effect of anticoagulants, initial encounter: Secondary | ICD-10-CM | POA: Diagnosis present

## 2020-05-05 DIAGNOSIS — E1122 Type 2 diabetes mellitus with diabetic chronic kidney disease: Secondary | ICD-10-CM | POA: Diagnosis present

## 2020-05-05 DIAGNOSIS — I129 Hypertensive chronic kidney disease with stage 1 through stage 4 chronic kidney disease, or unspecified chronic kidney disease: Secondary | ICD-10-CM | POA: Diagnosis present

## 2020-05-05 DIAGNOSIS — Z7901 Long term (current) use of anticoagulants: Secondary | ICD-10-CM | POA: Diagnosis not present

## 2020-05-05 DIAGNOSIS — E11649 Type 2 diabetes mellitus with hypoglycemia without coma: Secondary | ICD-10-CM | POA: Diagnosis not present

## 2020-05-05 DIAGNOSIS — N179 Acute kidney failure, unspecified: Secondary | ICD-10-CM | POA: Diagnosis not present

## 2020-05-05 DIAGNOSIS — E860 Dehydration: Secondary | ICD-10-CM | POA: Diagnosis not present

## 2020-05-05 DIAGNOSIS — N1831 Chronic kidney disease, stage 3a: Secondary | ICD-10-CM | POA: Diagnosis present

## 2020-05-05 DIAGNOSIS — I4891 Unspecified atrial fibrillation: Secondary | ICD-10-CM | POA: Diagnosis present

## 2020-05-05 DIAGNOSIS — K529 Noninfective gastroenteritis and colitis, unspecified: Secondary | ICD-10-CM | POA: Diagnosis not present

## 2020-05-05 DIAGNOSIS — Z20822 Contact with and (suspected) exposure to covid-19: Secondary | ICD-10-CM | POA: Diagnosis not present

## 2020-05-05 DIAGNOSIS — Z7984 Long term (current) use of oral hypoglycemic drugs: Secondary | ICD-10-CM | POA: Diagnosis not present

## 2020-05-05 DIAGNOSIS — Z79899 Other long term (current) drug therapy: Secondary | ICD-10-CM | POA: Diagnosis not present

## 2020-05-05 LAB — URINALYSIS, ROUTINE W REFLEX MICROSCOPIC
Bilirubin Urine: NEGATIVE
Glucose, UA: 50 mg/dL — AB
Hgb urine dipstick: NEGATIVE
Ketones, ur: 5 mg/dL — AB
Nitrite: NEGATIVE
Protein, ur: NEGATIVE mg/dL
Specific Gravity, Urine: 1.017 (ref 1.005–1.030)
pH: 5 (ref 5.0–8.0)

## 2020-05-05 LAB — CBC WITH DIFFERENTIAL/PLATELET
Abs Immature Granulocytes: 0.04 10*3/uL (ref 0.00–0.07)
Basophils Absolute: 0.1 10*3/uL (ref 0.0–0.1)
Basophils Relative: 1 %
Eosinophils Absolute: 0.1 10*3/uL (ref 0.0–0.5)
Eosinophils Relative: 1 %
HCT: 38.6 % (ref 36.0–46.0)
Hemoglobin: 12.5 g/dL (ref 12.0–15.0)
Immature Granulocytes: 0 %
Lymphocytes Relative: 20 %
Lymphs Abs: 2.2 10*3/uL (ref 0.7–4.0)
MCH: 28.7 pg (ref 26.0–34.0)
MCHC: 32.4 g/dL (ref 30.0–36.0)
MCV: 88.5 fL (ref 80.0–100.0)
Monocytes Absolute: 1.1 10*3/uL — ABNORMAL HIGH (ref 0.1–1.0)
Monocytes Relative: 10 %
Neutro Abs: 7.4 10*3/uL (ref 1.7–7.7)
Neutrophils Relative %: 68 %
Platelets: 213 10*3/uL (ref 150–400)
RBC: 4.36 MIL/uL (ref 3.87–5.11)
RDW: 13.8 % (ref 11.5–15.5)
WBC: 10.9 10*3/uL — ABNORMAL HIGH (ref 4.0–10.5)
nRBC: 0 % (ref 0.0–0.2)

## 2020-05-05 LAB — BASIC METABOLIC PANEL
Anion gap: 10 (ref 5–15)
Anion gap: 6 (ref 5–15)
BUN: 54 mg/dL — ABNORMAL HIGH (ref 8–23)
BUN: 43 mg/dL — ABNORMAL HIGH (ref 8–23)
CO2: 23 mmol/L (ref 22–32)
CO2: 25 mmol/L (ref 22–32)
Calcium: 10.8 mg/dL — ABNORMAL HIGH (ref 8.9–10.3)
Calcium: 9.3 mg/dL (ref 8.9–10.3)
Chloride: 104 mmol/L (ref 98–111)
Chloride: 110 mmol/L (ref 98–111)
Creatinine, Ser: 1.35 mg/dL — ABNORMAL HIGH (ref 0.44–1.00)
Creatinine, Ser: 1.27 mg/dL — ABNORMAL HIGH (ref 0.44–1.00)
GFR calc Af Amer: 46 mL/min — ABNORMAL LOW (ref >60)
GFR calc Af Amer: 49 mL/min — ABNORMAL LOW (ref >60)
GFR calc non Af Amer: 39 mL/min — ABNORMAL LOW (ref >60)
GFR calc non Af Amer: 42 mL/min — ABNORMAL LOW (ref >60)
Glucose, Bld: 159 mg/dL — ABNORMAL HIGH (ref 70–99)
Glucose, Bld: 160 mg/dL — ABNORMAL HIGH (ref 70–99)
Potassium: 3.5 mmol/L (ref 3.5–5.1)
Potassium: 3.9 mmol/L (ref 3.5–5.1)
Sodium: 137 mmol/L (ref 135–145)
Sodium: 141 mmol/L (ref 135–145)

## 2020-05-05 LAB — PROTIME-INR
INR: 8.8 (ref 0.8–1.2)
INR: 1.7 — ABNORMAL HIGH (ref 0.8–1.2)
Prothrombin Time: 69.8 seconds — ABNORMAL HIGH (ref 11.4–15.2)
Prothrombin Time: 19 seconds — ABNORMAL HIGH (ref 11.4–15.2)

## 2020-05-05 LAB — HEPATIC FUNCTION PANEL
ALT: 15 U/L (ref 0–44)
AST: 17 U/L (ref 15–41)
Albumin: 3.1 g/dL — ABNORMAL LOW (ref 3.5–5.0)
Alkaline Phosphatase: 50 U/L (ref 38–126)
Bilirubin, Direct: 0.1 mg/dL (ref 0.0–0.2)
Indirect Bilirubin: 0.9 mg/dL (ref 0.3–0.9)
Total Bilirubin: 1 mg/dL (ref 0.3–1.2)
Total Protein: 6.2 g/dL — ABNORMAL LOW (ref 6.5–8.1)

## 2020-05-05 LAB — CBG MONITORING, ED
Glucose-Capillary: 168 mg/dL — ABNORMAL HIGH (ref 70–99)
Glucose-Capillary: 161 mg/dL — ABNORMAL HIGH (ref 70–99)

## 2020-05-05 LAB — TROPONIN I (HIGH SENSITIVITY): Troponin I (High Sensitivity): 36 ng/L — ABNORMAL HIGH (ref <18)

## 2020-05-05 LAB — GLUCOSE, CAPILLARY: Glucose-Capillary: 195 mg/dL — ABNORMAL HIGH (ref 70–99)

## 2020-05-05 LAB — PHOSPHORUS: Phosphorus: 3.5 mg/dL (ref 2.5–4.6)

## 2020-05-05 LAB — VITAMIN D 25 HYDROXY (VIT D DEFICIENCY, FRACTURES): Vit D, 25-Hydroxy: 72.63 ng/mL (ref 30–100)

## 2020-05-05 MED ORDER — SODIUM CHLORIDE 0.9 % IV BOLUS
1000.0000 mL | Freq: Once | INTRAVENOUS | Status: AC
  Administered 2020-05-05: 1000 mL via INTRAVENOUS

## 2020-05-05 MED ORDER — WARFARIN - PHARMACIST DOSING INPATIENT: Freq: Every day | Status: DC

## 2020-05-05 MED ORDER — METOPROLOL TARTRATE 50 MG PO TABS
50.0000 mg | ORAL_TABLET | Freq: Two times a day (BID) | ORAL | Status: DC
  Administered 2020-05-05 – 2020-05-10 (×10): 50 mg via ORAL
  Filled 2020-05-05 (×10): qty 1

## 2020-05-05 MED ORDER — LABETALOL HCL 5 MG/ML IV SOLN: 10.0000 mg | INTRAVENOUS | Status: DC | PRN

## 2020-05-05 MED ORDER — VITAMIN K1 10 MG/ML IJ SOLN
2.5000 mg | Freq: Once | INTRAVENOUS | Status: AC
  Administered 2020-05-05: 2.5 mg via INTRAVENOUS
  Filled 2020-05-05: qty 0.25

## 2020-05-05 MED ORDER — WARFARIN SODIUM 5 MG PO TABS
5.0000 mg | ORAL_TABLET | Freq: Once | ORAL | Status: AC
  Administered 2020-05-05: 5 mg via ORAL
  Filled 2020-05-05: qty 1

## 2020-05-05 NOTE — Progress Notes (Signed)
PT Cancellation Note  Patient Details Name: Rhonda Contreras MRN: [id number] DOB: 1951/05/10   Cancelled Treatment:    Reason Eval/Treat Not Completed: Medical issues which prohibited therapy; Patient with INR 8.8, will attempt to see once labs demonstrate improvement.  Will attempt next day.   Reginia Naas 05/05/2020, 3:07 PM  Magda Kiel, PT Acute Rehabilitation Services Pager:[phone] [phone] 05/05/2020

## 2020-05-05 NOTE — ED Notes (Signed)
Blue top tube redrawn and sent to lab that was missing.

## 2020-05-05 NOTE — ED Notes (Signed)
Pt A&Ox4, NAD, VSS. RR even and unlabored. Will continue to monitor

## 2020-05-05 NOTE — Progress Notes (Signed)
ANTICOAGULATION CONSULT NOTE - Initial Consult  Pharmacy Consult for Warfarin Indication: atrial fibrillation  Allergies  Allergen Reactions  . Tape Other (See Comments)    CERTAIN MEDICAL TAPES HURT AND BURN THE SKIN    Patient Measurements: Height: 4\' 6"  (137.2 cm) Weight: 59 kg (130 lb 1.1 oz) IBW/kg (Calculated) : 31.7  Vital Signs: Temp: 97.7 F (36.5 C) (07/12 1006) BP: 106/62 (07/12 1440) Pulse Rate: 72 (07/12 1440)  Labs: Recent Labs    05/04/20 1219 05/04/20 1754 05/04/20 2009 05/04/20 2217 05/05/20 0130 05/05/20 0742 05/05/20 1556  HGB 14.2  --   --   --   --  12.5  --   HCT 44.6  --   --   --   --  38.6  --   PLT 260  --   --   --   --  213  --   LABPROT  --  80.0*  --   --  69.8*  --  19.0*  INR  --  >10.0*  --   --  8.8*  --  1.7*  CREATININE 2.08*  --   --   --   --  1.35* 1.27*  TROPONINIHS 25*  --  32* 36*  --   --   --     Estimated Creatinine Clearance: 27.3 mL/min (A) (by C-G formula based on SCr of 1.27 mg/dL (H)).   Medical History: Past Medical History:  Diagnosis Date  . Cancer ([place])    skin  . Diabetes mellitus without complication ([place])   . Hypertension   . Immune deficiency disorder ([place])   . Lupus (systemic lupus erythematosus) ([place])   . Renal disorder    Stage 2 KD  . Stroke [facility])    2009    Medications:  Scheduled:  . atorvastatin  20 mg Oral Daily  . metoprolol tartrate  100 mg Oral BID  . tobramycin  1 drop Left Eye QID    Assessment: Patient is a 69yo female who was admitted on 05/04/20 with weakness, on warfarin PTA for Afib & found to have an elevated INR of >10. Patient received two 2.5mg  doses of IV vitamin K (one on 7/11 and one on 7/12).  PTA regimen: warfarin 2.5mg  daily except 5mg  on Mondays and Fridays  Most recent INR: 1.7. H/H & Plt wnl.  Goal of Therapy:  INR 2-3 Monitor platelets by anticoagulation protocol: Yes   Plan:  INR is subtherapeutic Give warfarin 5mg  tonight Vitamin K may have  activity for a few days, so vitamin K given recently may still inhibit warfarin's activity. May require dose slightly higher than normal. Monitor daily INR & CBC  Beckey Rutter 05/05/2020,4:54 PM

## 2020-05-05 NOTE — Progress Notes (Signed)
PROGRESS NOTE    YOCELINE BAZAR  PXT:[id number] DOB: Aug 02, 1951 DOA: 05/04/2020 PCP: Wenda Low, MD      Brief Narrative:  Mrs. Godsil is a 69 y.o. F with hx DM, HTN, CVA, lupus anticoagulant on warfarin, SLE not on DMARD, and CKD IIIa who presented with malaise and weakness for a few days.  The patient reports that recently while her sister was out of town, she "did not feel like eating", so stopped eating for a while.  When her sister returned, she found the patient weak and sluggish, and so she brought her to the ER.  In the ER, he was noted to have INR >10, creatinine 2 (from baseline 0.9), and and calcium 13.7.  She was given vitamin K, started on IV fluids in the hospital service were asked to evaluate.           Assessment & Plan:  Acute renal failure due to dehydration Urinalysis bland, improving with fluids but still not back to baseline. 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  Her chest x-ray and CT abdomen pelvis are unremarkable for mass.  Vitamin D normal. -Follow-up PTH -Check PTHrP  Supratherapeutic INR Unclear cause.  Patient denies taking excess of her medicine. (although low BP, elevated Ca and elevated INR suggest the opposite)  Vitamin K 5 mg IV already given unfortunately.  INR down already. -Resume warfarin -Follow up with PCP later this week -We will order home health RN and pillbox at discharge  SLE with Lupus anticoagulant Not on disease modifying treatment -Resume warfarin  Hypertension Cerebrovascular disease, secondary prevention -Hold amlodipine, metoprolol, lisinopril, HCTZ -Continue atorvastatin  Diabetes, type II, with nephropathy, non-insulin-dependent -Hold Metformin and glimepiride -Sliding scale corrections            Disposition: Status is: Inpatient  Remains inpatient appropriate  because:Her BUN and creatinine are both still quite elevated, and I believe she needs ongoing IV fluids   Dispo: The patient is from: Home              Anticipated d/c is to: Home              Anticipated d/c date is: 1 day              Patient currently is not medically stable to d/c.              MDM: The below labs and imaging reports were reviewed and summarized above.  Medication management as above.   DVT prophylaxis: SCDs Start: 05/04/20 2215  Code Status: FULL Family Communication: sister    Consultants:     Procedures:     Antimicrobials:      Culture data:              Subjective: The patient is feeling better, making urine.  No abdominal pain, vomiting, hematuria, confusion.  Objective: Vitals:   05/05/20 1058 05/05/20 1200 05/05/20 1440 05/05/20 1700  BP: 131/83 (!) 100/51 106/62 115/60  Pulse: 85 (!) 58 72 71  Resp: (!) 24 19 (!) 22 18  Temp:    98.6 F (37 C)  TempSrc:    Oral  SpO2: 96% 96% 96% 96%  Weight:      Height:        Intake/Output Summary (Last 24 hours) at 05/05/2020 1819 Last data filed at 05/04/2020 2046 Gross per 24 hour  Intake 550 ml  Output --  Net 550 ml   Filed Weights   05/04/20 1647  Weight: 59 kg    Examination: General appearance:  adult female, alert and in no acute distress.   HEENT: Anicteric, conjunctiva pink, lids and lashes normal. No nasal deformity, discharge, epistaxis.  Lips moist.   Skin: Warm and dry.  No jaundice.  No suspicious rashes or lesions. Cardiac: RRR, nl S1-S2, no murmurs appreciated.  Capillary refill is brisk.  JVP normal.  No LE edema.  Radial pulses 2+ and symmetric. Respiratory: Normal respiratory rate and rhythm.  CTAB without rales or wheezes. Abdomen: Abdomen soft.  No TTP or guarding. No ascites, distension, hepatosplenomegaly.   MSK: No deformities or effusions. Neuro: Awake and alert.  EOMI, moves all extremities. Speech fluent.    Psych: Sensorium intact  and responding to questions, attention normal. Affect normal.  Judgment and insight appear normal.    Data Reviewed: I have personally reviewed following labs and imaging studies:  CBC: Recent Labs  Lab 05/04/20 1219 05/05/20 0742  WBC 12.4* 10.9*  NEUTROABS  --  7.4  HGB 14.2 12.5  HCT 44.6 38.6  MCV 89.2 88.5  PLT 260 433   Basic Metabolic Panel: Recent Labs  Lab 05/04/20 1219 05/04/20 2217 05/05/20 0742 05/05/20 1556  NA 137  --  137 141  K 4.0  --  3.5 3.9  CL 97*  --  104 110  CO2 24  --  23 25  GLUCOSE 165*  --  159* 160*  BUN 71*  --  54* 43*  CREATININE 2.08*  --  1.35* 1.27*  CALCIUM 13.7*  --  10.8* 9.3  MG 1.8  --   --   --   PHOS  --  3.5  --   --    GFR: Estimated Creatinine Clearance: 27.3 mL/min (A) (by C-G formula based on SCr of 1.27 mg/dL (H)). Liver Function Tests: Recent Labs  Lab 05/04/20 1219 05/05/20 0742  AST 23 17  ALT 16 15  ALKPHOS 56 50  BILITOT 0.8 1.0  PROT 7.7 6.2*  ALBUMIN 3.6 3.1*   Recent Labs  Lab 05/04/20 1219  LIPASE 60*   No results for input(s): AMMONIA in the last 168 hours. Coagulation Profile: Recent Labs  Lab 05/04/20 1754 05/05/20 0130 05/05/20 1556  INR >10.0* 8.8* 1.7*   Cardiac Enzymes: No results for input(s): CKTOTAL, CKMB, CKMBINDEX, TROPONINI in the last 168 hours. BNP (last 3 results) No results for input(s): PROBNP in the last 8760 hours. HbA1C: No results for input(s): HGBA1C in the last 72 hours. CBG: Recent Labs  Lab 05/04/20 2137 05/04/20 2150 05/04/20 2356 05/05/20 0439 05/05/20 0831  GLUCAP 87 93 211* 168* 161*   Lipid Profile: No results for input(s): CHOL, HDL, LDLCALC, TRIG, CHOLHDL, LDLDIRECT in the last 72 hours. Thyroid Function Tests: Recent Labs    05/04/20 1754  TSH 0.064*   Anemia Panel: No results for input(s): VITAMINB12, FOLATE, FERRITIN, TIBC, IRON, RETICCTPCT in the last 72 hours. Urine analysis:    Component Value Date/Time   COLORURINE YELLOW  05/05/2020 0307   APPEARANCEUR CLEAR 05/05/2020 0307   LABSPEC 1.017 05/05/2020 0307   PHURINE 5.0 05/05/2020 0307   GLUCOSEU 50 (A) 05/05/2020 0307   HGBUR NEGATIVE 05/05/2020 0307   BILIRUBINUR NEGATIVE 05/05/2020 0307   KETONESUR 5 (A) 05/05/2020 0307   PROTEINUR NEGATIVE 05/05/2020 0307   UROBILINOGEN 1.0 05/16/2008 0330   NITRITE NEGATIVE 05/05/2020 0307   LEUKOCYTESUR SMALL (A)  05/05/2020 0307   Sepsis Labs: @LABRCNTIP (procalcitonin:4,lacticacidven:4)  ) Recent Results (from the past 240 hour(s))  SARS Coronavirus 2 by RT PCR (hospital order, performed in [facility] hospital lab) Nasopharyngeal Nasopharyngeal Swab     Status: None   Collection Time: 05/04/20  4:33 PM   Specimen: Nasopharyngeal Swab  Result Value Ref Range Status   SARS Coronavirus 2 NEGATIVE NEGATIVE Final    Comment: (NOTE) SARS-CoV-2 target nucleic acids are NOT DETECTED.  The SARS-CoV-2 RNA is generally detectable in upper and lower respiratory specimens during the acute phase of infection. The lowest concentration of SARS-CoV-2 viral copies this assay can detect is 250 copies / mL. A negative result does not preclude SARS-CoV-2 infection and should not be used as the sole basis for treatment or other patient management decisions.  A negative result may occur with improper specimen collection / handling, submission of specimen other than nasopharyngeal swab, presence of viral mutation(s) within the areas targeted by this assay, and inadequate number of viral copies (<250 copies / mL). A negative result must be combined with clinical observations, patient history, and epidemiological information.  Fact Sheet for Patients:   StrictlyIdeas.no  Fact Sheet for Healthcare Providers: BankingDealers.co.za  This test is not yet approved or  cleared by the [place] FDA and has been authorized for detection and/or diagnosis of SARS-CoV-2 by FDA under an  Emergency Use Authorization (EUA).  This EUA will remain in effect (meaning this test can be used) for the duration of the COVID-19 declaration under Section 564(b)(1) of the Act, 21 U.S.C. section 360bbb-3(b)(1), unless the authorization is terminated or revoked sooner.  Performed at [facility], [place] [address], [place], [place] [address]          Radiology Studies: CT Head Wo Contrast  Result Date: 05/04/2020 CLINICAL DATA:  Focal neuro deficit.  Generalized weakness. EXAM: CT HEAD WITHOUT CONTRAST TECHNIQUE: Contiguous axial images were obtained from the base of the skull through the vertex without intravenous contrast. COMPARISON:  May 18, 2008 FINDINGS: Brain: No subdural, epidural, or subarachnoid hemorrhage. Moderate white matter changes are identified. Lacunar infarcts are seen in the right caudate head, right thalamus, the left thalamus, unchanged since 2009. There is a small right frontal infarct seen on coronal image 29. There is a small left parietal infarct on axial image 21 not seen in 2009. No other infarcts identified. No acute ischemia noted. Ventricles and sulci are unremarkable. Lacunar infarcts are again seen in the bilateral cerebellar hemispheres, nonacute in appearance. Brainstem is normal. Basal cisterns are unremarkable. No mass effect or midline shift. Vascular: Calcified atherosclerosis is seen in the intracranial carotids Skull: Normal. Negative for fracture or focal lesion. Sinuses/Orbits: No acute finding. Other: None. IMPRESSION: 1. Small infarct in the right frontal lobe, favored to be nonacute. Small infarct in the left parietal lobe, also favored to be nonacute. Neither of these infarcts were seen in 2009. Moderate white matter changes and scattered lacunar infarcts in the basal ganglia and cerebellum are stable since 2009. No definitive acute intracranial abnormality identified on this study. Electronically Signed   By: Dorise Bullion III M.D   On:  05/04/2020 17:09   DG Chest Portable 1 View  Result Date: 05/04/2020 CLINICAL DATA:  Altered level of consciousness, generalized weakness, anorexia EXAM: PORTABLE CHEST 1 VIEW COMPARISON:  02/01/2018 FINDINGS: The heart size and mediastinal contours are within normal limits. Both lungs are clear. The visualized skeletal structures are unremarkable. IMPRESSION: No active disease. Electronically  Signed   By: Randa Ngo M.D.   On: 05/04/2020 17:41   CT RENAL STONE STUDY  Result Date: 05/05/2020 CLINICAL DATA:  Flank pain.  Evaluate for nephrolithiasis. EXAM: CT ABDOMEN AND PELVIS WITHOUT CONTRAST TECHNIQUE: Multidetector CT imaging of the abdomen and pelvis was performed following the standard protocol without IV contrast. COMPARISON:  None. FINDINGS: The lack of intravenous contrast limits the ability to evaluate solid abdominal organs. Lower chest: Limited visualization of the lower thorax demonstrates minimal bibasilar subsegmental atelectasis. No pleural effusion. Hepatobiliary: Normal hepatic contour. There is a punctate (3 mm) radiopaque gallstone within otherwise normal-appearing gallbladder. No gallbladder wall thickening or pericholecystic stranding. No ascites. Pancreas: Normal noncontrast appearance of the pancreas. Spleen: Normal noncontrast appearance of the spleen. Adrenals/Urinary Tract: There is a punctate (approximately 0.6 cm) hypoattenuating lesion involving the mid aspect the left kidney (axial image 23, series 3; coronal image 59, series 6), too small to adequately characterize though favored to represent a renal cyst. Calcifications about the right renal hila are favored to be vascular in etiology. No evidence of nephrolithiasis. No renal stones are seen along expected course of either ureter or the urinary bladder. Normal noncontrast appearance of the urinary bladder given degree of distention. No urinary obstruction or perinephric stranding. Normal noncontrast appearance of the  bilateral adrenal glands. Stomach/Bowel: Small hiatal hernia. There is mild circumferential bowel wall thickening involving the proximal descending colon (axial images 13 and 17, series 6; coronal images 40 and 42, series 6) as well as the sigmoid colon within the left lower abdomen/pelvis (image 56, series 3), not resulting in enteric obstruction. No associated mesenteric stranding. Normal noncontrast appearance of the terminal ileum. The appendix is not identified however there is no pericecal inflammatory change. No pneumoperitoneum, pneumatosis or portal venous gas. Vascular/Lymphatic: Moderate amount of atherosclerotic plaque within normal caliber abdominal aorta. No bulky retroperitoneal, mesenteric, pelvic or inguinal lymphadenopathy on this noncontrast examination. Reproductive: Dystrophic calcified fibroid measuring approximately 0.7 cm (sagittal image 62, series 7) within an otherwise expectedly atrophic uterus. No discrete adnexal lesions. No free fluid the pelvic cul-de-sac. Other: Tiny mesenteric fat containing periumbilical hernia. Musculoskeletal: No acute or aggressive osseous abnormalities. Severe DDD of T11-T12 with disc space height loss, endplate irregularity and sclerosis. Post intramedullary rod fixation of the left femur, incompletely evaluated. IMPRESSION: 1. Mild circumferential bowel wall thickening involving the proximal descending colon as well as the sigmoid colon within the left lower abdomen/pelvis, not resulting in enteric obstruction, potentially artifactual due to underdistention and or peristalsis though conceivably an enteritis could have a similar appearance. 2. Otherwise, no explanation for patient's flank pain. Specifically, no evidence of nephrolithiasis or urinary obstruction. 3. Cholelithiasis without evidence of cholecystitis on this noncontrast examination. 4. Small hiatal hernia. 5.  Aortic Atherosclerosis (ICD10-I70.0). Electronically Signed   By: Sandi Mariscal M.D.   On:  05/05/2020 07:41        Scheduled Meds: . atorvastatin  20 mg Oral Daily  . metoprolol tartrate  100 mg Oral BID  . tobramycin  1 drop Left Eye QID  . [START ON 05/06/2020] Warfarin - Pharmacist Dosing Inpatient   Does not apply q1600   Continuous Infusions: . sodium chloride 100 mL/hr at 05/05/20 1717     LOS: 0 days    Time spent: 25 minutes    Edwin Dada, MD [facility] 05/05/2020, 6:19 PM     Please page though [place] or Epic secure chat:  For [employer], Adult nurse

## 2020-05-05 NOTE — ED Notes (Signed)
Report called to Trish RN

## 2020-05-05 NOTE — Plan of Care (Signed)
  Problem: Activity: Goal: Risk for activity intolerance will decrease Outcome: Progressing   

## 2020-05-05 NOTE — ED Notes (Signed)
Pt transported to CT via stretcher.  

## 2020-05-06 LAB — RENAL FUNCTION PANEL
Albumin: 3 g/dL — ABNORMAL LOW (ref 3.5–5.0)
Anion gap: 10 (ref 5–15)
BUN: 23 mg/dL (ref 8–23)
CO2: 22 mmol/L (ref 22–32)
Calcium: 8.9 mg/dL (ref 8.9–10.3)
Chloride: 109 mmol/L (ref 98–111)
Creatinine, Ser: 0.94 mg/dL (ref 0.44–1.00)
GFR calc Af Amer: >60 mL/min (ref >60)
GFR calc non Af Amer: >60 mL/min (ref >60)
Glucose, Bld: 133 mg/dL — ABNORMAL HIGH (ref 70–99)
Phosphorus: 1.6 mg/dL — ABNORMAL LOW (ref 2.5–4.6)
Potassium: 3.5 mmol/L (ref 3.5–5.1)
Sodium: 141 mmol/L (ref 135–145)

## 2020-05-06 LAB — GLUCOSE, CAPILLARY
Glucose-Capillary: 114 mg/dL — ABNORMAL HIGH (ref 70–99)
Glucose-Capillary: 132 mg/dL — ABNORMAL HIGH (ref 70–99)
Glucose-Capillary: 167 mg/dL — ABNORMAL HIGH (ref 70–99)
Glucose-Capillary: 170 mg/dL — ABNORMAL HIGH (ref 70–99)
Glucose-Capillary: 187 mg/dL — ABNORMAL HIGH (ref 70–99)
Glucose-Capillary: 207 mg/dL — ABNORMAL HIGH (ref 70–99)
Glucose-Capillary: 221 mg/dL — ABNORMAL HIGH (ref 70–99)

## 2020-05-06 LAB — PTH, INTACT AND CALCIUM
Calcium, Total (PTH): 11.2 mg/dL — ABNORMAL HIGH (ref 8.7–10.3)
PTH: 7 pg/mL — ABNORMAL LOW (ref 15–65)

## 2020-05-06 LAB — PROTIME-INR
INR: 1.3 — ABNORMAL HIGH (ref 0.8–1.2)
Prothrombin Time: 15.9 seconds — ABNORMAL HIGH (ref 11.4–15.2)

## 2020-05-06 MED ORDER — ENOXAPARIN SODIUM 60 MG/0.6ML ~~LOC~~ SOLN
60.0000 mg | Freq: Two times a day (BID) | SUBCUTANEOUS | Status: DC
  Administered 2020-05-06 – 2020-05-10 (×9): 60 mg via SUBCUTANEOUS
  Filled 2020-05-06 (×9): qty 0.6

## 2020-05-06 MED ORDER — WARFARIN SODIUM 5 MG PO TABS
5.0000 mg | ORAL_TABLET | Freq: Once | ORAL | Status: AC
  Administered 2020-05-06: 5 mg via ORAL
  Filled 2020-05-06: qty 1

## 2020-05-06 NOTE — Plan of Care (Signed)
  Problem: Education: Goal: Knowledge of General Education information will improve Description Including pain rating scale, medication(s)/side effects and non-pharmacologic comfort measures Outcome: Progressing   

## 2020-05-06 NOTE — Progress Notes (Signed)
PROGRESS NOTE    Rhonda Contreras  ALP:[id number] DOB: 12/04/1950 DOA: 05/04/2020 PCP: Wenda Low, MD      Brief Narrative:  Rhonda Contreras is a 69 y.o. F with hx DM, HTN, CVA, lupus anticoagulant on warfarin, and CKD IIIa who presented with malaise and weakness for a few days.  The patient reports that recently while her sister was out of town, she "did not feel like eating", so stopped eating for a while.  When her sister returned, she found the patient weak and sluggish, and so she brought her to the ER.  In the ER, she was noted to have INR >10, creatinine 2 (from baseline 0.9), and and calcium 13.7.  She was given vitamin K, started on IV fluids in the hospital service were asked to evaluate.           Assessment & Plan:  Acute renal failure due to dehydration Resolved with fluids   Dehydration due to hypercalcemia Hypercalcemia Resolved with fluids. PTH appropriately low, Vit D normal.   Suspect milk-alkali. -Follow PTHrP  Supratherapeutic INR Lupus anticoagulant on chronic warfarin Acquired thrombophilia Lupus ac diagnosed at time of premature stroke age 89 in 2009, on warfarin since.  Unclear cause of supratherapeutic INR now.  Patient denies taking excess of her medicine. (although low BP, elevated Ca and elevated INR suggest otherwise)  Vitamin K 5 mg IV given in ER.  INR 1.3 this morning.  Warfarin resumed last night.   -Continue warfarin -Start Lovenox bridge -Discussed with pharmacy 60 mg of Lovenox twice daily or 80-90 mg of Lovenox once daily are both reasonable bridging regimens -We will order home health RN and pillbox at discharge   Hypertension Cerebrovascular disease, secondary prevention BP soft -Hold amlodipine, metoprolol, lisinopril, HCTZ -Continue atorvastatin  Type 2 diabetes, with nephropathy, non-insulin-dependent Glucoses controlled -Hold Metformin and glimepiride -Continue sliding scale correction insulin             Disposition:    Dispo:  Patient From: Home  Planned Disposition: To be determined  Expected discharge date: Uncertain  Medically stable for discharge: Yes   The patient's renal function and hypercalcemia resolved with fluids.  Unfortunately her INR was overcorrected.  In the setting of lupus anticoagulant I recommend bridging therapy.    Since the patient has no thumb on her left hand, residual hemiparesis in her right side from an old stroke, she cannot give herself Lovenox shots.  Her sister refuses to help administer Lovenox at home.  Case management are performing a diligent search for alternative arrangements to administer the patient once daily Lovenox injections, but at present this has been fruitless.  Anticipate she will need to remain in the hospital until her INR is therapeutic.                MDM: The below labs and imaging reports were reviewed and summarized above.  Medication management as above.   DVT prophylaxis: SCDs Start: 05/04/20 2215  Code Status: FULL Family Communication: sister    Consultants:     Procedures:     Antimicrobials:      Culture data:              Subjective: Patient feels her normal self.  No confusion, bleeding, malaise.  No dizziness, weakness.  Objective: Vitals:   05/06/20 0430 05/06/20 0911 05/06/20 1613 05/06/20 2022  BP: 137/64 129/64 126/69 133/70  Pulse: 71 80 81 81  Resp: 18 18 18 18   Temp: 98.5  F (36.9 C) 97.6 F (36.4 C) 97.6 F (36.4 C) 98.4 F (36.9 C)  TempSrc: Oral Oral Oral Oral  SpO2: 95% 94% 93% 95%  Weight: 59.1 kg     Height:        Intake/Output Summary (Last 24 hours) at 05/06/2020 2153 Last data filed at 05/06/2020 1700 Gross per 24 hour  Intake 1320 ml  Output 1400 ml  Net -80 ml   Filed Weights   05/04/20 1647 05/06/20 0430  Weight: 59 kg 59.1 kg    Examination: General appearance: Adult female, lying in bed, no acute distress HEENT:  Anicteric, left conjunctive are injected, lids lashes normal.  No nasal deformity, discharge, or epistaxis. Skin: Skin warm and dry, ecchymosis on the left arm, otherwise no suspicious rashes or lesions Cardiac: RRR, no murmurs, no lower extremity edema. Respiratory: Normal respiratory rate and rhythm, lungs clear without rales or wheezes. Abdomen: Abdomen soft without tenderness palpation or guarding MSK: Congenital deformities noted Neuro: Awake and alert, extraocular movements intact, moves all extremities with normal strength and coordination, speech fluent.    Psych: Sensorium intact responding to questions, attention normal, affect normal, judgment insight appear normal.    Data Reviewed: I have personally reviewed following labs and imaging studies:  CBC: Recent Labs  Lab 05/04/20 1219 05/05/20 0742  WBC 12.4* 10.9*  NEUTROABS  --  7.4  HGB 14.2 12.5  HCT 44.6 38.6  MCV 89.2 88.5  PLT 260 812   Basic Metabolic Panel: Recent Labs  Lab 05/04/20 1219 05/04/20 2217 05/05/20 0742 05/05/20 1556 05/06/20 0623  NA 137  --  137 141 141  K 4.0  --  3.5 3.9 3.5  CL 97*  --  104 110 109  CO2 24  --  23 25 22   GLUCOSE 165*  --  159* 160* 133*  BUN 71*  --  54* 43* 23  CREATININE 2.08*  --  1.35* 1.27* 0.94  CALCIUM 13.7* 11.2* 10.8* 9.3 8.9  MG 1.8  --   --   --   --   PHOS  --  3.5  --   --  1.6*   GFR: Estimated Creatinine Clearance: 37 mL/min (by C-G formula based on SCr of 0.94 mg/dL). Liver Function Tests: Recent Labs  Lab 05/04/20 1219 05/05/20 0742 05/06/20 0623  AST 23 17  --   ALT 16 15  --   ALKPHOS 56 50  --   BILITOT 0.8 1.0  --   PROT 7.7 6.2*  --   ALBUMIN 3.6 3.1* 3.0*   Recent Labs  Lab 05/04/20 1219  LIPASE 60*   No results for input(s): AMMONIA in the last 168 hours. Coagulation Profile: Recent Labs  Lab 05/04/20 1754 05/05/20 0130 05/05/20 1556 05/06/20 0623  INR >10.0* 8.8* 1.7* 1.3*   Cardiac Enzymes: No results for input(s):  CKTOTAL, CKMB, CKMBINDEX, TROPONINI in the last 168 hours. BNP (last 3 results) No results for input(s): PROBNP in the last 8760 hours. HbA1C: No results for input(s): HGBA1C in the last 72 hours. CBG: Recent Labs  Lab 05/06/20 0431 05/06/20 0715 05/06/20 1112 05/06/20 1609 05/06/20 2023  GLUCAP 132* 114* 207* 167* 221*   Lipid Profile: No results for input(s): CHOL, HDL, LDLCALC, TRIG, CHOLHDL, LDLDIRECT in the last 72 hours. Thyroid Function Tests: Recent Labs    05/04/20 1754  TSH 0.064*   Anemia Panel: No results for input(s): VITAMINB12, FOLATE, FERRITIN, TIBC, IRON, RETICCTPCT in the last 72 hours. Urine analysis:  Component Value Date/Time   COLORURINE YELLOW 05/05/2020 0307   APPEARANCEUR CLEAR 05/05/2020 0307   LABSPEC 1.017 05/05/2020 0307   PHURINE 5.0 05/05/2020 0307   GLUCOSEU 50 (A) 05/05/2020 0307   HGBUR NEGATIVE 05/05/2020 0307   BILIRUBINUR NEGATIVE 05/05/2020 0307   KETONESUR 5 (A) 05/05/2020 0307   PROTEINUR NEGATIVE 05/05/2020 0307   UROBILINOGEN 1.0 05/16/2008 0330   NITRITE NEGATIVE 05/05/2020 0307   LEUKOCYTESUR SMALL (A) 05/05/2020 0307   Sepsis Labs: @LABRCNTIP (procalcitonin:4,lacticacidven:4)  ) Recent Results (from the past 240 hour(s))  SARS Coronavirus 2 by RT PCR (hospital order, performed in [facility] hospital lab) Nasopharyngeal Nasopharyngeal Swab     Status: None   Collection Time: 05/04/20  4:33 PM   Specimen: Nasopharyngeal Swab  Result Value Ref Range Status   SARS Coronavirus 2 NEGATIVE NEGATIVE Final    Comment: (NOTE) SARS-CoV-2 target nucleic acids are NOT DETECTED.  The SARS-CoV-2 RNA is generally detectable in upper and lower respiratory specimens during the acute phase of infection. The lowest concentration of SARS-CoV-2 viral copies this assay can detect is 250 copies / mL. A negative result does not preclude SARS-CoV-2 infection and should not be used as the sole basis for treatment or other patient  management decisions.  A negative result may occur with improper specimen collection / handling, submission of specimen other than nasopharyngeal swab, presence of viral mutation(s) within the areas targeted by this assay, and inadequate number of viral copies (<250 copies / mL). A negative result must be combined with clinical observations, patient history, and epidemiological information.  Fact Sheet for Patients:   StrictlyIdeas.no  Fact Sheet for Healthcare Providers: BankingDealers.co.za  This test is not yet approved or  cleared by the [place] FDA and has been authorized for detection and/or diagnosis of SARS-CoV-2 by FDA under an Emergency Use Authorization (EUA).  This EUA will remain in effect (meaning this test can be used) for the duration of the COVID-19 declaration under Section 564(b)(1) of the Act, 21 U.S.C. section 360bbb-3(b)(1), unless the authorization is terminated or revoked sooner.  Performed at [facility], [place] [address] [place], [place] [address]          Radiology Studies: CT RENAL STONE STUDY  Result Date: 05/05/2020 CLINICAL DATA:  Flank pain.  Evaluate for nephrolithiasis. EXAM: CT ABDOMEN AND PELVIS WITHOUT CONTRAST TECHNIQUE: Multidetector CT imaging of the abdomen and pelvis was performed following the standard protocol without IV contrast. COMPARISON:  None. FINDINGS: The lack of intravenous contrast limits the ability to evaluate solid abdominal organs. Lower chest: Limited visualization of the lower thorax demonstrates minimal bibasilar subsegmental atelectasis. No pleural effusion. Hepatobiliary: Normal hepatic contour. There is a punctate (3 mm) radiopaque gallstone within otherwise normal-appearing gallbladder. No gallbladder wall thickening or pericholecystic stranding. No ascites. Pancreas: Normal noncontrast appearance of the pancreas. Spleen: Normal noncontrast appearance of the  spleen. Adrenals/Urinary Tract: There is a punctate (approximately 0.6 cm) hypoattenuating lesion involving the mid aspect the left kidney (axial image 23, series 3; coronal image 59, series 6), too small to adequately characterize though favored to represent a renal cyst. Calcifications about the right renal hila are favored to be vascular in etiology. No evidence of nephrolithiasis. No renal stones are seen along expected course of either ureter or the urinary bladder. Normal noncontrast appearance of the urinary bladder given degree of distention. No urinary obstruction or perinephric stranding. Normal noncontrast appearance of the bilateral adrenal glands. Stomach/Bowel: Small hiatal hernia. There is mild circumferential  bowel wall thickening involving the proximal descending colon (axial images 13 and 17, series 6; coronal images 40 and 42, series 6) as well as the sigmoid colon within the left lower abdomen/pelvis (image 56, series 3), not resulting in enteric obstruction. No associated mesenteric stranding. Normal noncontrast appearance of the terminal ileum. The appendix is not identified however there is no pericecal inflammatory change. No pneumoperitoneum, pneumatosis or portal venous gas. Vascular/Lymphatic: Moderate amount of atherosclerotic plaque within normal caliber abdominal aorta. No bulky retroperitoneal, mesenteric, pelvic or inguinal lymphadenopathy on this noncontrast examination. Reproductive: Dystrophic calcified fibroid measuring approximately 0.7 cm (sagittal image 62, series 7) within an otherwise expectedly atrophic uterus. No discrete adnexal lesions. No free fluid the pelvic cul-de-sac. Other: Tiny mesenteric fat containing periumbilical hernia. Musculoskeletal: No acute or aggressive osseous abnormalities. Severe DDD of T11-T12 with disc space height loss, endplate irregularity and sclerosis. Post intramedullary rod fixation of the left femur, incompletely evaluated. IMPRESSION: 1.  Mild circumferential bowel wall thickening involving the proximal descending colon as well as the sigmoid colon within the left lower abdomen/pelvis, not resulting in enteric obstruction, potentially artifactual due to underdistention and or peristalsis though conceivably an enteritis could have a similar appearance. 2. Otherwise, no explanation for patient's flank pain. Specifically, no evidence of nephrolithiasis or urinary obstruction. 3. Cholelithiasis without evidence of cholecystitis on this noncontrast examination. 4. Small hiatal hernia. 5.  Aortic Atherosclerosis (ICD10-I70.0). Electronically Signed   By: Sandi Mariscal M.D.   On: 05/05/2020 07:41        Scheduled Meds: . atorvastatin  20 mg Oral Daily  . enoxaparin (LOVENOX) injection  60 mg Subcutaneous Q12H  . metoprolol tartrate  50 mg Oral BID  . tobramycin  1 drop Left Eye QID  . Warfarin - Pharmacist Dosing Inpatient   Does not apply q1600   Continuous Infusions:    LOS: 1 day    Time spent: 72 minutes    Edwin Dada, MD [facility] 05/06/2020, 9:53 PM     Please page though [place] or Epic secure chat:  For [employer], Adult nurse

## 2020-05-06 NOTE — Evaluation (Signed)
Physical Therapy Evaluation Patient Details Name: Rhonda Contreras MRN: [id number] DOB: 02/01/1951 Today's Date: 05/06/2020   History of Present Illness  69 yo admitted with weakness and malaise who admits to not eating while sister was out of town. Pt with acute renal failure due to dehydration with hypercalcemia and elevated INR. PMHx: DM, HTN, CVA with residual Rt weakness, CKD, Lupus  Clinical Impression  Pt pleasant reporting she feels much better than on admission. Pt states she was somewhat depressed and wasn't trying to get herself in physical distress by not eating. Pt cares for herself at home and sister checks in on her . Pt reports no falls and that she is near her baseline but reports she doesn't move much and needs more encouragement. Pt with decreased functional mobility, balance and independence who will benefit from acute therapy and HHPT to increase stability and safety.     Follow Up Recommendations Home health PT    Equipment Recommendations  None recommended by PT    Recommendations for Other Services       Precautions / Restrictions Precautions Precautions: Fall      Mobility  Bed Mobility Overal bed mobility: Independent                Transfers Overall transfer level: Independent                  Ambulation/Gait Ambulation/Gait assistance: Modified independent (Device/Increase time) Gait Distance (Feet): 400 Feet Assistive device: None Gait Pattern/deviations: Step-through pattern;Decreased stride length   Gait velocity interpretation: >2.62 ft/sec, indicative of community ambulatory General Gait Details: pt with good stability with straight hall ambulation with decreased speed  Stairs            Wheelchair Mobility    Modified Rankin (Stroke Patients Only)       Balance Overall balance assessment: Mild deficits observed, not formally tested                                           Pertinent  Vitals/Pain Pain Assessment: No/denies pain    Home Living Family/patient expects to be discharged to:: Private residence Living Arrangements: Alone Available Help at Discharge: Family;Available PRN/intermittently Type of Home: Apartment Home Access: Level entry     Home Layout: One level Home Equipment: Cane - single point      Prior Function Level of Independence: Independent         Comments: pt drives to the store, walks her dog and only uses cane on rare occasions. reports she does not walk much     Hand Dominance        Extremity/Trunk Assessment   Upper Extremity Assessment Upper Extremity Assessment: Generalized weakness    Lower Extremity Assessment Lower Extremity Assessment: Generalized weakness    Cervical / Trunk Assessment Cervical / Trunk Assessment: Normal  Communication   Communication: No difficulties  Cognition Arousal/Alertness: Awake/alert Behavior During Therapy: WFL for tasks assessed/performed Overall Cognitive Status: Within Functional Limits for tasks assessed                                        General Comments      Exercises     Assessment/Plan    PT Assessment Patient needs continued PT services  PT Problem List  Decreased mobility;Decreased activity tolerance;Decreased balance       PT Treatment Interventions Gait training;Balance training;Functional mobility training;Therapeutic activities;Patient/family education;DME instruction;Therapeutic exercise    PT Goals (Current goals can be found in the Care Plan section)  Acute Rehab PT Goals Patient Stated Goal: return home PT Goal Formulation: With patient Time For Goal Achievement: 05/20/20 Potential to Achieve Goals: Good    Frequency Min 3X/week   Barriers to discharge Decreased caregiver support      Co-evaluation               AM-PAC PT "6 Clicks" Mobility  Outcome Measure Help needed turning from your back to your side while in a  flat bed without using bedrails?: None Help needed moving from lying on your back to sitting on the side of a flat bed without using bedrails?: None Help needed moving to and from a bed to a chair (including a wheelchair)?: None Help needed standing up from a chair using your arms (e.g., wheelchair or bedside chair)?: None Help needed to walk in hospital room?: A Little Help needed climbing 3-5 steps with a railing? : A Little 6 Click Score: 22    End of Session Equipment Utilized During Treatment: Gait belt Activity Tolerance: Patient tolerated treatment well Patient left: in chair;with call bell/phone within reach Nurse Communication: Mobility status PT Visit Diagnosis: Other abnormalities of gait and mobility (R26.89);Muscle weakness (generalized) (M62.81)    Time: 9937-1696 PT Time Calculation (min) (ACUTE ONLY): 12 min   Charges:   PT Evaluation $PT Eval Moderate Complexity: 1 Mod          Delcie Ruppert P, PT Acute Rehabilitation Services Pager: [phone] Office: [phone]   Sandy Salaam Kaila Devries 05/06/2020, 12:37 PM

## 2020-05-06 NOTE — TOC Benefit Eligibility Note (Signed)
Transition of Care (TOC) Benefit Eligibility Note    Patient Details  Name: Rhonda Contreras MRN: [id number] Date of Birth: 07/01/1951   Medication/Dose: Enoxaparin 60mg.  Covered?: Yes  Tier:  (Tier 4)  Prescription Coverage [facility]  Spoke with Person/Company/Phone Number:: Jan G. W[employer] [facility] PH# [phone]  Co-Pay: $249.49 before deductible met,after deductible met $100.00 for 30 day supply bid.  Prior Approval: No  Deductible: Unmet       Hawkins, Ollie Smith Phone Number: 05/06/2020, 9:58 AM     

## 2020-05-06 NOTE — TOC Progression Note (Addendum)
Transition of Care [facility]) - Progression Note    Patient Details  Name: MARICA TRENTHAM MRN: [id number] Date of Birth: 1951/05/09  Transition of Care [facility]) CM/SW Contact  Bartholomew Crews, RN Phone Number: [phone] 05/06/2020, 3:37 PM  Clinical Narrative:     Referral sent to [facility] to inquire about assistance with administering lovenox injections. Referral still pending at this time. Update: Referral declined - patient not meeting their requirements for follow up.  Spoke with [employer] to inquire about additional resources to assist patient. No additional benefit or resources available outside of home health which is already arranged.   Spoke with PCP office - recommend that patient remain inpatient if unable to arrange daily lovenox injections.   Acknowledging MD question. [place] don't do home visits.   TOC following for transition needs.   Expected Discharge Plan: [place] Barriers to Discharge: No Barriers Identified  Expected Discharge Plan and Services Expected Discharge Plan: [place] In-house Referral: [facility] Discharge Planning Services: CM Consult Post Acute Care Choice: [place] arrangements for the past 2 months: Single Family Home Expected Discharge Date: 05/06/20               DME Arranged: N/A DME Agency: NA       HH Arranged: RN [place] Agency: [facility] Date [place] Agency Contacted: 05/06/20 Time [place]: 1008 Representative spoke with at [place]: [place] ([place]) Interventions    Readmission Risk Interventions No flowsheet data found.

## 2020-05-06 NOTE — TOC Initial Note (Signed)
Transition of Care [facility]) - Initial/Assessment Note    Patient Details  Name: Rhonda Contreras MRN: [id number] Date of Birth: April 18, 1951  Transition of Care [facility]) CM/SW Contact:    Bartholomew Crews, RN Phone Number: [phone] 05/06/2020, 10:08 AM  Clinical Narrative:                  Spoke with patient at the bedside. PTA home alone. Sister checks in with her. Niece assists with [place] as needed. States her sister will provide transportation home.   Discussed self administration of lovenox. Patient verbalized that it makes her nervous, however, she is willing to learn. Agreeable to sister being taught in order to be more supportive at home. Spoke with bedside nurse about lovenox administration teaching with patient and sister prior to discharge.   Discussed HH RN. Patient agreeable. Offered choice of agencies. [facility] RN. MD has already provided [facility] with [facility]. [facility] referral placed to assist with ongoing medication management.   TOC following for transition needs.   Expected Discharge Plan: [place] Barriers to Discharge: No Barriers Identified   Patient Goals and CMS Choice Patient states their goals for this hospitalization and ongoing recovery are:: return home CMS Medicare.gov Compare Post Acute Care list provided to:: Patient Choice offered to / list presented to : Patient  Expected Discharge Plan and Services Expected Discharge Plan: [place] In-house Referral: [facility] Discharge Planning Services: CM Consult Post Acute Care Choice: [place] arrangements for the past 2 months: Single Family Home Expected Discharge Date: 05/06/20               DME Arranged: N/A DME Agency: NA       HH Arranged: RN [place] Agency: [facility] Date [place] Agency Contacted: 05/06/20 Time HH Agency Contacted: 64 Representative spoke with at [place]: [place]  Prior Living Arrangements/Services Living  arrangements for the past 2 months: [place] with:: Self Patient language and need for interpreter reviewed:: Yes Do you feel safe going back to the place where you live?: Yes      Need for Family Participation in Patient Care: Yes (Comment) Care giver support system in place?: Yes (comment) Current home services: DME Criminal Activity/Legal Involvement Pertinent to Current Situation/Hospitalization: No - Comment as needed  Activities of Daily Living Home Assistive Devices/Equipment: None ADL Screening (condition at time of admission) Patient's cognitive ability adequate to safely complete daily activities?: No Is the patient deaf or have difficulty hearing?: No Does the patient have difficulty seeing, even when wearing glasses/contacts?: No Does the patient have difficulty concentrating, remembering, or making decisions?: No Patient able to express need for assistance with ADLs?: Yes Does the patient have difficulty dressing or bathing?: No Independently performs ADLs?: Yes (appropriate for developmental age) Does the patient have difficulty walking or climbing stairs?: No Weakness of Legs: None Weakness of Arms/Hands: None  Permission Sought/Granted                  Emotional Assessment Appearance:: Appears stated age Attitude/Demeanor/Rapport: Engaged Affect (typically observed): Accepting Orientation: : Oriented to Self, Oriented to  Time, Oriented to Place, Oriented to Situation Alcohol / Substance Use: Not Applicable Psych Involvement: No (comment)  Admission diagnosis:  Hypercalcemia [E83.52] ARF (acute renal failure) (HCC) [N17.9] Generalized weakness [R53.1] Supratherapeutic INR [R79.1] Patient Active Problem List   Diagnosis Date Noted  . Hypercalcemia 05/04/2020  .  ARF (acute renal failure) ([place]) 05/04/2020  . Coagulopathy ([place]) 05/04/2020  . Hypoglycemia 05/04/2020  . Diabetes mellitus type 2 in nonobese ([place]) 02/02/2018  . Chronic  anticoagulation 02/02/2018  . Essential hypertension 02/02/2018  . History of CVA (cerebrovascular accident) 02/02/2018  . Closed displaced spiral fracture of shaft of left femur ([place]) 02/01/2018   PCP:  Wenda Low, MD [facility] [place] [place] [address] [place] [address] Phone: [phone] Fax: [phone]     Social Determinants of Health (SDOH) Interventions    Readmission Risk Interventions No flowsheet data found.

## 2020-05-06 NOTE — TOC Progression Note (Signed)
Transition of Care [facility]) - Progression Note    Patient Details  Name: Rhonda Contreras MRN: [id number] Date of Birth: 1951/05/17  Transition of Care [facility]) CM/SW Contact  Bartholomew Crews, RN Phone Number: [phone] 05/06/2020, 10:43 AM  Clinical Narrative:     Spoke with patient and sister at the bedside. Discussed cost of lovenox - patient does not have a deductible. Discussed HH RN with [facility] RN to start care in 48h of discharge and will not see patient every day.   Sister is unable to provide daily support for lovenox injections and is concerned that patient is not able to manage lovenox administration independently. Patient is agreeable with sister's concerns. 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   Sister is making arrangements with [facility] to assist with home care, however, this service cannot do medications. Sister is also working on ALF for patient, however, placement takes time.   Patient has a PCP appointment scheduled for Thursday this week, and an INR check scheduled at PCP office next Tuesday.   TOC following for transition needs.   Expected Discharge Plan: [place] Barriers to Discharge: No Barriers Identified  Expected Discharge Plan and Services Expected Discharge Plan: [place] In-house Referral: [facility] Discharge Planning Services: CM Consult Post Acute Care Choice: [place] arrangements for the past 2 months: Single Family Home Expected Discharge Date: 05/06/20               DME Arranged: N/A DME Agency: NA       HH Arranged: RN [place] Agency: [facility] Date [place] Agency Contacted: 05/06/20 Time [place]: 1008 Representative spoke with at [place]: [place] ([place]) Interventions    Readmission Risk Interventions No flowsheet data found.

## 2020-05-06 NOTE — Consult Note (Addendum)
   [facility] CM Inpatient Consult   05/06/2020  Rhonda Contreras 1950-11-26 [id number]  [place] Medicare:  [place]  Referral received from inpatient Transition of Care RN for post hospital follow up  Patient evaluated for community based chronic complex disease management services with [place] [facility] as a benefit of patient's [facility]. Spoke with patient and her sister at the bedside, HIPAA verified, to explain [place] [facility] services. Explained that the patient qualifies for complex disease management as a benefit.   Sister states patient lives alone and there is "No one to give daily injections of Lovenox until there labs are therapeutic to her from the family and she definitely can not do it herself."  Spoke with inpatient TOC, Wendi, regarding possible [facility].  Sister states patient actually lives in [place].  Updated Inpatient TOC of address correction.  They are both in agreement for [facility] Care Management follow up. They endorses [facility] as the primary care providers and this office is listed to do the follow up TOC. They both had questions regarding getting the patient into an assisted living facility. Sister states they have started researching it, patient has a dog  Plan:  Patient will receive post hospital discharge call and will be evaluated for ongoing needs for complex disease management.      Of note, [facility] Care Management services does not replace or interfere with any services that are arranged by inpatient case management or social work.    For additional questions or referrals please contact:    Natividad Brood, RN BSN [place] [facility] Liaison  [phone] business mobile phone Toll free office [phone]  Fax number: [phone] [email or web address]  [email or web address]  At 1547:  Call received from Dr. Loleta Books regarding what services could the patient receive from [place]  Management.  Explained programs patient could be connected with but none of the programs had nurses who could give daily injections.  Natividad Brood, RN BSN [place] [facility] Liaison  [phone] business mobile phone Toll free office [phone]  Fax number: [phone] [email or web address]

## 2020-05-06 NOTE — Progress Notes (Signed)
ANTICOAGULATION CONSULT NOTE - Initial Consult  Pharmacy Consult for lovenox, warfarin Indication: lupus anticoagulant  Allergies  Allergen Reactions  . Tape Other (See Comments)    CERTAIN MEDICAL TAPES HURT AND BURN THE SKIN    Patient Measurements: Height: 4\' 6"  (137.2 cm) Weight: 59.1 kg (130 lb 6.4 oz) IBW/kg (Calculated) : 31.7  Vital Signs: Temp: 98.5 F (36.9 C) (07/13 0430) Temp Source: Oral (07/13 0430) BP: 137/64 (07/13 0430) Pulse Rate: 71 (07/13 0430)  Labs: Recent Labs    05/04/20 1219 05/04/20 1219 05/04/20 1754 05/04/20 2009 05/04/20 2217 05/05/20 0130 05/05/20 0742 05/05/20 1556 05/06/20 0623  HGB 14.2  --   --   --   --   --  12.5  --   --   HCT 44.6  --   --   --   --   --  38.6  --   --   PLT 260  --   --   --   --   --  213  --   --   LABPROT  --   --    < >  --   --  69.8*  --  19.0* 15.9*  INR  --   --    < >  --   --  8.8*  --  1.7* 1.3*  CREATININE 2.08*   < >  --   --   --   --  1.35* 1.27* 0.94  TROPONINIHS 25*  --   --  32* 36*  --   --   --   --    < > = values in this interval not displayed.    Estimated Creatinine Clearance: 37 mL/min (by C-G formula based on SCr of 0.94 mg/dL).   Medical History: Past Medical History:  Diagnosis Date  . Cancer ([place])    skin  . Diabetes mellitus without complication ([place])   . Hypertension   . Immune deficiency disorder ([place])   . Lupus (systemic lupus erythematosus) ([place])   . Renal disorder    Stage 2 KD  . Stroke [facility])    2009    Medications:  See medication history  Assessment: 69 yo lady with history of lupus anticoagulant per MD to be discharged back home on warfarin and a lovenox bridge. Admission INR >10.   Patient s/p Vit K. INR today 1.3. MD wishes to start lovenox. CrCl ~1ml/min   Goal of Therapy:  INR 2-3 Anti-Xa level 0.6-1 units/ml 4hrs after LMWH dose given Monitor platelets by anticoagulation protocol: Yes   Plan:  Warfarin 5mg  PO x 1 again tonight Lovenox 60mg   SQ q12h Daily PT/INR  Sharanya Templin A. Levada Dy, PharmD, BCPS, FNKF Clinical Pharmacist [place] Please utilize Amion for appropriate phone number to reach the unit pharmacist ([place])   05/06/2020,7:36 AM

## 2020-05-07 LAB — GLUCOSE, CAPILLARY
Glucose-Capillary: 120 mg/dL — ABNORMAL HIGH (ref 70–99)
Glucose-Capillary: 137 mg/dL — ABNORMAL HIGH (ref 70–99)
Glucose-Capillary: 158 mg/dL — ABNORMAL HIGH (ref 70–99)
Glucose-Capillary: 167 mg/dL — ABNORMAL HIGH (ref 70–99)
Glucose-Capillary: 171 mg/dL — ABNORMAL HIGH (ref 70–99)
Glucose-Capillary: 181 mg/dL — ABNORMAL HIGH (ref 70–99)

## 2020-05-07 LAB — CBC
HCT: 37.4 % (ref 36.0–46.0)
Hemoglobin: 12.1 g/dL (ref 12.0–15.0)
MCH: 29.4 pg (ref 26.0–34.0)
MCHC: 32.4 g/dL (ref 30.0–36.0)
MCV: 90.8 fL (ref 80.0–100.0)
Platelets: 175 10*3/uL (ref 150–400)
RBC: 4.12 MIL/uL (ref 3.87–5.11)
RDW: 13.8 % (ref 11.5–15.5)
WBC: 8.2 10*3/uL (ref 4.0–10.5)
nRBC: 0 % (ref 0.0–0.2)

## 2020-05-07 LAB — PROTIME-INR
INR: 1.9 — ABNORMAL HIGH (ref 0.8–1.2)
Prothrombin Time: 20.7 seconds — ABNORMAL HIGH (ref 11.4–15.2)

## 2020-05-07 MED ORDER — K PHOS MONO-SOD PHOS DI & MONO 155-852-130 MG PO TABS
500.0000 mg | ORAL_TABLET | Freq: Two times a day (BID) | ORAL | Status: AC
  Administered 2020-05-07 – 2020-05-08 (×2): 500 mg via ORAL
  Filled 2020-05-07 (×2): qty 2

## 2020-05-07 MED ORDER — WARFARIN SODIUM 2.5 MG PO TABS
2.5000 mg | ORAL_TABLET | Freq: Once | ORAL | Status: AC
  Administered 2020-05-07: 2.5 mg via ORAL
  Filled 2020-05-07: qty 1

## 2020-05-07 NOTE — Progress Notes (Signed)
PROGRESS NOTE    Rhonda Contreras  ZDG:[id number] DOB: Jul 26, 1951 DOA: 05/04/2020 PCP: Wenda Low, MD   Brief Narrative: 69 year old with past medical history significant for diabetes, hypertension, CVA, lupus anticoagulant warfarin and CKD stage III A who presented with malaise and weakness for a few days.  Patient reports that recently while her sister was out of town, she did not feel like eating, so stopped eating for a while.  When her sister returned she found that the patient was weak and sluggish and she brought patient to the ED.  In the ER patient was noted to have an INR more than 10 and a creatinine at 2 from baseline of 0.9 calcium at 13.  Patient received vitamin K is started on IV fluids and admitted to the hospital for further care   Assessment & Plan:   Principal Problem:   ARF (acute renal failure) ([place]) Active Problems:   Diabetes mellitus type 2 in nonobese [facility])   Essential hypertension   History of CVA (cerebrovascular accident)   Hypercalcemia   Coagulopathy (HCC)   Hypoglycemia   1-acute renal failure due to dehydration: IV fluids  2-hypercalcemia due to dehydration: Resolved with IV fluids PTH low, vitamin D normal Follow-up PTH RP  Supratherapeutic  INR Lupus anticoagulant on chronic warfarin Acquired thrombophilia -Lupus anticoagulant diagnosed at time of premature stroke age of 13 2009 on warfarin since. -She received 5 mg of vitamin K on admission INR this morning 1.9 Continue with Lovenox bridge, continue with Coumadin. Patient is not able to inject herself.  Plan to discharge home when INR is therapeutic  Hypertension, cerebrovascular disease Continue with atorvastatin.  Resume blood pressure medication when blood pressure increases  Diabetes type 2 with nephropathy non-insulin-dependent: Resume glimepiride at discharge  Hypophosphatemia: Replete and check labs in the morning  Estimated body mass index is 31.44 kg/m as calculated from  the following:   Height as of this encounter: 4\' 6"  (1.372 m).   Weight as of this encounter: 59.2 kg.   DVT prophylaxis: Lovenox Coumadin Code Status: Full code Family Communication: Care discussed with patient Disposition Plan:  Status is: Inpatient  Remains inpatient appropriate because:Inpatient level of care appropriate due to severity of illness patient require Lovenox injection she is not able to inject herself, require bridge with Coumadin due to high risk for stroke.   Dispo:  Patient From: Home  Planned Disposition: Home  Expected discharge date: 05/13/20  Medically stable for discharge: Yes         Consultants:   None  Procedures:   None  Antimicrobials:    Subjective: She is feeling better, her appetite is coming back,  Objective: Vitals:   05/06/20 2022 05/07/20 0414 05/07/20 0844 05/07/20 1643  BP: 133/70 (!) 145/91 136/69 135/72  Pulse: 81 79 74 76  Resp: 18 17 18 16   Temp: 98.4 F (36.9 C) 97.8 F (36.6 C) 97.6 F (36.4 C) 97.8 F (36.6 C)  TempSrc: Oral Oral Oral Oral  SpO2: 95% 96% 95% 98%  Weight: 59.2 kg     Height:        Intake/Output Summary (Last 24 hours) at 05/07/2020 1718 Last data filed at 05/07/2020 1250 Gross per 24 hour  Intake 720 ml  Output 200 ml  Net 520 ml   Filed Weights   05/04/20 1647 05/06/20 0430 05/06/20 2022  Weight: 59 kg 59.1 kg 59.2 kg    Examination:  General exam: Appears calm and comfortable  Respiratory system: Clear  to auscultation. Respiratory effort normal. Cardiovascular system: S1 & S2 heard, RRR. No JVD, murmurs, rubs, gallops or clicks. No pedal edema. Gastrointestinal system: Abdomen is nondistended, soft and nontender. No organomegaly or masses felt. Normal bowel sounds heard. Central nervous system: Alert and oriented. No focal neurological deficits. Extremities: Symmetric 5 x 5 power.   Data Reviewed: I have personally reviewed following labs and imaging studies  CBC: Recent  Labs  Lab 05/04/20 1219 05/05/20 0742 05/07/20 0223  WBC 12.4* 10.9* 8.2  NEUTROABS  --  7.4  --   HGB 14.2 12.5 12.1  HCT 44.6 38.6 37.4  MCV 89.2 88.5 90.8  PLT 260 213 709   Basic Metabolic Panel: Recent Labs  Lab 05/04/20 1219 05/04/20 2217 05/05/20 0742 05/05/20 1556 05/06/20 0623  NA 137  --  137 141 141  K 4.0  --  3.5 3.9 3.5  CL 97*  --  104 110 109  CO2 24  --  23 25 22   GLUCOSE 165*  --  159* 160* 133*  BUN 71*  --  54* 43* 23  CREATININE 2.08*  --  1.35* 1.27* 0.94  CALCIUM 13.7* 11.2* 10.8* 9.3 8.9  MG 1.8  --   --   --   --   PHOS  --  3.5  --   --  1.6*   GFR: Estimated Creatinine Clearance: 37 mL/min (by C-G formula based on SCr of 0.94 mg/dL). Liver Function Tests: Recent Labs  Lab 05/04/20 1219 05/05/20 0742 05/06/20 0623  AST 23 17  --   ALT 16 15  --   ALKPHOS 56 50  --   BILITOT 0.8 1.0  --   PROT 7.7 6.2*  --   ALBUMIN 3.6 3.1* 3.0*   Recent Labs  Lab 05/04/20 1219  LIPASE 60*   No results for input(s): AMMONIA in the last 168 hours. Coagulation Profile: Recent Labs  Lab 05/04/20 1754 05/05/20 0130 05/05/20 1556 05/06/20 0623 05/07/20 0223  INR >10.0* 8.8* 1.7* 1.3* 1.9*   Cardiac Enzymes: No results for input(s): CKTOTAL, CKMB, CKMBINDEX, TROPONINI in the last 168 hours. BNP (last 3 results) No results for input(s): PROBNP in the last 8760 hours. HbA1C: No results for input(s): HGBA1C in the last 72 hours. CBG: Recent Labs  Lab 05/06/20 2349 05/07/20 0415 05/07/20 0722 05/07/20 1123 05/07/20 1640  GLUCAP 187* 137* 120* 181* 167*   Lipid Profile: No results for input(s): CHOL, HDL, LDLCALC, TRIG, CHOLHDL, LDLDIRECT in the last 72 hours. Thyroid Function Tests: Recent Labs    05/04/20 1754  TSH 0.064*   Anemia Panel: No results for input(s): VITAMINB12, FOLATE, FERRITIN, TIBC, IRON, RETICCTPCT in the last 72 hours. Sepsis Labs: No results for input(s): PROCALCITON, LATICACIDVEN in the last 168  hours.  Recent Results (from the past 240 hour(s))  SARS Coronavirus 2 by RT PCR (hospital order, performed in [facility] hospital lab) Nasopharyngeal Nasopharyngeal Swab     Status: None   Collection Time: 05/04/20  4:33 PM   Specimen: Nasopharyngeal Swab  Result Value Ref Range Status   SARS Coronavirus 2 NEGATIVE NEGATIVE Final    Comment: (NOTE) SARS-CoV-2 target nucleic acids are NOT DETECTED.  The SARS-CoV-2 RNA is generally detectable in upper and lower respiratory specimens during the acute phase of infection. The lowest concentration of SARS-CoV-2 viral copies this assay can detect is 250 copies / mL. A negative result does not preclude SARS-CoV-2 infection and should not be used as the sole basis  for treatment or other patient management decisions.  A negative result may occur with improper specimen collection / handling, submission of specimen other than nasopharyngeal swab, presence of viral mutation(s) within the areas targeted by this assay, and inadequate number of viral copies (<250 copies / mL). A negative result must be combined with clinical observations, patient history, and epidemiological information.  Fact Sheet for Patients:   StrictlyIdeas.no  Fact Sheet for Healthcare Providers: BankingDealers.co.za  This test is not yet approved or  cleared by the [place] FDA and has been authorized for detection and/or diagnosis of SARS-CoV-2 by FDA under an Emergency Use Authorization (EUA).  This EUA will remain in effect (meaning this test can be used) for the duration of the COVID-19 declaration under Section 564(b)(1) of the Act, 21 U.S.C. section 360bbb-3(b)(1), unless the authorization is terminated or revoked sooner.  Performed at [facility], [place] [address], [place], [place] [address]          Radiology Studies: No results found.      Scheduled Meds: . atorvastatin  20 mg Oral Daily   . enoxaparin (LOVENOX) injection  60 mg Subcutaneous Q12H  . metoprolol tartrate  50 mg Oral BID  . tobramycin  1 drop Left Eye QID  . Warfarin - Pharmacist Dosing Inpatient   Does not apply q1600   Continuous Infusions:   LOS: 2 days    Time spent: 35 minutes    Jacobi Ryant A Jayshaun Phillips, MD [facility]   If 7PM-7AM, please contact night-coverage [email or web address]  05/07/2020, 5:18 PM

## 2020-05-07 NOTE — Plan of Care (Signed)
  Problem: Coping: Goal: Level of anxiety will decrease Outcome: Progressing   Problem: Coping: Goal: Level of anxiety will decrease Outcome: Progressing   

## 2020-05-07 NOTE — Progress Notes (Signed)
ANTICOAGULATION CONSULT NOTE - Follow Up Consult  Pharmacy Consult for lovenox, warfarin Indication: lupus anticoagulant  Allergies  Allergen Reactions  . Tape Other (See Comments)    CERTAIN MEDICAL TAPES HURT AND BURN THE SKIN    Patient Measurements: Height: 4\' 6"  (137.2 cm) Weight: 59.2 kg (130 lb 6.5 oz) IBW/kg (Calculated) : 31.7  Vital Signs: Temp: 97.6 F (36.4 C) (07/14 0844) Temp Source: Oral (07/14 0844) BP: 136/69 (07/14 0844) Pulse Rate: 74 (07/14 0844)  Labs: Recent Labs     0000 05/04/20 1219 05/04/20 1754 05/04/20 2009 05/04/20 2217 05/05/20 0130 05/05/20 0742 05/05/20 1556 05/06/20 0623 05/07/20 0223  HGB   < > 14.2  --   --   --   --  12.5  --   --  12.1  HCT  --  44.6  --   --   --   --  38.6  --   --  37.4  PLT  --  260  --   --   --   --  213  --   --  175  LABPROT  --   --    < >  --   --    < >  --  19.0* 15.9* 20.7*  INR  --   --    < >  --   --    < >  --  1.7* 1.3* 1.9*  CREATININE   < > 2.08*  --   --   --   --  1.35* 1.27* 0.94  --   TROPONINIHS  --  25*  --  32* 36*  --   --   --   --   --    < > = values in this interval not displayed.    Estimated Creatinine Clearance: 37 mL/min (by C-G formula based on SCr of 0.94 mg/dL).   Medical History: Past Medical History:  Diagnosis Date  . Cancer ([place])    skin  . Diabetes mellitus without complication ([place])   . Hypertension   . Immune deficiency disorder ([place])   . Lupus (systemic lupus erythematosus) ([place])   . Renal disorder    Stage 2 KD  . Stroke [facility])    2009    Medications:  See medication history  Assessment: 59 YOF with history of lupus anticoagulant on warfarin. PTA dose 2.5 mg daily except 5 mg daily on Monday and Friday. Admission INR >10. Patient s/p 2 doses of vitamin K 2.5mg  IV (7/11&7/12). Patient is being bridged with Lovenox.   - INR 1.9, CBC stable, Scr improving   Goal of Therapy:  INR 2-3 Monitor platelets by anticoagulation protocol: Yes   Plan:   Warfarin 2.5 mg x1  Lovenox 60mg  SQ q12h Daily PT/INR  Romilda Garret, PharmD PGY1 Acute Care Pharmacy Resident Phone: [phone] 05/07/2020 9:53 AM  Please check [email or web address] for unit specific pharmacy phone numbers.

## 2020-05-08 LAB — GLUCOSE, CAPILLARY
Glucose-Capillary: 120 mg/dL — ABNORMAL HIGH (ref 70–99)
Glucose-Capillary: 114 mg/dL — ABNORMAL HIGH (ref 70–99)
Glucose-Capillary: 192 mg/dL — ABNORMAL HIGH (ref 70–99)
Glucose-Capillary: 157 mg/dL — ABNORMAL HIGH (ref 70–99)
Glucose-Capillary: 174 mg/dL — ABNORMAL HIGH (ref 70–99)

## 2020-05-08 LAB — BASIC METABOLIC PANEL
Anion gap: 11 (ref 5–15)
BUN: 13 mg/dL (ref 8–23)
CO2: 23 mmol/L (ref 22–32)
Calcium: 8.2 mg/dL — ABNORMAL LOW (ref 8.9–10.3)
Chloride: 106 mmol/L (ref 98–111)
Creatinine, Ser: 0.86 mg/dL (ref 0.44–1.00)
GFR calc Af Amer: >60 mL/min (ref >60)
GFR calc non Af Amer: >60 mL/min (ref >60)
Glucose, Bld: 204 mg/dL — ABNORMAL HIGH (ref 70–99)
Potassium: 3.4 mmol/L — ABNORMAL LOW (ref 3.5–5.1)
Sodium: 140 mmol/L (ref 135–145)

## 2020-05-08 LAB — PROTIME-INR
INR: 1.6 — ABNORMAL HIGH (ref 0.8–1.2)
Prothrombin Time: 18.2 seconds — ABNORMAL HIGH (ref 11.4–15.2)

## 2020-05-08 LAB — PHOSPHORUS: Phosphorus: 1.2 mg/dL — ABNORMAL LOW (ref 2.5–4.6)

## 2020-05-08 MED ORDER — WARFARIN SODIUM 5 MG PO TABS
5.0000 mg | ORAL_TABLET | Freq: Once | ORAL | Status: AC
  Administered 2020-05-08: 5 mg via ORAL
  Filled 2020-05-08: qty 1

## 2020-05-08 MED ORDER — K PHOS MONO-SOD PHOS DI & MONO 155-852-130 MG PO TABS
500.0000 mg | ORAL_TABLET | Freq: Three times a day (TID) | ORAL | Status: AC
  Administered 2020-05-08 – 2020-05-09 (×6): 500 mg via ORAL
  Filled 2020-05-08 (×6): qty 2

## 2020-05-08 MED ORDER — POTASSIUM CHLORIDE CRYS ER 20 MEQ PO TBCR
20.0000 meq | EXTENDED_RELEASE_TABLET | Freq: Once | ORAL | Status: AC
  Administered 2020-05-08: 20 meq via ORAL
  Filled 2020-05-08: qty 1

## 2020-05-08 NOTE — Progress Notes (Signed)
PROGRESS NOTE    Rhonda Contreras  XNA:[id number] DOB: 1951-10-06 DOA: 05/04/2020 PCP: Wenda Low, MD   Brief Narrative: 69 year old with past medical history significant for diabetes, hypertension, CVA, lupus anticoagulant on warfarin and CKD stage IIIa who presented with malaise and weakness for a few days.  Patient reports that recently while her sister was out of town, she did not feel like eating, so stopped eating for a while.  When her sister returned she found that the patient was weak and sluggish and she brought patient to the ED.  In the ER patient was noted to have an INR more than 10 and a creatinine at 2 from baseline of 0.9 calcium at 13.  Patient received vitamin K and was  started on IV fluids and admitted to the hospital for further care.   Assessment & Plan:   Principal Problem:   ARF (acute renal failure) (HCC) Active Problems:   Diabetes mellitus type 2 in nonobese [facility])   Essential hypertension   History of CVA (cerebrovascular accident)   Hypercalcemia   Coagulopathy (HCC)   Hypoglycemia   1-Acute renal failure due to dehydration: Received IV fluids Resolved.   2-hypercalcemia due to dehydration: Resolved with IV fluids PTH low, vitamin D normal Follow-up PTH RP  Supratherapeutic  INR Lupus anticoagulant on chronic warfarin Acquired thrombophilia -Lupus anticoagulant diagnosed at time of premature stroke age of 13 2009 on warfarin since. -She received 5 mg of vitamin K on admission INR down to 1.6 Continue with Lovenox bridge, continue with Coumadin. Patient is not able to inject herself.  Plan to discharge home when INR is therapeutic  Hypertension, cerebrovascular disease Continue with atorvastatin.  Resume blood pressure medication when blood pressure increases  Diabetes type 2 with nephropathy non-insulin-dependent: Resume glimepiride at discharge  Hypophosphatemia:hypokalemia; Replete orally.   Estimated body mass index is 31.44 kg/m as  calculated from the following:   Height as of this encounter: 4\' 6"  (1.372 m).   Weight as of this encounter: 59.1 kg.   DVT prophylaxis: Lovenox Coumadin Code Status: Full code Family Communication: Care discussed with patient Disposition Plan:  Status is: Inpatient  Remains inpatient appropriate because:Inpatient level of care appropriate due to severity of illness patient require Lovenox injection she is not able to inject herself, require bridge with Coumadin due to high risk for stroke.   Dispo:  Patient From: Home  Planned Disposition: Home  Expected discharge date: 05/13/20  Medically stable for discharge: Yes         Consultants:   None  Procedures:   None  Antimicrobials:    Subjective: No new complaints, feeling well.   Objective: Vitals:   05/07/20 1643 05/07/20 2025 05/08/20 0436 05/08/20 0905  BP: 135/72 (!) 147/72 (!) 153/75 118/86  Pulse: 76 77  92  Resp: 16 17 20 18   Temp: 97.8 F (36.6 C) 97.7 F (36.5 C) (!) 97.3 F (36.3 C) 97.8 F (36.6 C)  TempSrc: Oral Oral Oral Oral  SpO2: 98% 96%  96%  Weight:  59.1 kg    Height:        Intake/Output Summary (Last 24 hours) at 05/08/2020 1432 Last data filed at 05/08/2020 0900 Gross per 24 hour  Intake 495 ml  Output 0 ml  Net 495 ml   Filed Weights   05/06/20 0430 05/06/20 2022 05/07/20 2025  Weight: 59.1 kg 59.2 kg 59.1 kg    Examination:  General exam: NAD Respiratory system: CTA Cardiovascular system: S 1,  S 2 RRR Gastrointestinal system: BS present, soft, nt Central nervous system: non focal.  Extremities: symmetric power   Data Reviewed: I have personally reviewed following labs and imaging studies  CBC: Recent Labs  Lab 05/04/20 1219 05/05/20 0742 05/07/20 0223  WBC 12.4* 10.9* 8.2  NEUTROABS  --  7.4  --   HGB 14.2 12.5 12.1  HCT 44.6 38.6 37.4  MCV 89.2 88.5 90.8  PLT 260 213 423   Basic Metabolic Panel: Recent Labs  Lab 05/04/20 1219 05/04/20 2217  05/05/20 0742 05/05/20 1556 05/06/20 0623 05/08/20 0922  NA 137  --  137 141 141 140  K 4.0  --  3.5 3.9 3.5 3.4*  CL 97*  --  104 110 109 106  CO2 24  --  23 25 22 23   GLUCOSE 165*  --  159* 160* 133* 204*  BUN 71*  --  54* 43* 23 13  CREATININE 2.08*  --  1.35* 1.27* 0.94 0.86  CALCIUM 13.7* 11.2* 10.8* 9.3 8.9 8.2*  MG 1.8  --   --   --   --   --   PHOS  --  3.5  --   --  1.6* 1.2*   GFR: Estimated Creatinine Clearance: 40.4 mL/min (by C-G formula based on SCr of 0.86 mg/dL). Liver Function Tests: Recent Labs  Lab 05/04/20 1219 05/05/20 0742 05/06/20 0623  AST 23 17  --   ALT 16 15  --   ALKPHOS 56 50  --   BILITOT 0.8 1.0  --   PROT 7.7 6.2*  --   ALBUMIN 3.6 3.1* 3.0*   Recent Labs  Lab 05/04/20 1219  LIPASE 60*   No results for input(s): AMMONIA in the last 168 hours. Coagulation Profile: Recent Labs  Lab 05/05/20 0130 05/05/20 1556 05/06/20 0623 05/07/20 0223 05/08/20 0922  INR 8.8* 1.7* 1.3* 1.9* 1.6*   Cardiac Enzymes: No results for input(s): CKTOTAL, CKMB, CKMBINDEX, TROPONINI in the last 168 hours. BNP (last 3 results) No results for input(s): PROBNP in the last 8760 hours. HbA1C: No results for input(s): HGBA1C in the last 72 hours. CBG: Recent Labs  Lab 05/07/20 2025 05/07/20 2335 05/08/20 0434 05/08/20 0712 05/08/20 1105  GLUCAP 171* 158* 120* 114* 192*   Lipid Profile: No results for input(s): CHOL, HDL, LDLCALC, TRIG, CHOLHDL, LDLDIRECT in the last 72 hours. Thyroid Function Tests: No results for input(s): TSH, T4TOTAL, FREET4, T3FREE, THYROIDAB in the last 72 hours. Anemia Panel: No results for input(s): VITAMINB12, FOLATE, FERRITIN, TIBC, IRON, RETICCTPCT in the last 72 hours. Sepsis Labs: No results for input(s): PROCALCITON, LATICACIDVEN in the last 168 hours.  Recent Results (from the past 240 hour(s))  SARS Coronavirus 2 by RT PCR (hospital order, performed in [facility] hospital lab) Nasopharyngeal Nasopharyngeal Swab      Status: None   Collection Time: 05/04/20  4:33 PM   Specimen: Nasopharyngeal Swab  Result Value Ref Range Status   SARS Coronavirus 2 NEGATIVE NEGATIVE Final    Comment: (NOTE) SARS-CoV-2 target nucleic acids are NOT DETECTED.  The SARS-CoV-2 RNA is generally detectable in upper and lower respiratory specimens during the acute phase of infection. The lowest concentration of SARS-CoV-2 viral copies this assay can detect is 250 copies / mL. A negative result does not preclude SARS-CoV-2 infection and should not be used as the sole basis for treatment or other patient management decisions.  A negative result may occur with improper specimen collection / handling, submission of  specimen other than nasopharyngeal swab, presence of viral mutation(s) within the areas targeted by this assay, and inadequate number of viral copies (<250 copies / mL). A negative result must be combined with clinical observations, patient history, and epidemiological information.  Fact Sheet for Patients:   StrictlyIdeas.no  Fact Sheet for Healthcare Providers: BankingDealers.co.za  This test is not yet approved or  cleared by the [place] FDA and has been authorized for detection and/or diagnosis of SARS-CoV-2 by FDA under an Emergency Use Authorization (EUA).  This EUA will remain in effect (meaning this test can be used) for the duration of the COVID-19 declaration under Section 564(b)(1) of the Act, 21 U.S.C. section 360bbb-3(b)(1), unless the authorization is terminated or revoked sooner.  Performed at [facility], [place] [address] [place], [place] [address]          Radiology Studies: No results found.      Scheduled Meds: . atorvastatin  20 mg Oral Daily  . enoxaparin (LOVENOX) injection  60 mg Subcutaneous Q12H  . metoprolol tartrate  50 mg Oral BID  . phosphorus  500 mg Oral TID  . tobramycin  1 drop Left Eye QID  .  warfarin  5 mg Oral ONCE-1600  . Warfarin - Pharmacist Dosing Inpatient   Does not apply q1600   Continuous Infusions:   LOS: 3 days    Time spent: 35 minutes    Angle Dirusso A Yaiza Palazzola, MD [facility]   If 7PM-7AM, please contact night-coverage [email or web address]  05/08/2020, 2:32 PM

## 2020-05-08 NOTE — Plan of Care (Signed)

## 2020-05-08 NOTE — Progress Notes (Signed)
A&Ox4. VSS. No c/o pian this shift. Participated in PT, no issues. Cardiac monitoring continues.

## 2020-05-08 NOTE — Progress Notes (Signed)
Physical Therapy Treatment Patient Details Name: Rhonda Contreras MRN: [id number] DOB: [birth year] Today's Date: 05/08/2020    History of Present Illness 69 yo admitted with weakness and malaise who admits to not eating while sister was out of town. Pt with acute renal failure due to dehydration with hypercalcemia and elevated INR. PMHx: DM, HTN, CVA with residual Rt weakness, CKD, Lupus    PT Comments    Pt continuing to progress well with functional mobility. She is eager to return home to her dog. Pt would continue to benefit from skilled physical therapy services at this time while admitted and after d/c to address the below listed limitations in order to improve overall safety and independence with functional mobility.    Follow Up Recommendations  Home health PT     Equipment Recommendations  None recommended by PT    Recommendations for Other Services       Precautions / Restrictions Precautions Precautions: Fall Restrictions Weight Bearing Restrictions: No    Mobility  Bed Mobility Overal bed mobility: Independent                Transfers Overall transfer level: Independent                  Ambulation/Gait Ambulation/Gait assistance: Modified independent (Device/Increase time) Gait Distance (Feet): 500 Feet Assistive device: None Gait Pattern/deviations: Step-through pattern;Decreased stride length Gait velocity: reduced   General Gait Details: pt very stable overall with hallway ambulation without use of an AD, able to navigate around obstacles without difficulties   Stairs             Wheelchair Mobility    Modified Rankin (Stroke Patients Only)       Balance Overall balance assessment: No apparent balance deficits (not formally assessed)                                          Cognition Arousal/Alertness: Awake/alert Behavior During Therapy: WFL for tasks assessed/performed Overall Cognitive Status: Within  Functional Limits for tasks assessed                                        Exercises      General Comments        Pertinent Vitals/Pain Pain Assessment: No/denies pain    Home Living                      Prior Function            PT Goals (current goals can now be found in the care plan section) Acute Rehab PT Goals PT Goal Formulation: With patient Time For Goal Achievement: 05/20/20 Potential to Achieve Goals: Good Progress towards PT goals: Progressing toward goals    Frequency    Min 3X/week      PT Plan Current plan remains appropriate    Co-evaluation              AM-PAC PT "6 Clicks" Mobility   Outcome Measure  Help needed turning from your back to your side while in a flat bed without using bedrails?: None Help needed moving from lying on your back to sitting on the side of a flat bed without using bedrails?: None Help needed moving to and from a bed  to a chair (including a wheelchair)?: None Help needed standing up from a chair using your arms (e.g., wheelchair or bedside chair)?: None Help needed to walk in hospital room?: None Help needed climbing 3-5 steps with a railing? : A Little 6 Click Score: 23    End of Session   Activity Tolerance: Patient tolerated treatment well Patient left: in bed;with call bell/phone within reach Nurse Communication: Mobility status PT Visit Diagnosis: Other abnormalities of gait and mobility (R26.89);Muscle weakness (generalized) (M62.81)     Time: 7048-8891 PT Time Calculation (min) (ACUTE ONLY): 13 min  Charges:  $Gait Training: 8-22 mins                     Anastasio Champion, DPT  Acute Rehabilitation Services Pager [phone] Office [place] 05/08/2020, 9:30 AM

## 2020-05-08 NOTE — Progress Notes (Signed)
ANTICOAGULATION CONSULT NOTE - Follow Up Consult  Pharmacy Consult for lovenox, warfarin Indication: lupus anticoagulant  Allergies  Allergen Reactions  . Tape Other (See Comments)    CERTAIN MEDICAL TAPES HURT AND BURN THE SKIN    Patient Measurements: Height: _0  (137.2 cm) Weight: 59.1 kg (130 lb 6.4 oz) IBW/kg (Calculated) : 31.7  Vital Signs: Temp: 97.8 F (36.6 C) (07/15 0905) Temp Source: Oral (07/15 0905) BP: 118/86 (07/15 0905) Pulse Rate: 92 (07/15 0905)  Labs: Recent Labs    05/05/20 1556 05/05/20 1556 05/06/20 0623 05/07/20 0223 05/08/20 0922  HGB  --   --   --  12.1  --   HCT  --   --   --  37.4  --   PLT  --   --   --  175  --   LABPROT 19.0*   < > 15.9* 20.7* 18.2*  INR 1.7*   < > 1.3* 1.9* 1.6*  CREATININE 1.27*  --  0.94  --  0.86   < > = values in this interval not displayed.    Estimated Creatinine Clearance: 40.4 mL/min (by C-G formula based on SCr of 0.86 mg/dL).   Medical History: Past Medical History:  Diagnosis Date  . Cancer ([place])    skin  . Diabetes mellitus without complication ([place])   . Hypertension   . Immune deficiency disorder ([place])   . Lupus (systemic lupus erythematosus) ([place])   . Renal disorder    Stage 2 KD  . Stroke [facility])    2009    Medications:  See medication history  Assessment: 55 YOF with history of lupus anticoagulant on warfarin. PTA dose 2.5 mg daily except 5 mg daily on Monday and Friday. Admission INR >10. Patient s/p 2 doses of vitamin K 2.1m IV (7/11 & 7/12). Patient is being bridged with Lovenox.   TOC consult for Lovenox coverage: $249.49 before deductible met, after deductible met $100.00 for 30 day supply bid. CM sent referral for patient to receive assistance with Lovenox injection administration due to hesitation from patient and sister about admin.  Referral to [facility] was denied 7/13 as patient did not meet their requirements for f/u. CM spoke with pt's PCP- they recommend patient remain  inpatient if unable to find assistance w/ injection admin.  -INR 1.6, CBC stable, Scr improving   Goal of Therapy:  INR 2-3 Monitor platelets by anticoagulation protocol: Yes   Plan:  Warfarin [id number] Lovenox 6075mQ q12h Daily PT/INR  MarDimple NanasharmD PGY-1 Acute Care Pharmacy Resident Office: [phone]/2021 10:51 AM  Please check [email or web address] for unit specific pharmacy phone numbers.

## 2020-05-09 LAB — GLUCOSE, CAPILLARY
Glucose-Capillary: 125 mg/dL — ABNORMAL HIGH (ref 70–99)
Glucose-Capillary: 169 mg/dL — ABNORMAL HIGH (ref 70–99)
Glucose-Capillary: 132 mg/dL — ABNORMAL HIGH (ref 70–99)
Glucose-Capillary: 201 mg/dL — ABNORMAL HIGH (ref 70–99)

## 2020-05-09 LAB — PROTIME-INR
INR: 1.6 — ABNORMAL HIGH (ref 0.8–1.2)
Prothrombin Time: 18.3 seconds — ABNORMAL HIGH (ref 11.4–15.2)

## 2020-05-09 MED ORDER — WARFARIN SODIUM 5 MG PO TABS
5.0000 mg | ORAL_TABLET | Freq: Once | ORAL | Status: AC
  Administered 2020-05-09: 5 mg via ORAL
  Filled 2020-05-09: qty 1

## 2020-05-09 NOTE — Progress Notes (Signed)
PROGRESS NOTE    Rhonda Contreras  DPO:[id number] DOB: 1951-07-08 DOA: 05/04/2020 PCP: Wenda Low, MD   Brief Narrative: 69 year old with past medical history significant for diabetes, hypertension, CVA, lupus anticoagulant on warfarin and CKD stage IIIa who presented with malaise and weakness for a few days.  Patient reports that recently while her sister was out of town, she did not feel like eating, so stopped eating for a while.  When her sister returned she found that the patient was weak and sluggish and she brought patient to the ED.  In the ER patient was noted to have an INR more than 10 and a creatinine at 2 from baseline of 0.9 calcium at 13.  Patient received vitamin K and was  started on IV fluids and admitted to the hospital for further care.   Assessment & Plan:   Principal Problem:   ARF (acute renal failure) (HCC) Active Problems:   Diabetes mellitus type 2 in nonobese [facility])   Essential hypertension   History of CVA (cerebrovascular accident)   Hypercalcemia   Coagulopathy (HCC)   Hypoglycemia   1-Acute renal failure due to dehydration: Received IV fluids Resolved.   2-Hypercalcemia due to dehydration: Resolved with IV fluids PTH low, vitamin D normal Follow-up PTH RP still pending   Supratherapeutic  INR Lupus anticoagulant on chronic warfarin Acquired thrombophilia -Lupus anticoagulant diagnosed at time of premature stroke age of 13 2009 on warfarin since. -She received 5 mg of vitamin K on admission INR still at 1.6 Continue with Lovenox bridge, continue with Coumadin. Patient is not able to inject herself.  Plan to discharge home when INR is therapeutic  Hypertension, cerebrovascular disease Continue with atorvastatin.  Resume blood pressure medication when blood pressure increases  Diabetes type 2 with nephropathy non-insulin-dependent: Resume glimepiride at discharge  Hypophosphatemia:hypokalemia; Replete orally. Repeat labs tomorrow.    Estimated body mass index is 31.44 kg/m as calculated from the following:   Height as of this encounter: 4\' 6"  (1.372 m).   Weight as of this encounter: 59.1 kg.   DVT prophylaxis: Lovenox Coumadin Code Status: Full code Family Communication: Care discussed with patient Disposition Plan:  Status is: Inpatient  Remains inpatient appropriate because:Inpatient level of care appropriate due to severity of illness patient require Lovenox injection she is not able to inject herself, require bridge with Coumadin due to high risk for stroke.   Dispo:  Patient From: Home  Planned Disposition: Home  Expected discharge date: 05/13/20  Medically stable for discharge: Yes         Consultants:   None  Procedures:   None  Antimicrobials:    Subjective: eating well, no new complaints.   Objective: Vitals:   05/08/20 2152 05/08/20 2152 05/09/20 0502 05/09/20 0900  BP: (!) 154/72 (!) 154/72 126/86 133/89  Pulse: 75 75 92 88  Resp: 18 18 19 18   Temp: 98 F (36.7 C) 98 F (36.7 C) 97.6 F (36.4 C) 98 F (36.7 C)  TempSrc: Oral Oral Oral Oral  SpO2: 96% 96% 98% 100%  Weight:      Height:        Intake/Output Summary (Last 24 hours) at 05/09/2020 1238 Last data filed at 05/09/2020 0800 Gross per 24 hour  Intake 940 ml  Output 0 ml  Net 940 ml   Filed Weights   05/06/20 0430 05/06/20 2022 05/07/20 2025  Weight: 59.1 kg 59.2 kg 59.1 kg    Examination:  General exam: NAD Respiratory system: CTA  Cardiovascular system: S 1, S 2 RRR Gastrointestinal system: BS present, soft, nt Central nervous system: Non focal.  Extremities: Symmetric power   Data Reviewed: I have personally reviewed following labs and imaging studies  CBC: Recent Labs  Lab 05/04/20 1219 05/05/20 0742 05/07/20 0223  WBC 12.4* 10.9* 8.2  NEUTROABS  --  7.4  --   HGB 14.2 12.5 12.1  HCT 44.6 38.6 37.4  MCV 89.2 88.5 90.8  PLT 260 213 665   Basic Metabolic Panel: Recent Labs  Lab  05/04/20 1219 05/04/20 2217 05/05/20 0742 05/05/20 1556 05/06/20 0623 05/08/20 0922  NA 137  --  137 141 141 140  K 4.0  --  3.5 3.9 3.5 3.4*  CL 97*  --  104 110 109 106  CO2 24  --  23 25 22 23   GLUCOSE 165*  --  159* 160* 133* 204*  BUN 71*  --  54* 43* 23 13  CREATININE 2.08*  --  1.35* 1.27* 0.94 0.86  CALCIUM 13.7* 11.2* 10.8* 9.3 8.9 8.2*  MG 1.8  --   --   --   --   --   PHOS  --  3.5  --   --  1.6* 1.2*   GFR: Estimated Creatinine Clearance: 40.4 mL/min (by C-G formula based on SCr of 0.86 mg/dL). Liver Function Tests: Recent Labs  Lab 05/04/20 1219 05/05/20 0742 05/06/20 0623  AST 23 17  --   ALT 16 15  --   ALKPHOS 56 50  --   BILITOT 0.8 1.0  --   PROT 7.7 6.2*  --   ALBUMIN 3.6 3.1* 3.0*   Recent Labs  Lab 05/04/20 1219  LIPASE 60*   No results for input(s): AMMONIA in the last 168 hours. Coagulation Profile: Recent Labs  Lab 05/05/20 1556 05/06/20 0623 05/07/20 0223 05/08/20 0922 05/09/20 0900  INR 1.7* 1.3* 1.9* 1.6* 1.6*   Cardiac Enzymes: No results for input(s): CKTOTAL, CKMB, CKMBINDEX, TROPONINI in the last 168 hours. BNP (last 3 results) No results for input(s): PROBNP in the last 8760 hours. HbA1C: No results for input(s): HGBA1C in the last 72 hours. CBG: Recent Labs  Lab 05/08/20 1105 05/08/20 1612 05/08/20 2154 05/09/20 0631 05/09/20 1130  GLUCAP 192* 157* 174* 125* 169*   Lipid Profile: No results for input(s): CHOL, HDL, LDLCALC, TRIG, CHOLHDL, LDLDIRECT in the last 72 hours. Thyroid Function Tests: No results for input(s): TSH, T4TOTAL, FREET4, T3FREE, THYROIDAB in the last 72 hours. Anemia Panel: No results for input(s): VITAMINB12, FOLATE, FERRITIN, TIBC, IRON, RETICCTPCT in the last 72 hours. Sepsis Labs: No results for input(s): PROCALCITON, LATICACIDVEN in the last 168 hours.  Recent Results (from the past 240 hour(s))  SARS Coronavirus 2 by RT PCR (hospital order, performed in [facility] hospital lab)  Nasopharyngeal Nasopharyngeal Swab     Status: None   Collection Time: 05/04/20  4:33 PM   Specimen: Nasopharyngeal Swab  Result Value Ref Range Status   SARS Coronavirus 2 NEGATIVE NEGATIVE Final    Comment: (NOTE) SARS-CoV-2 target nucleic acids are NOT DETECTED.  The SARS-CoV-2 RNA is generally detectable in upper and lower respiratory specimens during the acute phase of infection. The lowest concentration of SARS-CoV-2 viral copies this assay can detect is 250 copies / mL. A negative result does not preclude SARS-CoV-2 infection and should not be used as the sole basis for treatment or other patient management decisions.  A negative result may occur with improper specimen collection /  handling, submission of specimen other than nasopharyngeal swab, presence of viral mutation(s) within the areas targeted by this assay, and inadequate number of viral copies (<250 copies / mL). A negative result must be combined with clinical observations, patient history, and epidemiological information.  Fact Sheet for Patients:   StrictlyIdeas.no  Fact Sheet for Healthcare Providers: BankingDealers.co.za  This test is not yet approved or  cleared by the [place] FDA and has been authorized for detection and/or diagnosis of SARS-CoV-2 by FDA under an Emergency Use Authorization (EUA).  This EUA will remain in effect (meaning this test can be used) for the duration of the COVID-19 declaration under Section 564(b)(1) of the Act, 21 U.S.C. section 360bbb-3(b)(1), unless the authorization is terminated or revoked sooner.  Performed at [facility], [place] [address], [place], [place] [address]          Radiology Studies: No results found.      Scheduled Meds: . atorvastatin  20 mg Oral Daily  . enoxaparin (LOVENOX) injection  60 mg Subcutaneous Q12H  . metoprolol tartrate  50 mg Oral BID  . phosphorus  500 mg Oral TID  .  tobramycin  1 drop Left Eye QID  . warfarin  5 mg Oral ONCE-1600  . Warfarin - Pharmacist Dosing Inpatient   Does not apply q1600   Continuous Infusions:   LOS: 4 days    Time spent: 35 minutes    Admire Bunnell A Lakisha Peyser, MD [facility]   If 7PM-7AM, please contact night-coverage [email or web address]  05/09/2020, 12:38 PM

## 2020-05-09 NOTE — Discharge Instructions (Signed)

## 2020-05-09 NOTE — Progress Notes (Signed)
ANTICOAGULATION CONSULT NOTE - Follow Up Consult  Pharmacy Consult for lovenox, warfarin Indication: lupus anticoagulant  Allergies  Allergen Reactions  . Tape Other (See Comments)    CERTAIN MEDICAL TAPES HURT AND BURN THE SKIN    Patient Measurements: Height: _0  (137.2 cm) Weight: 59.1 kg (130 lb 6.4 oz) IBW/kg (Calculated) : 31.7  Vital Signs: Temp: 98 F (36.7 C) (07/16 0900) Temp Source: Oral (07/16 0900) BP: 133/89 (07/16 0900) Pulse Rate: 88 (07/16 0900)  Labs: Recent Labs    05/07/20 0223 05/08/20 0922 05/09/20 0900  HGB 12.1  --   --   HCT 37.4  --   --   PLT 175  --   --   LABPROT 20.7* 18.2* 18.3*  INR 1.9* 1.6* 1.6*  CREATININE  --  0.86  --     Estimated Creatinine Clearance: 40.4 mL/min (by C-G formula based on SCr of 0.86 mg/dL).   Medical History: Past Medical History:  Diagnosis Date  . Cancer ([place])    skin  . Diabetes mellitus without complication ([place])   . Hypertension   . Immune deficiency disorder ([place])   . Lupus (systemic lupus erythematosus) ([place])   . Renal disorder    Stage 2 KD  . Stroke [facility])    2009    Medications:  See medication history  Assessment: 89 YOF with history of lupus anticoagulant on warfarin. PTA dose 2.5 mg daily except 5 mg daily on Monday and Friday. Admission INR >10. Patient s/p 2 doses of vitamin K 2.49m IV (7/11 & 7/12). Patient is being bridged with Lovenox.   TOC consult for Lovenox coverage: $249.49 before deductible met, after deductible met $100.00 for 30 day supply bid. CM sent referral for patient to receive assistance with Lovenox injection administration due to hesitation from patient and sister about admin.  Referral to [facility] was denied 7/13 as patient did not meet their requirements for f/u. CM spoke with pt's PCP- they recommend patient remain inpatient if unable to find assistance w/ injection admin.  -INR 1.6 on 7/15>remains at 1.6 today despite giving higher warfarin doses than her  usual PTA regimen. No drug-drug interaction noted. CBC stable, Scr improved to within normal limits. No bleeding noted. Eating 25-100% meals On lovenox bridge 623mq12h, pt unable to self administer and no one at home to give injections per MD.   Goal of Therapy:  INR 2-3 Monitor platelets by anticoagulation protocol: Yes   Plan:  Warfarin 41m21m1 (a dose increase from PTA dose) Continue Lovenox 53m30m q12h Daily PT/INR  RuthNicole Cellah Clinical Pharmacist [phone] check AMION for all [facility] numbers After 10:00 PM, call [facility] [place]-[phone]/2021 10:22 AM  Please check [email or web address] for unit specific pharmacy phone numbers.

## 2020-05-10 LAB — PROTIME-INR
INR: 2 — ABNORMAL HIGH (ref 0.8–1.2)
Prothrombin Time: 21.6 seconds — ABNORMAL HIGH (ref 11.4–15.2)

## 2020-05-10 LAB — BASIC METABOLIC PANEL
Anion gap: 10 (ref 5–15)
BUN: 12 mg/dL (ref 8–23)
CO2: 22 mmol/L (ref 22–32)
Calcium: 7 mg/dL — ABNORMAL LOW (ref 8.9–10.3)
Chloride: 107 mmol/L (ref 98–111)
Creatinine, Ser: 0.63 mg/dL (ref 0.44–1.00)
GFR calc Af Amer: >60 mL/min (ref >60)
GFR calc non Af Amer: >60 mL/min (ref >60)
Glucose, Bld: 130 mg/dL — ABNORMAL HIGH (ref 70–99)
Potassium: 3.2 mmol/L — ABNORMAL LOW (ref 3.5–5.1)
Sodium: 139 mmol/L (ref 135–145)

## 2020-05-10 LAB — GLUCOSE, CAPILLARY: Glucose-Capillary: 115 mg/dL — ABNORMAL HIGH (ref 70–99)

## 2020-05-10 LAB — PHOSPHORUS: Phosphorus: 2.4 mg/dL — ABNORMAL LOW (ref 2.5–4.6)

## 2020-05-10 MED ORDER — POTASSIUM CHLORIDE CRYS ER 20 MEQ PO TBCR
40.0000 meq | EXTENDED_RELEASE_TABLET | Freq: Once | ORAL | Status: AC
  Administered 2020-05-10: 40 meq via ORAL
  Filled 2020-05-10: qty 2

## 2020-05-10 MED ORDER — K PHOS MONO-SOD PHOS DI & MONO 155-852-130 MG PO TABS: 500.0000 mg | ORAL_TABLET | Freq: Two times a day (BID) | ORAL | 0 refills | Status: DC

## 2020-05-10 MED ORDER — METOPROLOL TARTRATE 50 MG PO TABS: 50.0000 mg | ORAL_TABLET | Freq: Two times a day (BID) | ORAL | 0 refills | Status: DC

## 2020-05-10 MED ORDER — WARFARIN SODIUM 5 MG PO TABS: 2.5000 mg | ORAL_TABLET | Freq: Every day | ORAL | 0 refills | Status: DC

## 2020-05-10 MED ORDER — K PHOS MONO-SOD PHOS DI & MONO 155-852-130 MG PO TABS
500.0000 mg | ORAL_TABLET | Freq: Two times a day (BID) | ORAL | Status: DC
  Administered 2020-05-10: 500 mg via ORAL
  Filled 2020-05-10 (×2): qty 2

## 2020-05-10 MED ORDER — WARFARIN SODIUM 2.5 MG PO TABS: 2.5000 mg | ORAL_TABLET | Freq: Once | ORAL | Status: DC

## 2020-05-10 NOTE — Progress Notes (Signed)
ANTICOAGULATION CONSULT NOTE - Follow Up Consult  Pharmacy Consult for lovenox, warfarin Indication: lupus anticoagulant  Allergies  Allergen Reactions  . Tape Other (See Comments)    CERTAIN MEDICAL TAPES HURT AND BURN THE SKIN    Patient Measurements: Height: _0  (137.2 cm) Weight: 58.4 kg (128 lb 12 oz) IBW/kg (Calculated) : 31.7  Vital Signs: Temp: 98.1 F (36.7 C) (07/17 0557) Temp Source: Oral (07/17 0557) BP: 150/81 (07/17 0557) Pulse Rate: 83 (07/17 0557)  Labs: Recent Labs    05/08/20 0922 05/09/20 0900 05/10/20 0704  LABPROT 18.2* 18.3* 21.6*  INR 1.6* 1.6* 2.0*  CREATININE 0.86  --  0.63    Estimated Creatinine Clearance: 43.2 mL/min (by C-G formula based on SCr of 0.63 mg/dL).   Medical History: Past Medical History:  Diagnosis Date  . Cancer ([place])    skin  . Diabetes mellitus without complication ([place])   . Hypertension   . Immune deficiency disorder ([place])   . Lupus (systemic lupus erythematosus) ([place])   . Renal disorder    Stage 2 KD  . Stroke [facility])    2009    Medications:  See medication history  Assessment: 38 YOF with history of lupus anticoagulant on warfarin. PTA dose 2.5 mg daily except 5 mg daily on Monday and Friday. Admission INR >10. Patient s/p 2 doses of vitamin K 2.41m IV (7/11 & 7/12). Patient is being bridged with Lovenox.   TOC consult for Lovenox coverage: $249.49 before deductible met, after deductible met $100.00 for 30 day supply bid. CM sent referral for patient to receive assistance with Lovenox injection administration due to hesitation from patient and sister about admin.  Referral to [facility] was denied 7/13 as patient did not meet their requirements for f/u. CM spoke with pt's PCP- they recommend patient remain inpatient if unable to find assistance w/ injection admin.  INR back in therapeutic range today at 2. Would continue lovenox bridge if she is here for another day. If she is dc, can put her on coumadin  2.524mPO daily and check INR on Tues.   Goal of Therapy:  INR 2-3 Monitor platelets by anticoagulation protocol: Yes   Plan:  Warfarin 2.77m53m1  Continue Lovenox 54m777m q12h for now Daily PT/INR If dc home today, put on coumadin 2.77mg 87mqday & check INR Tues  Merl Guardino Onnie BoerrmD, BCIDP, AAHIVP, CPP Infectious Disease Pharmacist 05/10/2020 9:14 AM

## 2020-05-10 NOTE — Discharge Summary (Signed)
Physician Discharge Summary  Rhonda Contreras HYW:[id number] DOB: January 30, 1951 DOA: 05/04/2020  PCP: Wenda Low, MD  Admit date: 05/04/2020 Discharge date: 05/10/2020  Admitted From: Home  Disposition:  Home   Recommendations for Outpatient Follow-up:  1. Follow up with PCP in 1-2 weeks 2. Please obtain BMP/CBC in one week 3. Need INR check on Tuesday.  4. Check phosphorus level.  5. Follow results PTH RP    Discharge Condition: Stable.  CODE STATUS: Full code Diet recommendation: Heart Healthy   Brief/Interim Summary: 69 year old with past medical history significant for diabetes, hypertension, CVA, lupus anticoagulant on warfarin and CKD stage IIIa who presented with malaise and weakness for a few days.  Patient reports that recently while her sister was out of town, she did not feel like eating, so stopped eating for a while.  When her sister returned she found that the patient was weak and sluggish and she brought patient to the ED.  In the ER patient was noted to have an INR more than 10 and a creatinine at 2 from baseline of 0.9 calcium at 13.  Patient received vitamin K and was  started on IV fluids and admitted to the hospital for further care.  1-Acute renal failure due to dehydration: Received IV fluids Resolved.   2-Hypercalcemia due to dehydration: Resolved with IV fluids PTH low, vitamin D normal Follow-up PTH RP still pending   Supratherapeutic  INR Lupus anticoagulant on chronic warfarin Acquired thrombophilia -Lupus anticoagulant diagnosed at time of premature stroke age of 69 2009 on warfarin since. -She received 5 mg of vitamin K on admission Continue with Lovenox bridge, continue with Coumadin. Patient is not able to inject herself.  Plan to discharge home when INR is therapeutic INR at 2.  Discussed with pharmacist, plan to discharge on 2.5 mg coumadin daily.   Hypertension, cerebrovascular disease Continue with atorvastatin.  Resume blood pressure  medication when blood pressure increases  Diabetes type 2 with nephropathy non-insulin-dependent: Resume glimepiride at discharge  Hypophosphatemia:hypokalemia; Replete orally. will provide supplement at discharge   Discharge Diagnoses:  Principal Problem:   ARF (acute renal failure) ([place]) Active Problems:   Diabetes mellitus type 2 in nonobese [facility])   Essential hypertension   History of CVA (cerebrovascular accident)   Hypercalcemia   Coagulopathy ([facility])   Hypoglycemia    Discharge Instructions  Discharge Instructions    Diet - low sodium heart healthy   Complete by: As directed    Discharge instructions   Complete by: As directed    From Dr. Loleta Books: You were admitted due to dehydration. As a result of this, your kidney function was temporarily worse and your calcium was very high.  Also, your INR (warfarin level) was extremely high.  Thankfully, with fluids, these got a lot better.  Go to Dr. Glenna Durand office for labs this Thursday  For now, take metoprolol 100 mg twice daily as usual DO NOT take your amlodipine, lisinopril or HCTZ until Dr. Lysle Rubens tells you to restart on Thursday  Resume your warfarin Take warfarin 5 mg tonight (Tuesday) and then resume your home regimen of 2.5 mg every day except Monday and Friday See Dr. Lysle Rubens for INR check on Thursday Adjust your warfarin as he recommends  Until your INR is back to 2 -- take enoxaparin 60 mg twice daily   Increase activity slowly   Complete by: As directed    Increase activity slowly   Complete by: As directed      Allergies  as of 05/10/2020      Reactions   Tape Other (See Comments)   CERTAIN MEDICAL TAPES HURT AND BURN THE SKIN      Medication List    STOP taking these medications   HYDROcodone-acetaminophen 5-325 MG tablet Commonly known as: Norco   lisinopril-hydrochlorothiazide 10-12.5 MG tablet Commonly known as: ZESTORETIC     TAKE these medications   acetaminophen 325 MG  tablet Commonly known as: TYLENOL Take 325-650 mg by mouth every 6 (six) hours as needed for mild pain or headache.   alendronate 70 MG tablet Commonly known as: FOSAMAX Take 70 mg by mouth every Saturday.   amLODipine 10 MG tablet Commonly known as: NORVASC Take 10 mg by mouth daily.   atorvastatin 20 MG tablet Commonly known as: LIPITOR Take 20 mg by mouth daily.   docusate sodium 100 MG capsule Commonly known as: Colace Take 1 capsule (100 mg total) by mouth 2 (two) times daily. To prevent constipation while taking pain medication.   glimepiride 1 MG tablet Commonly known as: AMARYL Take 1 mg by mouth daily.   ketoconazole 2 % cream Commonly known as: NIZORAL Apply 1 application topically daily. On her face   loratadine 10 MG tablet Commonly known as: CLARITIN Take 10 mg by mouth in the morning.   metFORMIN 1000 MG tablet Commonly known as: GLUCOPHAGE Take 1,000 mg by mouth at bedtime.   metoprolol tartrate 50 MG tablet Commonly known as: LOPRESSOR Take 1 tablet (50 mg total) by mouth 2 (two) times daily. What changed:   medication strength  how much to take   phosphorus 155-852-130 MG tablet Commonly known as: K PHOS NEUTRAL Take 2 tablets (500 mg total) by mouth 2 (two) times daily.   polyethylene glycol 17 g packet Commonly known as: MIRALAX / GLYCOLAX Take 17 g by mouth daily. What changed:   when to take this  reasons to take this   tobramycin 0.3 % ophthalmic solution Commonly known as: TOBREX Place 1 drop into the left eye 4 (four) times daily.   warfarin 5 MG tablet Commonly known as: COUMADIN Take 0.5 tablets (2.5 mg total) by mouth daily. What changed:   how much to take  when to take this  additional instructions       Follow-up Information    Wenda Low, MD. Go in 2 day(s).   Specialty: Internal Medicine Contact information: [address] [place] [address] [phone]        [facility] Follow up.   Specialty: Home Health Services Why: the office will call to schedule home health visits Contact information: [address] [place] [address] [phone]              Allergies  Allergen Reactions  . Tape Other (See Comments)    CERTAIN MEDICAL TAPES HURT AND BURN THE SKIN    Consultations:  none   Procedures/Studies: CT Head Wo Contrast  Result Date: 05/04/2020 CLINICAL DATA:  Focal neuro deficit.  Generalized weakness. EXAM: CT HEAD WITHOUT CONTRAST TECHNIQUE: Contiguous axial images were obtained from the base of the skull through the vertex without intravenous contrast. COMPARISON:  May 18, 2008 FINDINGS: Brain: No subdural, epidural, or subarachnoid hemorrhage. Moderate white matter changes are identified. Lacunar infarcts are seen in the right caudate head, right thalamus, the left thalamus, unchanged since 2009. There is a small right frontal infarct seen on coronal image 29. There is a small  left parietal infarct on axial image 21 not seen in 2009. No other infarcts identified. No acute ischemia noted. Ventricles and sulci are unremarkable. Lacunar infarcts are again seen in the bilateral cerebellar hemispheres, nonacute in appearance. Brainstem is normal. Basal cisterns are unremarkable. No mass effect or midline shift. Vascular: Calcified atherosclerosis is seen in the intracranial carotids Skull: Normal. Negative for fracture or focal lesion. Sinuses/Orbits: No acute finding. Other: None. IMPRESSION: 1. Small infarct in the right frontal lobe, favored to be nonacute. Small infarct in the left parietal lobe, also favored to be nonacute. Neither of these infarcts were seen in 2009. Moderate white matter changes and scattered lacunar infarcts in the basal ganglia and cerebellum are stable since 2009. No definitive acute intracranial abnormality identified on this study. Electronically Signed   By: Dorise Bullion III M.D   On: 05/04/2020 17:09   DG  Chest Portable 1 View  Result Date: 05/04/2020 CLINICAL DATA:  Altered level of consciousness, generalized weakness, anorexia EXAM: PORTABLE CHEST 1 VIEW COMPARISON:  02/01/2018 FINDINGS: The heart size and mediastinal contours are within normal limits. Both lungs are clear. The visualized skeletal structures are unremarkable. IMPRESSION: No active disease. Electronically Signed   By: Randa Ngo M.D.   On: 05/04/2020 17:41   CT RENAL STONE STUDY  Result Date: 05/05/2020 CLINICAL DATA:  Flank pain.  Evaluate for nephrolithiasis. EXAM: CT ABDOMEN AND PELVIS WITHOUT CONTRAST TECHNIQUE: Multidetector CT imaging of the abdomen and pelvis was performed following the standard protocol without IV contrast. COMPARISON:  None. FINDINGS: The lack of intravenous contrast limits the ability to evaluate solid abdominal organs. Lower chest: Limited visualization of the lower thorax demonstrates minimal bibasilar subsegmental atelectasis. No pleural effusion. Hepatobiliary: Normal hepatic contour. There is a punctate (3 mm) radiopaque gallstone within otherwise normal-appearing gallbladder. No gallbladder wall thickening or pericholecystic stranding. No ascites. Pancreas: Normal noncontrast appearance of the pancreas. Spleen: Normal noncontrast appearance of the spleen. Adrenals/Urinary Tract: There is a punctate (approximately 0.6 cm) hypoattenuating lesion involving the mid aspect the left kidney (axial image 23, series 3; coronal image 59, series 6), too small to adequately characterize though favored to represent a renal cyst. Calcifications about the right renal hila are favored to be vascular in etiology. No evidence of nephrolithiasis. No renal stones are seen along expected course of either ureter or the urinary bladder. Normal noncontrast appearance of the urinary bladder given degree of distention. No urinary obstruction or perinephric stranding. Normal noncontrast appearance of the bilateral adrenal glands.  Stomach/Bowel: Small hiatal hernia. There is mild circumferential bowel wall thickening involving the proximal descending colon (axial images 13 and 17, series 6; coronal images 40 and 42, series 6) as well as the sigmoid colon within the left lower abdomen/pelvis (image 56, series 3), not resulting in enteric obstruction. No associated mesenteric stranding. Normal noncontrast appearance of the terminal ileum. The appendix is not identified however there is no pericecal inflammatory change. No pneumoperitoneum, pneumatosis or portal venous gas. Vascular/Lymphatic: Moderate amount of atherosclerotic plaque within normal caliber abdominal aorta. No bulky retroperitoneal, mesenteric, pelvic or inguinal lymphadenopathy on this noncontrast examination. Reproductive: Dystrophic calcified fibroid measuring approximately 0.7 cm (sagittal image 62, series 7) within an otherwise expectedly atrophic uterus. No discrete adnexal lesions. No free fluid the pelvic cul-de-sac. Other: Tiny mesenteric fat containing periumbilical hernia. Musculoskeletal: No acute or aggressive osseous abnormalities. Severe DDD of T11-T12 with disc space height loss, endplate irregularity and sclerosis. Post intramedullary rod fixation of the left femur, incompletely evaluated.  IMPRESSION: 1. Mild circumferential bowel wall thickening involving the proximal descending colon as well as the sigmoid colon within the left lower abdomen/pelvis, not resulting in enteric obstruction, potentially artifactual due to underdistention and or peristalsis though conceivably an enteritis could have a similar appearance. 2. Otherwise, no explanation for patient's flank pain. Specifically, no evidence of nephrolithiasis or urinary obstruction. 3. Cholelithiasis without evidence of cholecystitis on this noncontrast examination. 4. Small hiatal hernia. 5.  Aortic Atherosclerosis (ICD10-I70.0). Electronically Signed   By: Sandi Mariscal M.D.   On: 05/05/2020 07:41      Subjective: Eating well, feels better.   Discharge Exam: Vitals:   05/10/20 0557 05/10/20 0900  BP: (!) 150/81 (!) 132/54  Pulse: 83 80  Resp: 16 18  Temp: 98.1 F (36.7 C) 98.6 F (37 C)  SpO2: 98% 98%     General: Pt is alert, awake, not in acute distress Cardiovascular: RRR, S1/S2 +, no rubs, no gallops Respiratory: CTA bilaterally, no wheezing, no rhonchi Abdominal: Soft, NT, ND, bowel sounds + Extremities: no edema, no cyanosis    The results of significant diagnostics from this hospitalization (including imaging, microbiology, ancillary and laboratory) are listed below for reference.     Microbiology: Recent Results (from the past 240 hour(s))  SARS Coronavirus 2 by RT PCR (hospital order, performed in [facility] hospital lab) Nasopharyngeal Nasopharyngeal Swab     Status: None   Collection Time: 05/04/20  4:33 PM   Specimen: Nasopharyngeal Swab  Result Value Ref Range Status   SARS Coronavirus 2 NEGATIVE NEGATIVE Final    Comment: (NOTE) SARS-CoV-2 target nucleic acids are NOT DETECTED.  The SARS-CoV-2 RNA is generally detectable in upper and lower respiratory specimens during the acute phase of infection. The lowest concentration of SARS-CoV-2 viral copies this assay can detect is 250 copies / mL. A negative result does not preclude SARS-CoV-2 infection and should not be used as the sole basis for treatment or other patient management decisions.  A negative result may occur with improper specimen collection / handling, submission of specimen other than nasopharyngeal swab, presence of viral mutation(s) within the areas targeted by this assay, and inadequate number of viral copies (<250 copies / mL). A negative result must be combined with clinical observations, patient history, and epidemiological information.  Fact Sheet for Patients:   StrictlyIdeas.no  Fact Sheet for Healthcare  Providers: BankingDealers.co.za  This test is not yet approved or  cleared by the [place] FDA and has been authorized for detection and/or diagnosis of SARS-CoV-2 by FDA under an Emergency Use Authorization (EUA).  This EUA will remain in effect (meaning this test can be used) for the duration of the COVID-19 declaration under Section 564(b)(1) of the Act, 21 U.S.C. section 360bbb-3(b)(1), unless the authorization is terminated or revoked sooner.  Performed at [facility], [place] [address], [place], [place] [address]      Labs: BNP (last 3 results) No results for input(s): BNP in the last 8760 hours. Basic Metabolic Panel: Recent Labs  Lab 05/04/20 1219 05/04/20 1219 05/04/20 2217 05/05/20 0742 05/05/20 1556 05/06/20 0623 05/08/20 0922 05/10/20 0704  NA 137   < >  --  137 141 141 140 139  K 4.0   < >  --  3.5 3.9 3.5 3.4* 3.2*  CL 97*   < >  --  104 110 109 106 107  CO2 24   < >  --  23 25 22 23 22   GLUCOSE 165*   < >  --  159* 160* 133* 204* 130*  BUN 71*   < >  --  54* 43* 23 13 12   CREATININE 2.08*   < >  --  1.35* 1.27* 0.94 0.86 0.63  CALCIUM 13.7*   < > 11.2* 10.8* 9.3 8.9 8.2* 7.0*  MG 1.8  --   --   --   --   --   --   --   PHOS  --   --  3.5  --   --  1.6* 1.2* 2.4*   < > = values in this interval not displayed.   Liver Function Tests: Recent Labs  Lab 05/04/20 1219 05/05/20 0742 05/06/20 0623  AST 23 17  --   ALT 16 15  --   ALKPHOS 56 50  --   BILITOT 0.8 1.0  --   PROT 7.7 6.2*  --   ALBUMIN 3.6 3.1* 3.0*   Recent Labs  Lab 05/04/20 1219  LIPASE 60*   No results for input(s): AMMONIA in the last 168 hours. CBC: Recent Labs  Lab 05/04/20 1219 05/05/20 0742 05/07/20 0223  WBC 12.4* 10.9* 8.2  NEUTROABS  --  7.4  --   HGB 14.2 12.5 12.1  HCT 44.6 38.6 37.4  MCV 89.2 88.5 90.8  PLT 260 213 175   Cardiac Enzymes: No results for input(s): CKTOTAL, CKMB, CKMBINDEX, TROPONINI in the last 168  hours. BNP: Invalid input(s): POCBNP CBG: Recent Labs  Lab 05/09/20 0631 05/09/20 1130 05/09/20 1606 05/09/20 2056 05/10/20 0637  GLUCAP 125* 169* 132* 201* 115*   D-Dimer No results for input(s): DDIMER in the last 72 hours. Hgb A1c No results for input(s): HGBA1C in the last 72 hours. Lipid Profile No results for input(s): CHOL, HDL, LDLCALC, TRIG, CHOLHDL, LDLDIRECT in the last 72 hours. Thyroid function studies No results for input(s): TSH, T4TOTAL, T3FREE, THYROIDAB in the last 72 hours.  Invalid input(s): FREET3 Anemia work up No results for input(s): VITAMINB12, FOLATE, FERRITIN, TIBC, IRON, RETICCTPCT in the last 72 hours. Urinalysis    Component Value Date/Time   COLORURINE YELLOW 05/05/2020 0307   APPEARANCEUR CLEAR 05/05/2020 0307   LABSPEC 1.017 05/05/2020 0307   PHURINE 5.0 05/05/2020 0307   GLUCOSEU 50 (A) 05/05/2020 0307   HGBUR NEGATIVE 05/05/2020 0307   BILIRUBINUR NEGATIVE 05/05/2020 0307   KETONESUR 5 (A) 05/05/2020 0307   PROTEINUR NEGATIVE 05/05/2020 0307   UROBILINOGEN 1.0 05/16/2008 0330   NITRITE NEGATIVE 05/05/2020 0307   LEUKOCYTESUR SMALL (A) 05/05/2020 0307   Sepsis Labs Invalid input(s): PROCALCITONIN,  WBC,  LACTICIDVEN Microbiology Recent Results (from the past 240 hour(s))  SARS Coronavirus 2 by RT PCR (hospital order, performed in [place] hospital lab) Nasopharyngeal Nasopharyngeal Swab     Status: None   Collection Time: 05/04/20  4:33 PM   Specimen: Nasopharyngeal Swab  Result Value Ref Range Status   SARS Coronavirus 2 NEGATIVE NEGATIVE Final    Comment: (NOTE) SARS-CoV-2 target nucleic acids are NOT DETECTED.  The SARS-CoV-2 RNA is generally detectable in upper and lower respiratory specimens during the acute phase of infection. The lowest concentration of SARS-CoV-2 viral copies this assay can detect is 250 copies / mL. A negative result does not preclude SARS-CoV-2 infection and should not be used as the sole basis  for treatment or other patient management decisions.  A negative result may occur with improper specimen collection / handling, submission of specimen other than nasopharyngeal swab, presence of viral mutation(s) within the areas targeted by  this assay, and inadequate number of viral copies (<250 copies / mL). A negative result must be combined with clinical observations, patient history, and epidemiological information.  Fact Sheet for Patients:   StrictlyIdeas.no  Fact Sheet for Healthcare Providers: BankingDealers.co.za  This test is not yet approved or  cleared by the [place] FDA and has been authorized for detection and/or diagnosis of SARS-CoV-2 by FDA under an Emergency Use Authorization (EUA).  This EUA will remain in effect (meaning this test can be used) for the duration of the COVID-19 declaration under Section 564(b)(1) of the Act, 21 U.S.C. section 360bbb-3(b)(1), unless the authorization is terminated or revoked sooner.  Performed at [facility], [place] [address], [place], [place] [address]      Time coordinating discharge: 40 minutes  SIGNED:   Elmarie Shiley, MD  [facility]

## 2020-05-10 NOTE — Progress Notes (Signed)
DISCHARGE NOTE HOME Rhonda Contreras to be discharged to home per MD order. Discussed prescriptions and follow up appointments with the patient and with her sster. [name]. Prescriptions given to patient and her sister; medication list explained in detail. Patient and her sister verbalized understanding.  Skin clean, dry and intact without evidence of skin break down, no evidence of skin tears noted. IV catheter discontinued intact. Site without signs and symptoms of complications. Dressing and pressure applied. Pt denies pain at the site currently. No complaints noted.  Patient free of lines, drains, and wounds.   An After Visit Summary (AVS) was printed and given to the patient. Patient escorted via wheelchair, and discharged home via private auto.  Hunt, Zenon Mayo, RN

## 2020-05-12 ENCOUNTER — Other Ambulatory Visit: Payer: Self-pay

## 2020-05-13 ENCOUNTER — Other Ambulatory Visit: Payer: Self-pay | Admitting: *Deleted

## 2020-05-13 ENCOUNTER — Encounter: Payer: Self-pay | Admitting: *Deleted

## 2020-05-13 DIAGNOSIS — Z7901 Long term (current) use of anticoagulants: Secondary | ICD-10-CM | POA: Diagnosis not present

## 2020-05-13 LAB — PTH-RELATED PEPTIDE: PTH-related peptide: <2 pmol/L

## 2020-05-13 NOTE — Patient Outreach (Signed)
[facility]) Care Management  05/13/2020  Rhonda Contreras 03/22/1951 [id number]   Referral Date: 7/19 Referral Source: Hospital Liaison Referral Reason: post discharge Insurance: [place] attempt #1, successful to sister Rhonda Contreras identity verified.  This care manager introduced self and stated purpose of call.  [facility] care management services explained.  Social: Sister report that member lives alone and is independent in all ADL's but she lives close to her and visits at least weekly, sometimes more.  She is legally blind but able to manage her care with some help.  Member will have [facility] for home health starting this week.  Conditions: Per chart, she has history of diabetes, CVA, and HTN.  She does monitor her blood sugars but does not monitor her blood pressure daily.  Blood sugar today was 146, Metformin has been on hold.  Medications: Reviewed with sister, report member is taking as instructed.  She fills the pill box weekly.  Verbalizes understanding regarding medication changes per discharge.  Appointments: Will have follow up with PCP on 7/22, sister will provide transportation.  Plan: RN CM will follow up with member/sister within the next 2 weeks.  Goals Addressed              This Visit's Progress   .  Per sister: Stay out of the hospital and remain independent (pt-stated)        CARE PLAN ENTRY (see longitudinal plan of care for additional care plan information)  Current Barriers:  . Chronic Disease Management support and education needs related to Dehydration . Lacks caregiver support.   Nurse Case Manager Clinical Goal(s):  Marland Kitchen Over the next 31 days, patient will not experience hospital admission. Hospital Admissions in last 6 months = 1 . Over the next 28 days, patient will attend all scheduled medical appointments: with PCP . Over the next 28 days, patient will demonstrate understanding of rationale for each prescribed  medication as evidenced by compliance  Interventions:  . Inter-disciplinary care team collaboration (see longitudinal plan of care) . Advised patient to contact MD and/or this RNCM with questions/concerns . Reviewed medications with patient and discussed changes per discharge instructions . Discussed plans with patient for ongoing care management follow up and provided patient with direct contact information for care management team . Reviewed scheduled/upcoming provider appointments including:   Patient Self Care Activities:  . Attends all scheduled provider appointments . Performs ADL's independently . Calls provider office for new concerns or questions . Unable to self administer medications as prescribed . Lacks social connections  Initial goal documentation       [name], Therapist, sports, MSN [place] [phone]

## 2020-05-15 DIAGNOSIS — I10 Essential (primary) hypertension: Secondary | ICD-10-CM | POA: Diagnosis not present

## 2020-05-15 DIAGNOSIS — Z7984 Long term (current) use of oral hypoglycemic drugs: Secondary | ICD-10-CM | POA: Diagnosis not present

## 2020-05-15 DIAGNOSIS — Z7901 Long term (current) use of anticoagulants: Secondary | ICD-10-CM | POA: Diagnosis not present

## 2020-05-15 DIAGNOSIS — N183 Chronic kidney disease, stage 3 unspecified: Secondary | ICD-10-CM | POA: Diagnosis not present

## 2020-05-15 DIAGNOSIS — N179 Acute kidney failure, unspecified: Secondary | ICD-10-CM | POA: Diagnosis not present

## 2020-05-15 DIAGNOSIS — E1122 Type 2 diabetes mellitus with diabetic chronic kidney disease: Secondary | ICD-10-CM | POA: Diagnosis not present

## 2020-05-16 DIAGNOSIS — I700 Atherosclerosis of aorta: Secondary | ICD-10-CM | POA: Diagnosis not present

## 2020-05-16 DIAGNOSIS — E1122 Type 2 diabetes mellitus with diabetic chronic kidney disease: Secondary | ICD-10-CM | POA: Diagnosis not present

## 2020-05-16 DIAGNOSIS — M329 Systemic lupus erythematosus, unspecified: Secondary | ICD-10-CM | POA: Diagnosis not present

## 2020-05-16 DIAGNOSIS — D849 Immunodeficiency, unspecified: Secondary | ICD-10-CM | POA: Diagnosis not present

## 2020-05-16 DIAGNOSIS — I129 Hypertensive chronic kidney disease with stage 1 through stage 4 chronic kidney disease, or unspecified chronic kidney disease: Secondary | ICD-10-CM | POA: Diagnosis not present

## 2020-05-16 DIAGNOSIS — I4891 Unspecified atrial fibrillation: Secondary | ICD-10-CM | POA: Diagnosis not present

## 2020-05-16 DIAGNOSIS — M5134 Other intervertebral disc degeneration, thoracic region: Secondary | ICD-10-CM | POA: Diagnosis not present

## 2020-05-16 DIAGNOSIS — D689 Coagulation defect, unspecified: Secondary | ICD-10-CM | POA: Diagnosis not present

## 2020-05-16 DIAGNOSIS — K449 Diaphragmatic hernia without obstruction or gangrene: Secondary | ICD-10-CM | POA: Diagnosis not present

## 2020-05-16 DIAGNOSIS — N1831 Chronic kidney disease, stage 3a: Secondary | ICD-10-CM | POA: Diagnosis not present

## 2020-05-19 DIAGNOSIS — D849 Immunodeficiency, unspecified: Secondary | ICD-10-CM | POA: Diagnosis not present

## 2020-05-19 DIAGNOSIS — I700 Atherosclerosis of aorta: Secondary | ICD-10-CM | POA: Diagnosis not present

## 2020-05-19 DIAGNOSIS — K449 Diaphragmatic hernia without obstruction or gangrene: Secondary | ICD-10-CM | POA: Diagnosis not present

## 2020-05-19 DIAGNOSIS — D689 Coagulation defect, unspecified: Secondary | ICD-10-CM | POA: Diagnosis not present

## 2020-05-19 DIAGNOSIS — I4891 Unspecified atrial fibrillation: Secondary | ICD-10-CM | POA: Diagnosis not present

## 2020-05-19 DIAGNOSIS — N1831 Chronic kidney disease, stage 3a: Secondary | ICD-10-CM | POA: Diagnosis not present

## 2020-05-19 DIAGNOSIS — I129 Hypertensive chronic kidney disease with stage 1 through stage 4 chronic kidney disease, or unspecified chronic kidney disease: Secondary | ICD-10-CM | POA: Diagnosis not present

## 2020-05-19 DIAGNOSIS — M5134 Other intervertebral disc degeneration, thoracic region: Secondary | ICD-10-CM | POA: Diagnosis not present

## 2020-05-19 DIAGNOSIS — M329 Systemic lupus erythematosus, unspecified: Secondary | ICD-10-CM | POA: Diagnosis not present

## 2020-05-19 DIAGNOSIS — E1122 Type 2 diabetes mellitus with diabetic chronic kidney disease: Secondary | ICD-10-CM | POA: Diagnosis not present

## 2020-05-21 DIAGNOSIS — D849 Immunodeficiency, unspecified: Secondary | ICD-10-CM | POA: Diagnosis not present

## 2020-05-21 DIAGNOSIS — I129 Hypertensive chronic kidney disease with stage 1 through stage 4 chronic kidney disease, or unspecified chronic kidney disease: Secondary | ICD-10-CM | POA: Diagnosis not present

## 2020-05-21 DIAGNOSIS — M329 Systemic lupus erythematosus, unspecified: Secondary | ICD-10-CM | POA: Diagnosis not present

## 2020-05-21 DIAGNOSIS — N1831 Chronic kidney disease, stage 3a: Secondary | ICD-10-CM | POA: Diagnosis not present

## 2020-05-21 DIAGNOSIS — I4891 Unspecified atrial fibrillation: Secondary | ICD-10-CM | POA: Diagnosis not present

## 2020-05-21 DIAGNOSIS — E1122 Type 2 diabetes mellitus with diabetic chronic kidney disease: Secondary | ICD-10-CM | POA: Diagnosis not present

## 2020-05-21 DIAGNOSIS — I700 Atherosclerosis of aorta: Secondary | ICD-10-CM | POA: Diagnosis not present

## 2020-05-21 DIAGNOSIS — D689 Coagulation defect, unspecified: Secondary | ICD-10-CM | POA: Diagnosis not present

## 2020-05-21 DIAGNOSIS — K449 Diaphragmatic hernia without obstruction or gangrene: Secondary | ICD-10-CM | POA: Diagnosis not present

## 2020-05-21 DIAGNOSIS — M5134 Other intervertebral disc degeneration, thoracic region: Secondary | ICD-10-CM | POA: Diagnosis not present

## 2020-05-23 DIAGNOSIS — K449 Diaphragmatic hernia without obstruction or gangrene: Secondary | ICD-10-CM | POA: Diagnosis not present

## 2020-05-23 DIAGNOSIS — D849 Immunodeficiency, unspecified: Secondary | ICD-10-CM | POA: Diagnosis not present

## 2020-05-23 DIAGNOSIS — D689 Coagulation defect, unspecified: Secondary | ICD-10-CM | POA: Diagnosis not present

## 2020-05-23 DIAGNOSIS — M5134 Other intervertebral disc degeneration, thoracic region: Secondary | ICD-10-CM | POA: Diagnosis not present

## 2020-05-23 DIAGNOSIS — N1831 Chronic kidney disease, stage 3a: Secondary | ICD-10-CM | POA: Diagnosis not present

## 2020-05-23 DIAGNOSIS — E1122 Type 2 diabetes mellitus with diabetic chronic kidney disease: Secondary | ICD-10-CM | POA: Diagnosis not present

## 2020-05-23 DIAGNOSIS — M329 Systemic lupus erythematosus, unspecified: Secondary | ICD-10-CM | POA: Diagnosis not present

## 2020-05-23 DIAGNOSIS — I4891 Unspecified atrial fibrillation: Secondary | ICD-10-CM | POA: Diagnosis not present

## 2020-05-23 DIAGNOSIS — I129 Hypertensive chronic kidney disease with stage 1 through stage 4 chronic kidney disease, or unspecified chronic kidney disease: Secondary | ICD-10-CM | POA: Diagnosis not present

## 2020-05-23 DIAGNOSIS — I700 Atherosclerosis of aorta: Secondary | ICD-10-CM | POA: Diagnosis not present

## 2020-05-27 ENCOUNTER — Other Ambulatory Visit: Payer: Self-pay | Admitting: *Deleted

## 2020-05-27 NOTE — Patient Outreach (Signed)
[facility]) Care Management  05/27/2020  LYNNIAH JANOSKI [birth year] [id number]   Call placed to member's sister to follow up on recovery from hospitalization and to complete initial assessment, no answer.  HIPAA compliant voice message left, will follow up within the next 3-4 business days.  [name], [place], MSN [place] [phone]

## 2020-05-29 DIAGNOSIS — Z7901 Long term (current) use of anticoagulants: Secondary | ICD-10-CM | POA: Diagnosis not present

## 2020-05-30 ENCOUNTER — Other Ambulatory Visit: Payer: Self-pay | Admitting: *Deleted

## 2020-05-30 NOTE — Patient Outreach (Signed)
[facility]) Care Management  05/30/2020  Rhonda Contreras 1950-11-13 [id number]   Outreach attempt #2, unsuccessful.  Call placed to member's sister to follow up on recovery from hospitalization and to complete initial assessment, no answer.  HIPAA compliant voice message left.  Will send unsuccessful outreach letter and follow up within the next 3-4 business days.  [name], [place], MSN [place] [phone]

## 2020-06-04 ENCOUNTER — Other Ambulatory Visit: Payer: Self-pay | Admitting: *Deleted

## 2020-06-04 NOTE — Patient Outreach (Signed)
[facility]) Care Management  06/04/2020  Rhonda Contreras [birth year] [id number]   Outreach attempt #3, successful to sister however she report this is not a good time to talk.  Will await call back, if no call back will follow up with 4th attempt within the next 3 weeks.  [name], [place], MSN [place] [phone]

## 2020-06-12 DIAGNOSIS — Z7901 Long term (current) use of anticoagulants: Secondary | ICD-10-CM | POA: Diagnosis not present

## 2020-06-27 ENCOUNTER — Other Ambulatory Visit: Payer: Self-pay | Admitting: *Deleted

## 2020-06-27 NOTE — Patient Outreach (Signed)
[facility] [facility]) Care Management  06/27/2020  Rhonda Contreras 08-07-1951 [id number]   Outreach attempt #4, unsuccessful.  No response from member after multiple unsuccessful outreach attempts and letter sent.  Will close case at this time due to inability to maintain contact.  Will notify member and primary MD of case closure.  [name], [place], MSN [place] [phone]

## 2020-07-08 DIAGNOSIS — Z7901 Long term (current) use of anticoagulants: Secondary | ICD-10-CM | POA: Diagnosis not present

## 2020-07-24 DIAGNOSIS — E1122 Type 2 diabetes mellitus with diabetic chronic kidney disease: Secondary | ICD-10-CM | POA: Diagnosis not present

## 2020-07-24 DIAGNOSIS — R760 Raised antibody titer: Secondary | ICD-10-CM | POA: Diagnosis not present

## 2020-07-24 DIAGNOSIS — G8190 Hemiplegia, unspecified affecting unspecified side: Secondary | ICD-10-CM | POA: Diagnosis not present

## 2020-07-24 DIAGNOSIS — Z23 Encounter for immunization: Secondary | ICD-10-CM | POA: Diagnosis not present

## 2020-07-24 DIAGNOSIS — I700 Atherosclerosis of aorta: Secondary | ICD-10-CM | POA: Diagnosis not present

## 2020-07-24 DIAGNOSIS — I10 Essential (primary) hypertension: Secondary | ICD-10-CM | POA: Diagnosis not present

## 2020-07-24 DIAGNOSIS — Z7984 Long term (current) use of oral hypoglycemic drugs: Secondary | ICD-10-CM | POA: Diagnosis not present

## 2020-07-24 DIAGNOSIS — E782 Mixed hyperlipidemia: Secondary | ICD-10-CM | POA: Diagnosis not present

## 2020-08-05 DIAGNOSIS — Z1231 Encounter for screening mammogram for malignant neoplasm of breast: Secondary | ICD-10-CM | POA: Diagnosis not present

## 2020-08-05 DIAGNOSIS — Z7901 Long term (current) use of anticoagulants: Secondary | ICD-10-CM | POA: Diagnosis not present

## 2020-08-28 DIAGNOSIS — R69 Illness, unspecified: Secondary | ICD-10-CM | POA: Diagnosis not present

## 2020-09-08 DIAGNOSIS — Z7901 Long term (current) use of anticoagulants: Secondary | ICD-10-CM | POA: Diagnosis not present

## 2020-09-23 DIAGNOSIS — Z7901 Long term (current) use of anticoagulants: Secondary | ICD-10-CM | POA: Diagnosis not present

## 2020-10-03 DIAGNOSIS — I10 Essential (primary) hypertension: Secondary | ICD-10-CM | POA: Diagnosis not present

## 2020-10-22 DIAGNOSIS — Z7901 Long term (current) use of anticoagulants: Secondary | ICD-10-CM | POA: Diagnosis not present

## 2020-10-22 DIAGNOSIS — H524 Presbyopia: Secondary | ICD-10-CM | POA: Diagnosis not present

## 2020-10-22 DIAGNOSIS — I10 Essential (primary) hypertension: Secondary | ICD-10-CM | POA: Diagnosis not present

## 2020-11-18 DIAGNOSIS — N183 Chronic kidney disease, stage 3 unspecified: Secondary | ICD-10-CM | POA: Diagnosis not present

## 2020-11-18 DIAGNOSIS — I639 Cerebral infarction, unspecified: Secondary | ICD-10-CM | POA: Diagnosis not present

## 2020-11-18 DIAGNOSIS — G8190 Hemiplegia, unspecified affecting unspecified side: Secondary | ICD-10-CM | POA: Diagnosis not present

## 2020-11-18 DIAGNOSIS — Z7984 Long term (current) use of oral hypoglycemic drugs: Secondary | ICD-10-CM | POA: Diagnosis not present

## 2020-11-18 DIAGNOSIS — I700 Atherosclerosis of aorta: Secondary | ICD-10-CM | POA: Diagnosis not present

## 2020-11-18 DIAGNOSIS — E782 Mixed hyperlipidemia: Secondary | ICD-10-CM | POA: Diagnosis not present

## 2020-11-18 DIAGNOSIS — Z7901 Long term (current) use of anticoagulants: Secondary | ICD-10-CM | POA: Diagnosis not present

## 2020-11-18 DIAGNOSIS — I10 Essential (primary) hypertension: Secondary | ICD-10-CM | POA: Diagnosis not present

## 2020-11-18 DIAGNOSIS — R760 Raised antibody titer: Secondary | ICD-10-CM | POA: Diagnosis not present

## 2020-11-18 DIAGNOSIS — E1122 Type 2 diabetes mellitus with diabetic chronic kidney disease: Secondary | ICD-10-CM | POA: Diagnosis not present

## 2020-12-16 DIAGNOSIS — Z7901 Long term (current) use of anticoagulants: Secondary | ICD-10-CM | POA: Diagnosis not present

## 2021-01-12 DIAGNOSIS — L578 Other skin changes due to chronic exposure to nonionizing radiation: Secondary | ICD-10-CM | POA: Diagnosis not present

## 2021-01-12 DIAGNOSIS — Z85828 Personal history of other malignant neoplasm of skin: Secondary | ICD-10-CM | POA: Diagnosis not present

## 2021-01-12 DIAGNOSIS — L821 Other seborrheic keratosis: Secondary | ICD-10-CM | POA: Diagnosis not present

## 2021-01-23 DIAGNOSIS — Z7901 Long term (current) use of anticoagulants: Secondary | ICD-10-CM | POA: Diagnosis not present

## 2021-02-23 DIAGNOSIS — Z7901 Long term (current) use of anticoagulants: Secondary | ICD-10-CM | POA: Diagnosis not present

## 2021-03-12 DIAGNOSIS — L719 Rosacea, unspecified: Secondary | ICD-10-CM | POA: Diagnosis not present

## 2021-03-12 DIAGNOSIS — R760 Raised antibody titer: Secondary | ICD-10-CM | POA: Diagnosis not present

## 2021-03-12 DIAGNOSIS — M810 Age-related osteoporosis without current pathological fracture: Secondary | ICD-10-CM | POA: Diagnosis not present

## 2021-03-12 DIAGNOSIS — E1122 Type 2 diabetes mellitus with diabetic chronic kidney disease: Secondary | ICD-10-CM | POA: Diagnosis not present

## 2021-03-12 DIAGNOSIS — I10 Essential (primary) hypertension: Secondary | ICD-10-CM | POA: Diagnosis not present

## 2021-03-12 DIAGNOSIS — I639 Cerebral infarction, unspecified: Secondary | ICD-10-CM | POA: Diagnosis not present

## 2021-03-12 DIAGNOSIS — I69359 Hemiplegia and hemiparesis following cerebral infarction affecting unspecified side: Secondary | ICD-10-CM | POA: Diagnosis not present

## 2021-03-12 DIAGNOSIS — Z1389 Encounter for screening for other disorder: Secondary | ICD-10-CM | POA: Diagnosis not present

## 2021-03-12 DIAGNOSIS — Z7984 Long term (current) use of oral hypoglycemic drugs: Secondary | ICD-10-CM | POA: Diagnosis not present

## 2021-03-12 DIAGNOSIS — E782 Mixed hyperlipidemia: Secondary | ICD-10-CM | POA: Diagnosis not present

## 2021-03-12 DIAGNOSIS — Z0000 Encounter for general adult medical examination without abnormal findings: Secondary | ICD-10-CM | POA: Diagnosis not present

## 2021-03-12 DIAGNOSIS — I700 Atherosclerosis of aorta: Secondary | ICD-10-CM | POA: Diagnosis not present

## 2021-03-12 DIAGNOSIS — N183 Chronic kidney disease, stage 3 unspecified: Secondary | ICD-10-CM | POA: Diagnosis not present

## 2021-03-12 DIAGNOSIS — Z7901 Long term (current) use of anticoagulants: Secondary | ICD-10-CM | POA: Diagnosis not present

## 2021-03-26 DIAGNOSIS — Z7901 Long term (current) use of anticoagulants: Secondary | ICD-10-CM | POA: Diagnosis not present

## 2021-04-28 DIAGNOSIS — Z7901 Long term (current) use of anticoagulants: Secondary | ICD-10-CM | POA: Diagnosis not present

## 2021-05-26 DIAGNOSIS — Z7901 Long term (current) use of anticoagulants: Secondary | ICD-10-CM | POA: Diagnosis not present

## 2021-07-07 DIAGNOSIS — I639 Cerebral infarction, unspecified: Secondary | ICD-10-CM | POA: Diagnosis not present

## 2021-07-07 DIAGNOSIS — I700 Atherosclerosis of aorta: Secondary | ICD-10-CM | POA: Diagnosis not present

## 2021-07-07 DIAGNOSIS — N183 Chronic kidney disease, stage 3 unspecified: Secondary | ICD-10-CM | POA: Diagnosis not present

## 2021-07-07 DIAGNOSIS — G8190 Hemiplegia, unspecified affecting unspecified side: Secondary | ICD-10-CM | POA: Diagnosis not present

## 2021-07-07 DIAGNOSIS — Z23 Encounter for immunization: Secondary | ICD-10-CM | POA: Diagnosis not present

## 2021-07-07 DIAGNOSIS — L719 Rosacea, unspecified: Secondary | ICD-10-CM | POA: Diagnosis not present

## 2021-07-07 DIAGNOSIS — E782 Mixed hyperlipidemia: Secondary | ICD-10-CM | POA: Diagnosis not present

## 2021-07-07 DIAGNOSIS — E1122 Type 2 diabetes mellitus with diabetic chronic kidney disease: Secondary | ICD-10-CM | POA: Diagnosis not present

## 2021-07-07 DIAGNOSIS — Z7901 Long term (current) use of anticoagulants: Secondary | ICD-10-CM | POA: Diagnosis not present

## 2021-07-07 DIAGNOSIS — I10 Essential (primary) hypertension: Secondary | ICD-10-CM | POA: Diagnosis not present

## 2021-07-07 DIAGNOSIS — R760 Raised antibody titer: Secondary | ICD-10-CM | POA: Diagnosis not present

## 2021-08-05 DIAGNOSIS — Z7901 Long term (current) use of anticoagulants: Secondary | ICD-10-CM | POA: Diagnosis not present

## 2021-09-01 DIAGNOSIS — Z7901 Long term (current) use of anticoagulants: Secondary | ICD-10-CM | POA: Diagnosis not present

## 2021-10-27 DIAGNOSIS — Z7901 Long term (current) use of anticoagulants: Secondary | ICD-10-CM | POA: Diagnosis not present

## 2021-11-24 DIAGNOSIS — Z7901 Long term (current) use of anticoagulants: Secondary | ICD-10-CM | POA: Diagnosis not present

## 2021-12-22 DIAGNOSIS — Z7901 Long term (current) use of anticoagulants: Secondary | ICD-10-CM | POA: Diagnosis not present

## 2022-01-05 DIAGNOSIS — Z7901 Long term (current) use of anticoagulants: Secondary | ICD-10-CM | POA: Diagnosis not present

## 2022-01-19 DIAGNOSIS — H35033 Hypertensive retinopathy, bilateral: Secondary | ICD-10-CM | POA: Diagnosis not present

## 2022-01-19 DIAGNOSIS — H1789 Other corneal scars and opacities: Secondary | ICD-10-CM | POA: Diagnosis not present

## 2022-01-19 DIAGNOSIS — H524 Presbyopia: Secondary | ICD-10-CM | POA: Diagnosis not present

## 2022-01-19 DIAGNOSIS — H5212 Myopia, left eye: Secondary | ICD-10-CM | POA: Diagnosis not present

## 2022-01-19 DIAGNOSIS — H5201 Hypermetropia, right eye: Secondary | ICD-10-CM | POA: Diagnosis not present

## 2022-01-19 DIAGNOSIS — H1840 Unspecified corneal degeneration: Secondary | ICD-10-CM | POA: Diagnosis not present

## 2022-02-02 DIAGNOSIS — Z7901 Long term (current) use of anticoagulants: Secondary | ICD-10-CM | POA: Diagnosis not present

## 2022-03-02 DIAGNOSIS — Z7901 Long term (current) use of anticoagulants: Secondary | ICD-10-CM | POA: Diagnosis not present

## 2022-03-30 DIAGNOSIS — Z7901 Long term (current) use of anticoagulants: Secondary | ICD-10-CM | POA: Diagnosis not present

## 2022-04-15 DIAGNOSIS — L719 Rosacea, unspecified: Secondary | ICD-10-CM | POA: Diagnosis not present

## 2022-04-15 DIAGNOSIS — I69359 Hemiplegia and hemiparesis following cerebral infarction affecting unspecified side: Secondary | ICD-10-CM | POA: Diagnosis not present

## 2022-04-15 DIAGNOSIS — N1831 Chronic kidney disease, stage 3a: Secondary | ICD-10-CM | POA: Diagnosis not present

## 2022-04-15 DIAGNOSIS — E1122 Type 2 diabetes mellitus with diabetic chronic kidney disease: Secondary | ICD-10-CM | POA: Diagnosis not present

## 2022-04-15 DIAGNOSIS — G8190 Hemiplegia, unspecified affecting unspecified side: Secondary | ICD-10-CM | POA: Diagnosis not present

## 2022-04-15 DIAGNOSIS — I10 Essential (primary) hypertension: Secondary | ICD-10-CM | POA: Diagnosis not present

## 2022-04-15 DIAGNOSIS — Z1239 Encounter for other screening for malignant neoplasm of breast: Secondary | ICD-10-CM | POA: Diagnosis not present

## 2022-04-15 DIAGNOSIS — M810 Age-related osteoporosis without current pathological fracture: Secondary | ICD-10-CM | POA: Diagnosis not present

## 2022-04-15 DIAGNOSIS — Z7901 Long term (current) use of anticoagulants: Secondary | ICD-10-CM | POA: Diagnosis not present

## 2022-04-15 DIAGNOSIS — I700 Atherosclerosis of aorta: Secondary | ICD-10-CM | POA: Diagnosis not present

## 2022-04-15 DIAGNOSIS — E782 Mixed hyperlipidemia: Secondary | ICD-10-CM | POA: Diagnosis not present

## 2022-04-28 DIAGNOSIS — R946 Abnormal results of thyroid function studies: Secondary | ICD-10-CM | POA: Diagnosis not present

## 2022-04-28 DIAGNOSIS — Z7901 Long term (current) use of anticoagulants: Secondary | ICD-10-CM | POA: Diagnosis not present

## 2022-05-25 DIAGNOSIS — Z7901 Long term (current) use of anticoagulants: Secondary | ICD-10-CM | POA: Diagnosis not present

## 2022-06-22 DIAGNOSIS — S61451A Open bite of right hand, initial encounter: Secondary | ICD-10-CM | POA: Diagnosis not present

## 2022-06-22 DIAGNOSIS — S61459A Open bite of unspecified hand, initial encounter: Secondary | ICD-10-CM | POA: Diagnosis not present

## 2022-06-22 DIAGNOSIS — Z23 Encounter for immunization: Secondary | ICD-10-CM | POA: Diagnosis not present

## 2022-06-22 DIAGNOSIS — L0390 Cellulitis, unspecified: Secondary | ICD-10-CM | POA: Diagnosis not present

## 2022-06-22 DIAGNOSIS — Z7901 Long term (current) use of anticoagulants: Secondary | ICD-10-CM | POA: Diagnosis not present

## 2022-06-24 DIAGNOSIS — Z7901 Long term (current) use of anticoagulants: Secondary | ICD-10-CM | POA: Diagnosis not present

## 2022-07-20 DIAGNOSIS — M8588 Other specified disorders of bone density and structure, other site: Secondary | ICD-10-CM | POA: Diagnosis not present

## 2022-07-20 DIAGNOSIS — Z780 Asymptomatic menopausal state: Secondary | ICD-10-CM | POA: Diagnosis not present

## 2022-07-20 DIAGNOSIS — M810 Age-related osteoporosis without current pathological fracture: Secondary | ICD-10-CM | POA: Diagnosis not present

## 2022-07-20 DIAGNOSIS — Z7901 Long term (current) use of anticoagulants: Secondary | ICD-10-CM | POA: Diagnosis not present

## 2022-07-20 DIAGNOSIS — Z1231 Encounter for screening mammogram for malignant neoplasm of breast: Secondary | ICD-10-CM | POA: Diagnosis not present

## 2022-08-24 DIAGNOSIS — Z7901 Long term (current) use of anticoagulants: Secondary | ICD-10-CM | POA: Diagnosis not present

## 2022-09-23 DIAGNOSIS — Z7901 Long term (current) use of anticoagulants: Secondary | ICD-10-CM | POA: Diagnosis not present

## 2022-10-07 DIAGNOSIS — Z7901 Long term (current) use of anticoagulants: Secondary | ICD-10-CM | POA: Diagnosis not present

## 2022-11-01 DIAGNOSIS — E782 Mixed hyperlipidemia: Secondary | ICD-10-CM | POA: Diagnosis not present

## 2022-11-01 DIAGNOSIS — I69359 Hemiplegia and hemiparesis following cerebral infarction affecting unspecified side: Secondary | ICD-10-CM | POA: Diagnosis not present

## 2022-11-01 DIAGNOSIS — I639 Cerebral infarction, unspecified: Secondary | ICD-10-CM | POA: Diagnosis not present

## 2022-11-01 DIAGNOSIS — R760 Raised antibody titer: Secondary | ICD-10-CM | POA: Diagnosis not present

## 2022-11-01 DIAGNOSIS — I10 Essential (primary) hypertension: Secondary | ICD-10-CM | POA: Diagnosis not present

## 2022-11-01 DIAGNOSIS — L719 Rosacea, unspecified: Secondary | ICD-10-CM | POA: Diagnosis not present

## 2022-11-01 DIAGNOSIS — G8190 Hemiplegia, unspecified affecting unspecified side: Secondary | ICD-10-CM | POA: Diagnosis not present

## 2022-11-01 DIAGNOSIS — N1831 Chronic kidney disease, stage 3a: Secondary | ICD-10-CM | POA: Diagnosis not present

## 2022-11-01 DIAGNOSIS — M810 Age-related osteoporosis without current pathological fracture: Secondary | ICD-10-CM | POA: Diagnosis not present

## 2022-11-01 DIAGNOSIS — Z7901 Long term (current) use of anticoagulants: Secondary | ICD-10-CM | POA: Diagnosis not present

## 2022-11-01 DIAGNOSIS — I700 Atherosclerosis of aorta: Secondary | ICD-10-CM | POA: Diagnosis not present

## 2022-11-01 DIAGNOSIS — E1122 Type 2 diabetes mellitus with diabetic chronic kidney disease: Secondary | ICD-10-CM | POA: Diagnosis not present

## 2022-12-02 DIAGNOSIS — I10 Essential (primary) hypertension: Secondary | ICD-10-CM | POA: Diagnosis not present

## 2022-12-02 DIAGNOSIS — Z7901 Long term (current) use of anticoagulants: Secondary | ICD-10-CM | POA: Diagnosis not present

## 2022-12-30 DIAGNOSIS — Z7901 Long term (current) use of anticoagulants: Secondary | ICD-10-CM | POA: Diagnosis not present

## 2023-01-27 DIAGNOSIS — Z7901 Long term (current) use of anticoagulants: Secondary | ICD-10-CM | POA: Diagnosis not present

## 2023-02-24 DIAGNOSIS — Z7901 Long term (current) use of anticoagulants: Secondary | ICD-10-CM | POA: Diagnosis not present

## 2023-03-10 ENCOUNTER — Other Ambulatory Visit: Payer: Self-pay | Admitting: Internal Medicine

## 2023-03-10 ENCOUNTER — Ambulatory Visit
Admission: RE | Admit: 2023-03-10 | Discharge: 2023-03-10 | Disposition: A | Discharge status: Home / Self Care | Payer: Medicare HMO | Source: Ambulatory Visit | Attending: Internal Medicine | Admitting: Internal Medicine

## 2023-03-10 DIAGNOSIS — S5001XA Contusion of right elbow, initial encounter: Secondary | ICD-10-CM

## 2023-03-10 DIAGNOSIS — M25421 Effusion, right elbow: Secondary | ICD-10-CM | POA: Diagnosis not present

## 2023-03-10 DIAGNOSIS — S5000XA Contusion of unspecified elbow, initial encounter: Secondary | ICD-10-CM | POA: Diagnosis not present

## 2023-03-10 DIAGNOSIS — S42309A Unspecified fracture of shaft of humerus, unspecified arm, initial encounter for closed fracture: Secondary | ICD-10-CM | POA: Diagnosis not present

## 2023-03-10 DIAGNOSIS — S40021A Contusion of right upper arm, initial encounter: Secondary | ICD-10-CM | POA: Diagnosis not present

## 2023-03-10 DIAGNOSIS — S52021A Displaced fracture of olecranon process without intraarticular extension of right ulna, initial encounter for closed fracture: Secondary | ICD-10-CM | POA: Diagnosis not present

## 2023-03-10 DIAGNOSIS — R791 Abnormal coagulation profile: Secondary | ICD-10-CM | POA: Diagnosis not present

## 2023-03-10 DIAGNOSIS — W19XXXA Unspecified fall, initial encounter: Secondary | ICD-10-CM | POA: Diagnosis not present

## 2023-03-10 DIAGNOSIS — S42201A Unspecified fracture of upper end of right humerus, initial encounter for closed fracture: Secondary | ICD-10-CM | POA: Diagnosis not present

## 2023-03-10 DIAGNOSIS — Z7901 Long term (current) use of anticoagulants: Secondary | ICD-10-CM | POA: Diagnosis not present

## 2023-03-11 NOTE — Progress Notes (Signed)
Patient interviewed over the phone. Labs to be done DOS. Went over instructions verbally with patient and patients sister Kermit Balo d/t patient having a poor memory post stroke. Also emailed instructions to [place] at [email or web address] per preference. All questions were answered.  COVID Vaccine received:   No []  Yes Date of any COVID positive[x]  Test in last 90 days: No  PCP - Georgann Housekeeper MD Cardiologist - No  Chest x-ray -05/04/20 EPIC  EKG -  05/05/20 EPIC Stress Test - No ECHO - 05/22/2008 EPIC Cardiac Cath - No  PCR screen: []  Ordered & Completed           []   No Order but Needs PROFEND           [x]   N/A for this surgery  Surgery Plan:  []  Ambulatory                            [x]  Outpatient in bed                            []  Admit  Anesthesia:    []  General  []  Spinal                           []   Choice []   MAC  Bowel Prep - [x]  No  []   Yes ______  Pacemaker / ICD device [x]  No []  Yes   Spinal Cord Stimulator:[x]  No []  Yes       History of Sleep Apnea? [x]  No []  Yes   CPAP used?- [x]  No []  Yes    Does the patient monitor blood sugar?          [x]  No []  Yes  []  N/A  Patient has: []  NO Hx DM   []  Pre-DM                 []  DM1  [x]   DM2 Does patient have a [employer] or Dexacom? [x]  No []  Yes   Fasting Blood Sugar Ranges- not checking at home Checks Blood Sugar times a day  GLP1 agonist / usual dose - No GLP1 instructions:  SGLT-2 inhibitors / usual dose - No SGLT-2 instructions:   Other Diabetic medications/ instructions: Take metformin day before as prescribed. Take morning dose of Glimperide, no evening dose.  Blood Thinner / Instructions:Warfarin, instructed to hold 6 days. Last dose 03/09/23 at 1700 Aspirin Instructions: ASA 81, per patient not instructed to stop.  ERAS Protocol Ordered: []  No  []  Yes PRE-SURGERY []  ENSURE  []  G2  []  No Drink Ordered  Patient is to be NPO after:   Comments: Patient disoriented to time. Aware of surgery tomorrow,  oriented to place, self, and situation. Has memory issues post stroke  Activity level: Patient is able  to climb a flight of stairs without difficulty; [x]  No CP  [x]  No SOB,    Patient can  perform ADLs without assistance.   Anesthesia review: DM2, chronic anticoagulation, CVA, HTN, CKD3  Patient denies shortness of breath, fever, cough and chest pain at PAT appointment.  Patient verbalized understanding and agreement to the Pre-Surgical Instructions that were given to them at this PAT appointment. Patient was also educated of the need to review these PAT instructions again prior to his/her surgery.I reviewed the appropriate phone numbers to call if they have any and questions or concerns.

## 2023-03-11 NOTE — Progress Notes (Signed)
Please send orders for patient's PST appointment 03/16/23.

## 2023-03-11 NOTE — Patient Instructions (Addendum)
SURGICAL WAITING ROOM VISITATION  Patients having surgery or a procedure may have no more than 2 support people in the waiting area - these visitors may rotate.    Children under the age of 68 must have an adult with them who is not the patient.  Due to an increase in RSV and influenza rates and associated hospitalizations, children ages 55 and under may not visit patients in [facility] hospitals.  If the patient needs to stay at the hospital during part of their recovery, the visitor guidelines for inpatient rooms apply. Pre-op nurse will coordinate an appropriate time for 1 support person to accompany patient in pre-op.  This support person may not rotate.    Please refer to the [facility] website for the visitor guidelines for Inpatients (after your surgery is over and you are in a regular room).     Your procedure is scheduled on: Mar 16, 2023   Report to [facility] Main Entrance    Report to admitting at 9:30 AM   Call this number if you have problems the morning of surgery [phone]   Do not eat food :After Midnight.   After Midnight you may have the following liquids until 8:45 AM DAY OF SURGERY  Water Non-Citrus Juices (without pulp, NO RED-Apple, White grape, White cranberry) Black Coffee (NO MILK/CREAM OR CREAMERS, sugar ok)  Clear Tea (NO MILK/CREAM OR CREAMERS, sugar ok) regular and decaf                             Plain Jell-O (NO RED)                                           Fruit ices (not with fruit pulp, NO RED)                                     Popsicles (NO RED)                                                               Sports drinks like Gatorade (NO RED)                     If you have questions, please contact your surgeon's office.   FOLLOW BOWEL PREP AND ANY ADDITIONAL PRE OP INSTRUCTIONS YOU RECEIVED FROM YOUR SURGEON'S OFFICE!!!     Oral Hygiene is also important to reduce your risk of infection.                                     Remember - BRUSH YOUR TEETH THE MORNING OF SURGERY WITH YOUR REGULAR TOOTHPASTE  DENTURES WILL BE REMOVED PRIOR TO SURGERY PLEASE DO NOT APPLY "Poly grip" OR ADHESIVES!!!     Take these medicines the morning of surgery with A SIP OF WATER: Tylenol, Amlodipine, Atorvastatin,Claritin, Metoprolol   DO NOT TAKE ANY ORAL DIABETIC MEDICATIONS DAY OF YOUR SURGERY  You may not have any metal on your body including hair pins, jewelry, and body piercing             Do not wear make-up, lotions, powders, perfumes/cologne, or deodorant  Do not wear nail polish including gel and S&S, artificial/acrylic nails, or any other type of covering on natural nails including finger and toenails. If you have artificial nails, gel coating, etc. that needs to be removed by a nail salon please have this removed prior to surgery or surgery may need to be canceled/ delayed if the surgeon/ anesthesia feels like they are unable to be safely monitored.   Do not shave  48 hours prior to surgery.    Do not bring valuables to the hospital.  IS NOT             RESPONSIBLE   FOR VALUABLES.   Contacts, glasses, dentures or bridgework may not be worn into surgery.   Bring small overnight bag day of surgery.   DO NOT BRING YOUR HOME MEDICATIONS TO THE HOSPITAL. PHARMACY WILL DISPENSE MEDICATIONS LISTED ON YOUR MEDICATION LIST TO YOU DURING YOUR ADMISSION IN THE HOSPITAL!   Special Instructions: Bring a copy of your healthcare power of attorney and living will documents the day of surgery if you haven't scanned them before.              Please read over the following fact sheets you were given: IF YOU HAVE QUESTIONS ABOUT YOUR PRE-OP INSTRUCTIONS PLEASE CALL [phone] Contras   If you received a COVID test during your pre-op visit  it is requested that you wear a mask when out in public, stay away from anyone that may not be feeling well and notify your surgeon if you develop  symptoms. If you test positive for Covid or have been in contact with anyone that has tested positive in the last 10 days please notify you surgeon.    These are anesthesia recommendations for holding your anticoagulants.  Please contact your prescribing physician to confirm IF it is safe to hold your anticoagulants for this length of time.   Eliquis Apixaban   72 hours   Xarelto Rivaroxaban   72 hours  Plavix Clopidogrel   120 hours  Pletal Cilostazol   120 hours  How to Manage Your Diabetes Before and After Surgery  Why is it important to control my blood sugar before and after surgery? Improving blood sugar levels before and after surgery helps healing and can limit problems. A way of improving blood sugar control is eating a healthy diet by:  Eating less sugar and carbohydrates  Increasing activity/exercise  Talking with your doctor about reaching your blood sugar goals High blood sugars (greater than 180 mg/dL) can raise your risk of infections and slow your recovery, so you will need to focus on controlling your diabetes during the weeks before surgery. Make sure that the doctor who takes care of your diabetes knows about your planned surgery including the date and location.  How do I manage my blood sugar before surgery? Check your blood sugar at least 4 times a day, starting 2 days before surgery, to make sure that the level is not too high or low. Check your blood sugar the morning of your surgery when you wake up and every 2 hours until you get to the Short Stay unit. If your blood sugar is less than 70 mg/dL, you will need to treat for low blood sugar: Do not take  insulin. Treat a low blood sugar (less than 70 mg/dL) with  cup of clear juice (cranberry or apple), 4 glucose tablets, OR glucose gel. Recheck blood sugar in 15 minutes after treatment (to make sure it is greater than 70 mg/dL). If your blood sugar is not greater than 70 mg/dL on recheck, call [phone] for  further instructions. Report your blood sugar to the short stay nurse when you get to Short Stay.  If you are admitted to the hospital after surgery: Your blood sugar will be checked by the staff and you will probably be given insulin after surgery (instead of oral diabetes medicines) to make sure you have good blood sugar levels. The goal for blood sugar control after surgery is 80-180 mg/dL.   WHAT DO I DO ABOUT MY DIABETES MEDICATION?  Do not take oral diabetes medicines (pills) the morning of surgery.       May take Glimepiride dose in the morning the day before surgery. Do not take an evening dose. Take Metformin as prescribed.        Do not take Glimepiride or Metformin the day of surgery.  If your CBG is greater than 220 mg/dL, you may take  of your sliding scale  (correction) dose of insulin.     Reviewed and Endorsed by [facility] Patient Education Committee, August 2015  [facility] - Preparing for Surgery Before surgery, you can play an important role.  Because skin is not sterile, your skin needs to be as free of germs as possible.  You can reduce the number of germs on your skin by washing with CHG (chlorahexidine gluconate) soap before surgery.  CHG is an antiseptic cleaner which kills germs and bonds with the skin to continue killing germs even after washing. Please DO NOT use if you have an allergy to CHG or antibacterial soaps.  If your skin becomes reddened/irritated stop using the CHG and inform your nurse when you arrive at Short Stay. Do not shave (including legs and underarms) for at least 48 hours prior to the first CHG shower.  You may shave your face/neck.  Please follow these instructions carefully:  1.  Shower with CHG Soap the night before surgery and the  morning of surgery.  2.  If you choose to wash your hair, wash your hair first as usual with your normal  shampoo.  3.  After you shampoo, rinse your hair and body thoroughly to remove the shampoo.                              4.  Use CHG as you would any other liquid soap.  You can apply chg directly to the skin and wash.  Gently with a scrungie or clean washcloth.  5.  Apply the CHG Soap to your body ONLY FROM THE NECK DOWN.   Do   not use on face/ open                           Wound or open sores. Avoid contact with eyes, ears mouth and   genitals (private parts).                       Wash face,  Genitals (private parts) with your normal soap.             6.  Wash thoroughly, paying special attention to  the area where your    surgery  will be performed.  7.  Thoroughly rinse your body with warm water from the neck down.  8.  DO NOT shower/wash with your normal soap after using and rinsing off the CHG Soap.                9.  Pat yourself dry with a clean towel.            10.  Wear clean pajamas.            11.  Place clean sheets on your bed the night of your first shower and do not  sleep with pets. Day of Surgery : Do not apply any lotions/deodorants the morning of surgery.  Please wear clean clothes to the hospital/surgery center.  FAILURE TO FOLLOW THESE INSTRUCTIONS MAY RESULT IN THE CANCELLATION OF YOUR SURGERY  PATIENT SIGNATURE_________________________________  NURSE SIGNATURE__________________________________  ________________________________________________________________________

## 2023-03-14 NOTE — Progress Notes (Signed)
COVID Vaccine received:  []  No []  Yes Date of any COVID positive Test in last 90 days:   PCP - Georgann Housekeeper MD Cardiologist -    Chest x-ray -05/04/20 EPIC  EKG -  05/05/20 EPIC Stress Test -  ECHO - 05/22/2008 EPIC Cardiac Cath -    PCR screen: []  Ordered & Completed           []   No Order but Needs PROFEND           []   N/A for this surgery   Surgery Plan:  []  Ambulatory                            []  Outpatient in bed                            []  Admit   Anesthesia:    []  General  []  Spinal                           []   Choice []   MAC   Bowel Prep - []  No  []   Yes ______   Pacemaker / ICD device []  No []  Yes   Spinal Cord Stimulator:[]  No []  Yes       History of Sleep Apnea? []  No []  Yes   CPAP used?- []  No []  Yes     Does the patient monitor blood sugar?          []  No []  Yes  []  N/A   Patient has: []  NO Hx DM   []  Pre-DM                 []  DM1  []   DM2 Does patient have a [employer] or Dexacom? []  No []  Yes   Fasting Blood Sugar Ranges-  Checks Blood Sugar _____ times a day   GLP1 agonist / usual dose -  GLP1 instructions:  SGLT-2 inhibitors / usual dose -  SGLT-2 instructions:    Other Diabetic medications/ instructions:    Blood Thinner / Instructions: Aspirin Instructions:   ERAS Protocol Ordered: []  No  []  Yes PRE-SURGERY []  ENSURE  []  G2  []  No Drink Ordered   Patient is to be NPO after:    Comments:    Activity level: Patient is able / unable to climb a flight of stairs without difficulty; []  No CP  []  No SOB, but would have ___   Patient can / can not perform ADLs without assistance.    Anesthesia review:    Patient denies shortness of breath, fever, cough and chest pain at PAT appointment.   Patient verbalized understanding and agreement to the Pre-Surgical Instructions that were given to them at this PAT appointment. Patient was also educated of the need to review these PAT instructions again prior to his/her surgery.I reviewed the  appropriate phone numbers to call if they have any and questions or concern

## 2023-03-15 ENCOUNTER — Encounter (HOSPITAL_COMMUNITY): Payer: Self-pay

## 2023-03-15 ENCOUNTER — Encounter (HOSPITAL_COMMUNITY)
Admission: RE | Admit: 2023-03-15 | Discharge: 2023-03-15 | Disposition: A | Discharge status: Home / Self Care | Payer: Medicare HMO | Source: Ambulatory Visit | Attending: Orthopedic Surgery | Admitting: Orthopedic Surgery

## 2023-03-15 ENCOUNTER — Other Ambulatory Visit: Payer: Self-pay

## 2023-03-15 ENCOUNTER — Ambulatory Visit (HOSPITAL_COMMUNITY): Payer: Self-pay | Admitting: Emergency Medicine

## 2023-03-15 VITALS — Ht <= 58 in | Wt 135.0 lb

## 2023-03-15 DIAGNOSIS — I10 Essential (primary) hypertension: Secondary | ICD-10-CM

## 2023-03-15 DIAGNOSIS — S52021A Displaced fracture of olecranon process without intraarticular extension of right ulna, initial encounter for closed fracture: Secondary | ICD-10-CM

## 2023-03-15 DIAGNOSIS — E119 Type 2 diabetes mellitus without complications: Secondary | ICD-10-CM

## 2023-03-15 DIAGNOSIS — Z01818 Encounter for other preprocedural examination: Secondary | ICD-10-CM

## 2023-03-15 DIAGNOSIS — D689 Coagulation defect, unspecified: Secondary | ICD-10-CM

## 2023-03-15 HISTORY — DX: Unspecified osteoarthritis, unspecified site: M19.90

## 2023-03-16 ENCOUNTER — Ambulatory Visit (HOSPITAL_COMMUNITY): Payer: Medicare HMO | Admitting: Physician Assistant

## 2023-03-16 ENCOUNTER — Ambulatory Visit (HOSPITAL_COMMUNITY): Payer: Medicare HMO

## 2023-03-16 ENCOUNTER — Ambulatory Visit (HOSPITAL_BASED_OUTPATIENT_CLINIC_OR_DEPARTMENT_OTHER): Payer: Medicare HMO | Admitting: Anesthesiology

## 2023-03-16 ENCOUNTER — Other Ambulatory Visit: Payer: Self-pay

## 2023-03-16 ENCOUNTER — Ambulatory Visit (HOSPITAL_COMMUNITY)
Admission: RE | Admit: 2023-03-16 | Discharge: 2023-03-16 | Disposition: A | Discharge status: Home / Self Care | Payer: Medicare HMO | Attending: Orthopedic Surgery | Admitting: Orthopedic Surgery

## 2023-03-16 ENCOUNTER — Encounter (HOSPITAL_COMMUNITY): Payer: Self-pay | Admitting: Orthopedic Surgery

## 2023-03-16 ENCOUNTER — Encounter (HOSPITAL_COMMUNITY)
Admission: RE | Disposition: A | Discharge status: Home / Self Care | Payer: Self-pay | Source: Home / Self Care | Attending: Orthopedic Surgery

## 2023-03-16 DIAGNOSIS — I69351 Hemiplegia and hemiparesis following cerebral infarction affecting right dominant side: Secondary | ICD-10-CM | POA: Diagnosis not present

## 2023-03-16 DIAGNOSIS — W19XXXA Unspecified fall, initial encounter: Secondary | ICD-10-CM | POA: Diagnosis not present

## 2023-03-16 DIAGNOSIS — I129 Hypertensive chronic kidney disease with stage 1 through stage 4 chronic kidney disease, or unspecified chronic kidney disease: Secondary | ICD-10-CM | POA: Diagnosis not present

## 2023-03-16 DIAGNOSIS — S52021A Displaced fracture of olecranon process without intraarticular extension of right ulna, initial encounter for closed fracture: Secondary | ICD-10-CM | POA: Diagnosis not present

## 2023-03-16 DIAGNOSIS — Z7984 Long term (current) use of oral hypoglycemic drugs: Secondary | ICD-10-CM

## 2023-03-16 DIAGNOSIS — Z7901 Long term (current) use of anticoagulants: Secondary | ICD-10-CM | POA: Diagnosis not present

## 2023-03-16 DIAGNOSIS — E1122 Type 2 diabetes mellitus with diabetic chronic kidney disease: Secondary | ICD-10-CM | POA: Insufficient documentation

## 2023-03-16 DIAGNOSIS — M25521 Pain in right elbow: Secondary | ICD-10-CM | POA: Diagnosis not present

## 2023-03-16 DIAGNOSIS — I10 Essential (primary) hypertension: Secondary | ICD-10-CM

## 2023-03-16 DIAGNOSIS — G8918 Other acute postprocedural pain: Secondary | ICD-10-CM | POA: Diagnosis not present

## 2023-03-16 DIAGNOSIS — N182 Chronic kidney disease, stage 2 (mild): Secondary | ICD-10-CM | POA: Insufficient documentation

## 2023-03-16 DIAGNOSIS — M329 Systemic lupus erythematosus, unspecified: Secondary | ICD-10-CM | POA: Insufficient documentation

## 2023-03-16 DIAGNOSIS — D689 Coagulation defect, unspecified: Secondary | ICD-10-CM

## 2023-03-16 DIAGNOSIS — S52031A Displaced fracture of olecranon process with intraarticular extension of right ulna, initial encounter for closed fracture: Secondary | ICD-10-CM | POA: Diagnosis not present

## 2023-03-16 DIAGNOSIS — N189 Chronic kidney disease, unspecified: Secondary | ICD-10-CM | POA: Diagnosis not present

## 2023-03-16 DIAGNOSIS — Z01818 Encounter for other preprocedural examination: Secondary | ICD-10-CM

## 2023-03-16 DIAGNOSIS — E119 Type 2 diabetes mellitus without complications: Secondary | ICD-10-CM

## 2023-03-16 HISTORY — PX: ORIF ELBOW FRACTURE: SHX5031

## 2023-03-16 LAB — BASIC METABOLIC PANEL
Anion gap: 12 (ref 5–15)
BUN: 24 mg/dL — ABNORMAL HIGH (ref 8–23)
CO2: 22 mmol/L (ref 22–32)
Calcium: 9.1 mg/dL (ref 8.9–10.3)
Chloride: 103 mmol/L (ref 98–111)
Creatinine, Ser: 1.2 mg/dL — ABNORMAL HIGH (ref 0.44–1.00)
GFR, Estimated: 48 mL/min — ABNORMAL LOW (ref >60)
Glucose, Bld: 154 mg/dL — ABNORMAL HIGH (ref 70–99)
Potassium: 3.5 mmol/L (ref 3.5–5.1)
Sodium: 137 mmol/L (ref 135–145)

## 2023-03-16 LAB — PROTIME-INR
INR: 1.2 (ref 0.8–1.2)
Prothrombin Time: 15.1 seconds (ref 11.4–15.2)

## 2023-03-16 LAB — GLUCOSE, CAPILLARY
Glucose-Capillary: 171 mg/dL — ABNORMAL HIGH (ref 70–99)
Glucose-Capillary: 119 mg/dL — ABNORMAL HIGH (ref 70–99)

## 2023-03-16 LAB — CBC
HCT: 38.2 % (ref 36.0–46.0)
Hemoglobin: 12.1 g/dL (ref 12.0–15.0)
MCH: 29.4 pg (ref 26.0–34.0)
MCHC: 31.7 g/dL (ref 30.0–36.0)
MCV: 92.9 fL (ref 80.0–100.0)
Platelets: 207 10*3/uL (ref 150–400)
RBC: 4.11 MIL/uL (ref 3.87–5.11)
RDW: 13.7 % (ref 11.5–15.5)
WBC: 8.3 10*3/uL (ref 4.0–10.5)
nRBC: 0 % (ref 0.0–0.2)

## 2023-03-16 SURGERY — OPEN REDUCTION INTERNAL FIXATION (ORIF) ELBOW/OLECRANON FRACTURE
Anesthesia: General | Site: Elbow | Laterality: Right

## 2023-03-16 MED ORDER — ALBUTEROL SULFATE (2.5 MG/3ML) 0.083% IN NEBU
2.5000 mg | INHALATION_SOLUTION | Freq: Once | RESPIRATORY_TRACT | Status: AC
  Administered 2023-03-16: 2.5 mg via RESPIRATORY_TRACT

## 2023-03-16 MED ORDER — EPHEDRINE SULFATE (PRESSORS) 50 MG/ML IJ SOLN
INTRAMUSCULAR | Status: DC | PRN
  Administered 2023-03-16 (×3): 5 mg via INTRAVENOUS
  Administered 2023-03-16: 10 mg via INTRAVENOUS

## 2023-03-16 MED ORDER — ONDANSETRON HCL 4 MG/2ML IJ SOLN
INTRAMUSCULAR | Status: AC
  Filled 2023-03-16: qty 2

## 2023-03-16 MED ORDER — BUPIVACAINE-EPINEPHRINE (PF) 0.5% -1:200000 IJ SOLN
INTRAMUSCULAR | Status: DC | PRN
  Administered 2023-03-16: 25 mL via PERINEURAL

## 2023-03-16 MED ORDER — FENTANYL CITRATE PF 50 MCG/ML IJ SOSY: 25.0000 ug | PREFILLED_SYRINGE | INTRAMUSCULAR | Status: DC | PRN

## 2023-03-16 MED ORDER — PHENYLEPHRINE 80 MCG/ML (10ML) SYRINGE FOR IV PUSH (FOR BLOOD PRESSURE SUPPORT)
PREFILLED_SYRINGE | INTRAVENOUS | Status: AC
  Filled 2023-03-16: qty 10

## 2023-03-16 MED ORDER — CHLORHEXIDINE GLUCONATE 0.12 % MT SOLN
15.0000 mL | Freq: Once | OROMUCOSAL | Status: AC
  Administered 2023-03-16: 15 mL via OROMUCOSAL

## 2023-03-16 MED ORDER — ROCURONIUM BROMIDE 10 MG/ML (PF) SYRINGE
PREFILLED_SYRINGE | INTRAVENOUS | Status: AC
  Filled 2023-03-16: qty 10

## 2023-03-16 MED ORDER — FENTANYL CITRATE PF 50 MCG/ML IJ SOSY
50.0000 ug | PREFILLED_SYRINGE | Freq: Once | INTRAMUSCULAR | Status: AC
  Administered 2023-03-16: 50 ug via INTRAVENOUS
  Filled 2023-03-16: qty 1

## 2023-03-16 MED ORDER — DEXAMETHASONE SODIUM PHOSPHATE 10 MG/ML IJ SOLN
INTRAMUSCULAR | Status: DC | PRN
  Administered 2023-03-16: 6 mg via INTRAVENOUS

## 2023-03-16 MED ORDER — ACETAMINOPHEN 500 MG PO TABS: 500.0000 mg | ORAL_TABLET | Freq: Three times a day (TID) | ORAL | 0 refills | Status: AC | PRN

## 2023-03-16 MED ORDER — OXYCODONE HCL 5 MG PO TABS: 5.0000 mg | ORAL_TABLET | Freq: Once | ORAL | Status: DC | PRN

## 2023-03-16 MED ORDER — PROPOFOL 10 MG/ML IV BOLUS
INTRAVENOUS | Status: AC
  Filled 2023-03-16: qty 20

## 2023-03-16 MED ORDER — ONDANSETRON HCL 4 MG PO TABS: 4.0000 mg | ORAL_TABLET | Freq: Three times a day (TID) | ORAL | 0 refills | Status: AC | PRN

## 2023-03-16 MED ORDER — ORAL CARE MOUTH RINSE: 15.0000 mL | Freq: Once | OROMUCOSAL | Status: AC

## 2023-03-16 MED ORDER — ACETAMINOPHEN 500 MG PO TABS: 1000.0000 mg | ORAL_TABLET | Freq: Once | ORAL | Status: DC

## 2023-03-16 MED ORDER — ONDANSETRON HCL 4 MG/2ML IJ SOLN: 4.0000 mg | Freq: Once | INTRAMUSCULAR | Status: DC | PRN

## 2023-03-16 MED ORDER — MIDAZOLAM HCL 2 MG/2ML IJ SOLN
1.0000 mg | Freq: Once | INTRAMUSCULAR | Status: DC
  Filled 2023-03-16: qty 2

## 2023-03-16 MED ORDER — FENTANYL CITRATE (PF) 100 MCG/2ML IJ SOLN
INTRAMUSCULAR | Status: AC
  Filled 2023-03-16: qty 2

## 2023-03-16 MED ORDER — FENTANYL CITRATE (PF) 100 MCG/2ML IJ SOLN
INTRAMUSCULAR | Status: DC | PRN
  Administered 2023-03-16: 100 ug via INTRAVENOUS

## 2023-03-16 MED ORDER — VANCOMYCIN HCL 1000 MG IV SOLR
INTRAVENOUS | Status: DC | PRN
  Administered 2023-03-16: 1000 mg

## 2023-03-16 MED ORDER — LACTATED RINGERS IV SOLN: INTRAVENOUS | Status: DC

## 2023-03-16 MED ORDER — DEXAMETHASONE SODIUM PHOSPHATE 10 MG/ML IJ SOLN
INTRAMUSCULAR | Status: AC
  Filled 2023-03-16: qty 1

## 2023-03-16 MED ORDER — METHOCARBAMOL 500 MG PO TABS: 500.0000 mg | ORAL_TABLET | Freq: Three times a day (TID) | ORAL | 0 refills | Status: AC | PRN

## 2023-03-16 MED ORDER — HYDROCODONE-ACETAMINOPHEN 5-325 MG PO TABS: 1.0000 | ORAL_TABLET | ORAL | 0 refills | Status: AC | PRN

## 2023-03-16 MED ORDER — PHENYLEPHRINE HCL (PRESSORS) 10 MG/ML IV SOLN
INTRAVENOUS | Status: DC | PRN
  Administered 2023-03-16 (×5): 160 ug via INTRAVENOUS

## 2023-03-16 MED ORDER — LIDOCAINE HCL (PF) 2 % IJ SOLN
INTRAMUSCULAR | Status: AC
  Filled 2023-03-16: qty 5

## 2023-03-16 MED ORDER — OXYCODONE HCL 5 MG/5ML PO SOLN: 5.0000 mg | Freq: Once | ORAL | Status: DC | PRN

## 2023-03-16 MED ORDER — ROCURONIUM BROMIDE 100 MG/10ML IV SOLN
INTRAVENOUS | Status: DC | PRN
  Administered 2023-03-16: 50 mg via INTRAVENOUS

## 2023-03-16 MED ORDER — LIDOCAINE HCL (CARDIAC) PF 100 MG/5ML IV SOSY
PREFILLED_SYRINGE | INTRAVENOUS | Status: DC | PRN
  Administered 2023-03-16: 40 mg via INTRAVENOUS

## 2023-03-16 MED ORDER — SUGAMMADEX SODIUM 500 MG/5ML IV SOLN
INTRAVENOUS | Status: DC | PRN
  Administered 2023-03-16: 200 mg via INTRAVENOUS

## 2023-03-16 MED ORDER — ONDANSETRON HCL 4 MG/2ML IJ SOLN
INTRAMUSCULAR | Status: DC | PRN
  Administered 2023-03-16: 4 mg via INTRAVENOUS

## 2023-03-16 MED ORDER — CEFAZOLIN SODIUM-DEXTROSE 2-4 GM/100ML-% IV SOLN
2.0000 g | INTRAVENOUS | Status: AC
  Administered 2023-03-16: 2 g via INTRAVENOUS
  Filled 2023-03-16: qty 100

## 2023-03-16 MED ORDER — ALBUTEROL SULFATE (2.5 MG/3ML) 0.083% IN NEBU
INHALATION_SOLUTION | RESPIRATORY_TRACT | Status: AC
  Filled 2023-03-16: qty 3

## 2023-03-16 MED ORDER — PROPOFOL 10 MG/ML IV BOLUS
INTRAVENOUS | Status: DC | PRN
  Administered 2023-03-16: 100 mg via INTRAVENOUS

## 2023-03-16 MED ORDER — ACETAMINOPHEN 500 MG PO TABS
1000.0000 mg | ORAL_TABLET | Freq: Once | ORAL | Status: AC
  Administered 2023-03-16: 1000 mg via ORAL
  Filled 2023-03-16: qty 2

## 2023-03-16 MED ORDER — EPHEDRINE 5 MG/ML INJ
INTRAVENOUS | Status: AC
  Filled 2023-03-16: qty 5

## 2023-03-16 MED ORDER — VANCOMYCIN HCL 1000 MG IV SOLR
INTRAVENOUS | Status: AC
  Filled 2023-03-16: qty 20

## 2023-03-16 MED ORDER — 0.9 % SODIUM CHLORIDE (POUR BTL) OPTIME
TOPICAL | Status: DC | PRN
  Administered 2023-03-16: 1000 mL

## 2023-03-16 SURGICAL SUPPLY — 85 items
ADAPTER K-WIRE FAST GUIDE 2.0 (WIRE) IMPLANT
ADH SKN CLS APL DERMABOND .7 (GAUZE/BANDAGES/DRESSINGS)
ADPR GD 2X3.5 BSHNG KRSH (WIRE) ×2
APL PRP STRL LF DISP 70% ISPRP (MISCELLANEOUS) ×1
BIT DRILL 2.5X2.75 QC CALB (BIT) IMPLANT
BIT DRILL CALIBRATED 2.7 (BIT) IMPLANT
BNDG CMPR 5X3 KNIT ELC UNQ LF (GAUZE/BANDAGES/DRESSINGS)
BNDG CMPR 5X4 CHSV STRCH STRL (GAUZE/BANDAGES/DRESSINGS)
BNDG CMPR 5X4 KNIT ELC UNQ LF (GAUZE/BANDAGES/DRESSINGS) ×1
BNDG CMPR 9X4 STRL LF SNTH (GAUZE/BANDAGES/DRESSINGS)
BNDG COHESIVE 4X5 TAN STRL LF (GAUZE/BANDAGES/DRESSINGS) ×2 IMPLANT
BNDG ELASTIC 3INX 5YD STR LF (GAUZE/BANDAGES/DRESSINGS) IMPLANT
BNDG ELASTIC 4INX 5YD STR LF (GAUZE/BANDAGES/DRESSINGS) ×2 IMPLANT
BNDG ESMARK 4X9 LF (GAUZE/BANDAGES/DRESSINGS) IMPLANT
BNDG GAUZE DERMACEA FLUFF 4 (GAUZE/BANDAGES/DRESSINGS) ×2 IMPLANT
BNDG GZE DERMACEA 4 6PLY (GAUZE/BANDAGES/DRESSINGS)
CHLORAPREP W/TINT 26 (MISCELLANEOUS) ×2 IMPLANT
COVER MAYO STAND STRL (DRAPES) IMPLANT
CUFF TOURN SGL QUICK 18X4 (TOURNIQUET CUFF) ×2 IMPLANT
DERMABOND ADVANCED .7 DNX12 (GAUZE/BANDAGES/DRESSINGS) IMPLANT
DRAPE C-ARM 42X120 X-RAY (DRAPES) ×2 IMPLANT
DRAPE C-ARMOR (DRAPES) IMPLANT
DRAPE IMP U-DRAPE 54X76 (DRAPES) ×2 IMPLANT
DRAPE INCISE IOBAN 66X45 STRL (DRAPES) ×2 IMPLANT
DRAPE ORTHO SPLIT 77X108 STRL (DRAPES)
DRAPE SURG ORHT 6 SPLT 77X108 (DRAPES) IMPLANT
DRAPE U-SHAPE 76X120 STRL (DRAPES) IMPLANT
DRSG ADAPTIC 3X8 NADH LF (GAUZE/BANDAGES/DRESSINGS) IMPLANT
DRSG AQUACEL AG ADV 3.5X 6 (GAUZE/BANDAGES/DRESSINGS) IMPLANT
ELECT REM PT RETURN 15FT ADLT (MISCELLANEOUS) ×2 IMPLANT
GAUZE PAD ABD 8X10 STRL (GAUZE/BANDAGES/DRESSINGS) IMPLANT
GAUZE SPONGE 4X4 12PLY STRL (GAUZE/BANDAGES/DRESSINGS) ×2 IMPLANT
GAUZE XEROFORM 1X8 LF (GAUZE/BANDAGES/DRESSINGS) ×2 IMPLANT
GLOVE BIOGEL PI IND STRL 8 (GLOVE) ×2 IMPLANT
GLOVE SURG ORTHO 8.0 STRL STRW (GLOVE) ×4 IMPLANT
GOWN STRL REUS W/ TWL LRG LVL3 (GOWN DISPOSABLE) ×2 IMPLANT
GOWN STRL REUS W/ TWL XL LVL3 (GOWN DISPOSABLE) ×2 IMPLANT
GOWN STRL REUS W/TWL LRG LVL3 (GOWN DISPOSABLE) ×1
GOWN STRL REUS W/TWL XL LVL3 (GOWN DISPOSABLE) ×1
K-WIRE FIXATION 2.0X6 (WIRE) ×2
KIT BASIN OR (CUSTOM PROCEDURE TRAY) ×2 IMPLANT
KIT TURNOVER KIT A (KITS) IMPLANT
KWIRE FIXATION 2.0X6 (WIRE) IMPLANT
NDL HYPO 22X1.5 SAFETY MO (MISCELLANEOUS) IMPLANT
NDL HYPO 25X1 1.5 SAFETY (NEEDLE) IMPLANT
NEEDLE HYPO 22X1.5 SAFETY MO (MISCELLANEOUS) IMPLANT
NEEDLE HYPO 25X1 1.5 SAFETY (NEEDLE) IMPLANT
NS IRRIG 1000ML POUR BTL (IV SOLUTION) ×2 IMPLANT
PACK ORTHO EXTREMITY (CUSTOM PROCEDURE TRAY) ×2 IMPLANT
PAD CAST 3X4 CTTN HI CHSV (CAST SUPPLIES) IMPLANT
PAD CAST 4YDX4 CTTN HI CHSV (CAST SUPPLIES) ×2 IMPLANT
PADDING CAST ABS COTTON 4X4 ST (CAST SUPPLIES) ×2 IMPLANT
PADDING CAST COTTON 3X4 STRL (CAST SUPPLIES)
PADDING CAST COTTON 4X4 STRL (CAST SUPPLIES)
PADDING CAST SYNTHETIC 4X4 STR (CAST SUPPLIES) IMPLANT
PLATE OLECRANON SM (Plate) IMPLANT
SCREW CORTICAL LOW PROF 3.5X20 (Screw) IMPLANT
SCREW LOCK 3.5X20 DIST TIB (Screw) IMPLANT
SCREW LOCK 3.5X48 DIST TIB (Screw) IMPLANT
SCREW LOCK CORT STAR 3.5X12 (Screw) IMPLANT
SCREW LOCK CORT STAR 3.5X14 (Screw) IMPLANT
SCREW LOCK CORT STAR 3.5X20 (Screw) IMPLANT
SCREW LOW PROFILE 22MMX3.5MM (Screw) IMPLANT
SLEEVE SCD COMPRESS KNEE MED (STOCKING) ×2 IMPLANT
SPLINT PLASTER CAST XFAST 4X15 (CAST SUPPLIES) IMPLANT
SPLINT PLASTER CAST XFAST 5X30 (CAST SUPPLIES) IMPLANT
STOCKINETTE 4X48 STRL (DRAPES) IMPLANT
STOCKINETTE 8 INCH (MISCELLANEOUS) ×2 IMPLANT
SUCTION FRAZIER HANDLE 10FR (MISCELLANEOUS) ×1
SUCTION TUBE FRAZIER 10FR DISP (MISCELLANEOUS) ×2 IMPLANT
SUT ETHILON 3 0 PS 1 (SUTURE) IMPLANT
SUT MAXBRAID #2 CVD NDL (SUTURE) IMPLANT
SUT MNCRL AB 3-0 PS2 27 (SUTURE) ×2 IMPLANT
SUT VIC AB 2-0 CT1 27 (SUTURE)
SUT VIC AB 2-0 CT1 TAPERPNT 27 (SUTURE) IMPLANT
SUT VIC AB 2-0 CT2 27 (SUTURE) IMPLANT
SUT VIC AB 2-0 SH 18 (SUTURE) IMPLANT
SUT VICRYL 0 27 CT2 27 ABS (SUTURE) IMPLANT
TOWEL GREEN STERILE FF (TOWEL DISPOSABLE) ×4 IMPLANT
TRAY PREMIUM WET SKIN SCRUB (MISCELLANEOUS) ×2 IMPLANT
TUBE CONNECTING 12X1/4 (SUCTIONS) ×2 IMPLANT
TUBE SUCTION HIGH CAP CLEAR NV (SUCTIONS) IMPLANT
UNDERPAD 30X36 HEAVY ABSORB (UNDERPADS AND DIAPERS) ×2 IMPLANT
WASHER 3.5MM (Orthopedic Implant) IMPLANT
YANKAUER SUCT BULB TIP NO VENT (SUCTIONS) ×2 IMPLANT

## 2023-03-16 NOTE — Transfer of Care (Signed)
Immediate Anesthesia Transfer of Care Note  Patient: Rhonda Contreras  Procedure(s) Performed: OPEN REDUCTION INTERNAL FIXATION (ORIF) ELBOW/OLECRANON FRACTURE (Right: Elbow)  Patient Location: PACU  Anesthesia Type:General  Level of Consciousness: awake, alert , and oriented  Airway & Oxygen Therapy: Patient Spontanous Breathing and Patient connected to face mask oxygen  Post-op Assessment: Report given to RN and Post -op Vital signs reviewed and stable  Post vital signs: Reviewed and stable  Last Vitals:  Vitals Value Taken Time  BP    Temp    Pulse 92 03/16/23 1316  Resp 21 03/16/23 1316  SpO2 98 % 03/16/23 1316  Vitals shown include unvalidated device data.  Last Pain:  Vitals:   03/16/23 1045  TempSrc:   PainSc: 0-No pain         Complications: No notable events documented.

## 2023-03-16 NOTE — Progress Notes (Signed)
Called and updated nephew Mr. Andrey Campanile.

## 2023-03-16 NOTE — Anesthesia Preprocedure Evaluation (Addendum)
Anesthesia Evaluation  Patient identified by MRN, date of birth, ID band Patient awake    Reviewed: Allergy & Precautions, NPO status , Patient's Chart, lab work & pertinent test results, reviewed documented beta blocker date and time   History of Anesthesia Complications Negative for: history of anesthetic complications  Airway Mallampati: III  TM Distance: >3 FB Neck ROM: Limited    Dental  (+) Dental Advisory Given   Pulmonary neg pulmonary ROS   Pulmonary exam normal        Cardiovascular hypertension, Pt. on medications and Pt. on home beta blockers Normal cardiovascular exam     Neuro/Psych CVA (mild RUE weakness from stroke, no changes since fracture), Residual Symptoms  negative psych ROS   GI/Hepatic negative GI ROS, Neg liver ROS,,,  Endo/Other  diabetes, Type 2, Oral Hypoglycemic Agents    Renal/GU CRFRenal disease     Musculoskeletal  (+) Arthritis ,    Abdominal   Peds  Hematology  On coumadin    Anesthesia Other Findings SLE  Reproductive/Obstetrics                              Anesthesia Physical Anesthesia Plan  ASA: 3  Anesthesia Plan: General   Post-op Pain Management: Regional block* and Tylenol PO (pre-op)*   Induction: Intravenous  PONV Risk Score and Plan: 3 and Treatment may vary due to age or medical condition, Ondansetron and Dexamethasone  Airway Management Planned: Oral ETT and Video Laryngoscope Planned  Additional Equipment: None  Intra-op Plan:   Post-operative Plan: Extubation in OR  Informed Consent: I have reviewed the patients History and Physical, chart, labs and discussed the procedure including the risks, benefits and alternatives for the proposed anesthesia with the patient or authorized representative who has indicated his/her understanding and acceptance.     Dental advisory given  Plan Discussed with: CRNA and  Anesthesiologist  Anesthesia Plan Comments:          Anesthesia Quick Evaluation

## 2023-03-16 NOTE — Op Note (Signed)
03/16/2023  12:50 PM  PATIENT:  Rhonda Contreras    PRE-OPERATIVE DIAGNOSIS:  RIGHT DISPLACED OLECRANON FRACTURE  POST-OPERATIVE DIAGNOSIS:  Same  PROCEDURE:  OPEN REDUCTION INTERNAL FIXATION (ORIF) ELBOW/OLECRANON FRACTURE  SURGEON:  Ziyad Dyar A Rahmir Beever, MD Co-surgeon: Ramond Marrow, MD  PHYSICIAN ASSISTANT: Kathie Dike, PA-C, present and scrubbed throughout the case, critical for completion in a timely fashion, and for retraction, instrumentation, and closure.  ANESTHESIA:   General  PREOPERATIVE INDICATIONS:  Rhonda Contreras is a  72 y.o. female with a diagnosis of RIGHT OLECRANON FRACTURE who failed conservative measures and elected for surgical management.    The risks benefits and alternatives were discussed with the patient preoperatively including but not limited to the risks of infection, bleeding, nerve injury, cardiopulmonary complications, the need for revision surgery, among others, and the patient was willing to proceed.  ESTIMATED BLOOD LOSS: 50cc  OPERATIVE IMPLANTS: [employer] olecranon plate Implant Name Type Inv. Item Serial No. Manufacturer Lot No. LRB No. Used Action  PLATE OLECRANON SM - [id number] Plate PLATE OLECRANON SM  [employer](ORTH,TRAU,BIO,SG)  Right 1 Implanted  WASHER 3.5MM - [id number] Orthopedic Implant WASHER 3.5MM  [employer](ORTH,TRAU,BIO,SG)  Right 1 Implanted  SCREW LOW PROFILE 22MMX3. - [id number] Screw SCREW LOW PROFILE 22MMX3.  [employer](ORTH,TRAU,BIO,SG)  Right 1 Implanted  SCREW LOCK 3.5X20 DIST TIB - [id number] Screw SCREW LOCK 3.5X20 DIST TIB  [employer]ON(ORTH,TRAU,BIO,SG)  Right 1 Implanted  SCREW LOCK CORT STAR 3.5X20 - [id number] Screw SCREW LOCK CORT STAR 3.5X20  [employer]ON(ORTH,TRAU,BIO,SG)  Right 1 Implanted  SCREW LOCK 3.5X48 DIST TIB - [id number] Screw SCREW LOCK 3.5X48 DIST TIB  [employer](ORTH,TRAU,BIO,SG)  Right 1 Implanted  SCREW CORTICAL LOW PROF 3.5X20 - [id number] Screw SCREW CORTICAL LOW PROF 3.5X20   [employer](ORTH,TRAU,BIO,SG)  Right 1 Implanted  SCREW LOCK CORT STAR 3.5X14 - [id number] Screw SCREW LOCK CORT STAR 3.5X14  [employer]ON(ORTH,TRAU,BIO,SG)  Right 1 Implanted  SCREW LOCK CORT STAR 3.5X12 - [id number] Screw SCREW LOCK CORT STAR 3.5X12  [employer](ORTH,TRAU,BIO,SG)  Right 1 Implanted    OPERATIVE FINDINGS: Comminuted and displaced intra-articular olecranon fracture: excellent reduction and fixation.  OPERATIVE PROCEDURE:  The patient was identified in the preoperative holding area. Consent was confirmed with the patient and their family and all questions were answered. The operative extremity was marked after confirmation with the patient. The patient was then brought back to the operating room by our anesthesia colleagues.  The patient was then placed under general anesthetic and carried over to a radiolucent flat top table.  Here they were placed in the lateral decubitus position with the operative side up.  The down arm was well padded and positioned out of the way of fluoroscopy. The operative arm was then draped over a bone foam. The operative extremity was then prepped and draped in usual sterile fashion. A preoperative timeout was performed to verify the patient, the procedure, and the extremity. Preoperative antibiotics were dosed.   A standard posterior approach to the olecranon was made.  It was carried down through skin and subcutaneous tissue.  The fracture with was exposed.  Periosteum was removed from the fracture edges.  The hematoma was cleaned out and irrigated.  The incision was extended between the FCU and ECU down to the ulna.  The periosteum was reflected off of the ulnar shaft to be able to place the olecranon plate.  Once the fracture was cleaned out a towel clip was used to manipulate the proximal fragment with the  triceps attachment.   A reduction forceps was placed in this hole and the other tine was placed along the proximal end of the fracture fragment.  The  fracture was reduced and then pinned in place with a number of 1.6 mm K wires.   A  proximal ulna locking plate was then contoured to fit along the olecranon.  The triceps was split to allow the plate to be more flush to bone.  The reduction clamp was used to compress the plate down to the proximal segment.  A nonlocking plate screw was placed in the shaft through the oblong hole.  The plate was then tamped down to allow for some compression and reduction at the fracture site.  Locking screws x 3 were placed proximally.  I then placed 2 additional locking screws distally in the cortical bone as well as a nonlocking screw in the most distal hole.   Final fluoroscopic images were obtained.  The incision was thoroughly irrigated.  A gram of vancomycin powder was placed into the incision.  A #2 maxibraid locking stitch was placed through the triceps using a Krakw technique which was tied down to the shaft portion of the plate.  A 0 Vicryl was used to close the soft tissue over the plate.  The skin was then closed with 2-0 Vicryl and 3-0 nylon.  A sterile dressing consisting of Adaptic, 4 x 4 gauze, ABD, Webril.  The arm was placed in a posterior  long-arm splint.  She was placed supine, extubated and taken to the PACU in stable condition.  Post op recs: WB: NWB RUE Abx: ancef Imaging: PACU xrays Dressing: keep intact until follow up, change PRN if soiled or saturated. DVT prophylaxis: lovenox starting POD1 x4 weeks Follow up: 2 weeks after surgery for a wound check with Dr. Blanchie Dessert at [facility].  Address: [address], [place], [place] [address]  Office Phone: [phone]   Weber Cooks, MD Orthopaedic Surgery

## 2023-03-16 NOTE — Discharge Instructions (Signed)
Orthopedic Discharge Instructions  Diet: As you were doing prior to hospitalization   Shower:  May shower but keep the wounds dry, use an occlusive plastic wrap, NO SOAKING IN TUB.  If the bandage gets wet, change with a clean dry gauze.  If you have a splint on, leave the splint in place and keep the splint dry with a plastic bag.  Dressing:  You may change your dressing 3-5 days after surgery, unless you have a splint.  If the dressing remains clean and dry it can also be left on until follow up. If you change the dressing replace with clean gauze and tape or ace wrap. If you have a splint, then just leave the splint in place and we will change your bandages during your first follow-up appointment.  If water gets in the splint or the splint gets saturated please call the clinic and we can see you to change your splint.  If you had hand or foot surgery, we will plan to remove your stitches in about 2 weeks in the office.  For all other surgeries, there are sticky tapes (steri-strips) on your wounds and all the stitches are absorbable.  Leave the steri-strips in place when changing your dressings, they will peel off with time, usually 2-3 weeks.  Activity:  Increase activity slowly as tolerated, but follow the weight bearing instructions below.  The rules on driving is that you can not be taking narcotics while you drive, and you must feel in control of the vehicle.    Weight Bearing:   Do not bear any weight on the right extremity for at least 2 weeks, this will be re-evaluated at follow up.    Blood clot prevention (DVT Prophylaxis): After surgery you are at an increased risk for a blood clot. You are to resume your Coumadin starting the day after surgery to help reduce your risk of getting a blood clot. This will help prevent a blood clot. Signs of a pulmonary embolus (blood clot in the lungs) include sudden short of breath, feeling lightheaded or dizzy, chest pain with a deep breath, rapid pulse  rapid breathing. Signs of a blood clot in your arms or legs include new unexplained swelling and cramping, warm, red or darkened skin around the painful area. Please call the office or 911 right away if these signs or symptoms develop.  To prevent constipation: you may use a stool softener such as - Colace (over the counter) 100 mg by mouth twice a day  Drink plenty of fluids (prune juice may be helpful) and high fiber foods Miralax (over the counter) for constipation as needed.    Itching:  If you experience itching with your medications, try taking only a single pain pill, or even half a pain pill at a time.  You may take up to 10 pain pills per day, and you can also use benadryl over the counter for itching or also to help with sleep.   Precautions:  If you experience chest pain or shortness of breath - call 911 immediately for transfer to the hospital emergency department!!   Call office [phone]) for the following: Temperature greater than 101F Persistent nausea and vomiting Severe uncontrolled pain Redness, tenderness, or signs of infection (pain, swelling, redness, odor or green/yellow discharge around the site) Difficulty breathing, headache or visual disturbances Hives Persistent dizziness or light-headedness Extreme fatigue Any other questions or concerns you may have after discharge  In an emergency, call 911 or go to an  Emergency Department at a nearby hospital  Follow- Up Appointment:  Please call for an appointment to be seen approximately 2-3 week after surgery in [facility] with your surgeon Dr. Weber Cooks - [phone] Address: [address], [place], [place] [address]

## 2023-03-16 NOTE — Progress Notes (Signed)
Called to remind OR to pass along to Dr. Blanchie Dessert that I need a dishcharge order for this patient.

## 2023-03-16 NOTE — Progress Notes (Signed)
No post op orders in for patient.  Called in to OR room where Dr. Blanchie Dessert is and patient will be discharged to home.  Will have to wait for orders.

## 2023-03-16 NOTE — Anesthesia Procedure Notes (Signed)
Anesthesia Regional Block: Supraclavicular block   Pre-Anesthetic Checklist: , timeout performed,  Correct Patient, Correct Site, Correct Laterality,  Correct Procedure, Correct Position, site marked,  Risks and benefits discussed,  Surgical consent,  Pre-op evaluation,  At surgeon's request and post-op pain management  Laterality: Right  Prep: chloraprep       Needles:  Injection technique: Single-shot  Needle Type: Echogenic Needle     Needle Length: 5cm  Needle Gauge: 21     Additional Needles:   Narrative:  Start time: 03/16/2023 10:38 AM End time: 03/16/2023 10:41 AM Injection made incrementally with aspirations every 5 mL.  Performed by: Personally  Anesthesiologist: Beryle Lathe, MD  Additional Notes: No pain on injection. No increased resistance to injection. Injection made in 5cc increments. Good needle visualization. Patient tolerated the procedure well.

## 2023-03-16 NOTE — Progress Notes (Signed)
Notified MDA patient's sats are staying between 88-91 on RA.  Patient has been working diligently with incentive spirometer with not much result.  Patient is doing well otherwise.  Patient is suppose to go home.  MDA will come to bedside.

## 2023-03-16 NOTE — H&P (Signed)
ORTHOPAEDIC H&P  Chief Complaint: R elbow fracture  HPI: Rhonda Contreras is a 72 y.o. female who sustained a fall last week onto her right arm. Was seen by her PCP where xrays showed an olecranon fracture. She is on coumadin which has been held since last week. She denies pain in other joints or extremities. She denies distal n/t.  Past Medical History:  Diagnosis Date   Arthritis    Cancer (HCC)    skin   Diabetes mellitus without complication (HCC)    Hypertension    Immune deficiency disorder (HCC)    Lupus (systemic lupus erythematosus) (HCC)    Renal disorder    Stage 2 KD   Stroke [facility])    2009   Past Surgical History:  Procedure Laterality Date   CATARACT EXTRACTION Bilateral    FEMUR IM NAIL Left 02/02/2018   Procedure: LEFT FEMUR INTRAMEDULLARY (IM) NAIL;  Surgeon: Sheral Apley, MD;  Location: [facility] OR;  Service: Orthopedics;  Laterality: Left;   TONSILLECTOMY     Social History   Socioeconomic History   Marital status: Single    Spouse name: Not on file   Number of children: Not on file   Years of education: Not on file   Highest education level: Not on file  Occupational History   Not on file  Tobacco Use   Smoking status: Never   Smokeless tobacco: Never  Vaping Use   Vaping Use: Never used  Substance and Sexual Activity   Alcohol use: Not Currently   Drug use: Never   Sexual activity: Not Currently  Other Topics Concern   Not on file  Social History Narrative   Not on file   Social Determinants of Health   Financial Resource Strain: Not on file  Food Insecurity: No Food Insecurity (05/13/2020)   Hunger Vital Sign    Worried About Running Out of Food in the Last Year: Never true    Ran Out of Food in the Last Year: Never true  Transportation Needs: No Transportation Needs (05/13/2020)   PRAPARE - Administrator, Civil Service (Medical): No    Lack of Transportation (Non-Medical): No  Physical Activity: Not on file  Stress: Not on  file  Social Connections: Not on file   Family History  Problem Relation Age of Onset   Heart attack Mother    Allergies  Allergen Reactions   Tape Other (See Comments)    CERTAIN MEDICAL TAPES HURT AND BURN THE SKIN     Positive ROS: All other systems have been reviewed and were otherwise negative with the exception of those mentioned in the HPI and as above.  Physical Exam: General: Alert, no acute distress Cardiovascular: No pedal edema Respiratory: No cyanosis, no use of accessory musculature Skin: No lesions in the area of chief complaint Neurologic: Sensation intact distally Psychiatric: Patient is competent for consent with normal mood and affect  MUSCULOSKELETAL:  RUE No traumatic wounds, ecchymosis, or rash  Swelling and brusing around the elbow  Sens median, radial, ulnar intact  Motor AIN, PIN, IO intact  Radial pulse 2+, No significant edema    IMAGING: xrays demonstrate displaced R olecranon fracture  Assessment: Right displaced olecranon fracture  Plan: The risks benefits and alternatives were discussed with the patient including but not limited to the risks of nonoperative treatment, versus surgical intervention including infection, bleeding, nerve injury, malunion, nonunion, the need for revision surgery, hardware prominence, hardware failure, the need for hardware  removal, blood clots, cardiopulmonary complications, morbidity, mortality, among others, and they were willing to proceed.  Consent was signed by myself and the patient .  Right arm was marked.    [name], MD  Contact information:   [email or web address] 7am-5pm epic message Dr. Blanchie Dessert, or call office for patient follow up: [phone] After hours and holidays please check [email or web address] for group call information for [facility]

## 2023-03-16 NOTE — Progress Notes (Signed)
MDA at bedside.  MDA listened to patient, reviewed chart and instructed patient on sleeping in a chair upright etc once she went home.  Patient voiced understanding of eduction.  MDA wants albuterol neb administered in PACU and MDA is ok for patient to be discharged with sats >88.  This RN will administer neb and continue to monitor patient.

## 2023-03-16 NOTE — Progress Notes (Signed)
Central waiting called to check on patient for family.  Update given, attempting to increase patient sats, and waiting on orders from MD.  Family request to come to bedside.  Patient is still in Phase I area and cannot come in the PACU at this time.

## 2023-03-17 NOTE — Anesthesia Postprocedure Evaluation (Signed)
Anesthesia Post Note  Patient: [name]  Procedure(s) Performed: OPEN REDUCTION INTERNAL FIXATION (ORIF) ELBOW/OLECRANON FRACTURE (Right: Elbow)     Patient location during evaluation: PACU Anesthesia Type: General Level of consciousness: awake and alert Pain management: pain level controlled Vital Signs Assessment: post-procedure vital signs reviewed and stable Respiratory status: spontaneous breathing, nonlabored ventilation, respiratory function stable and patient connected to nasal cannula oxygen Cardiovascular status: blood pressure returned to baseline, stable and tachycardic Postop Assessment: no apparent nausea or vomiting Anesthetic complications: no   No notable events documented.  Last Vitals:  Vitals:   03/16/23 1557 03/16/23 1600  BP: 114/74   Pulse: (!) 110 (!) 110  Resp: (!) 23 (!) 23  Temp: 36.6 C   SpO2: 90% 90%    Last Pain:  Vitals:   03/16/23 1415  TempSrc:   PainSc: 0-No pain                 Rhonda Contreras

## 2023-03-22 DIAGNOSIS — Z7901 Long term (current) use of anticoagulants: Secondary | ICD-10-CM | POA: Diagnosis not present

## 2023-03-24 ENCOUNTER — Encounter (HOSPITAL_COMMUNITY): Payer: Self-pay | Admitting: Orthopedic Surgery

## 2023-03-25 DIAGNOSIS — Z7901 Long term (current) use of anticoagulants: Secondary | ICD-10-CM | POA: Diagnosis not present

## 2023-03-29 DIAGNOSIS — S52031D Displaced fracture of olecranon process with intraarticular extension of right ulna, subsequent encounter for closed fracture with routine healing: Secondary | ICD-10-CM | POA: Diagnosis not present

## 2023-03-31 DIAGNOSIS — Z7901 Long term (current) use of anticoagulants: Secondary | ICD-10-CM | POA: Diagnosis not present

## 2023-04-07 DIAGNOSIS — Z7901 Long term (current) use of anticoagulants: Secondary | ICD-10-CM | POA: Diagnosis not present

## 2023-04-19 DIAGNOSIS — E782 Mixed hyperlipidemia: Secondary | ICD-10-CM | POA: Diagnosis not present

## 2023-04-19 DIAGNOSIS — Z7901 Long term (current) use of anticoagulants: Secondary | ICD-10-CM | POA: Diagnosis not present

## 2023-04-19 DIAGNOSIS — M810 Age-related osteoporosis without current pathological fracture: Secondary | ICD-10-CM | POA: Diagnosis not present

## 2023-04-19 DIAGNOSIS — I69351 Hemiplegia and hemiparesis following cerebral infarction affecting right dominant side: Secondary | ICD-10-CM | POA: Diagnosis not present

## 2023-04-19 DIAGNOSIS — E1122 Type 2 diabetes mellitus with diabetic chronic kidney disease: Secondary | ICD-10-CM | POA: Diagnosis not present

## 2023-04-19 DIAGNOSIS — I10 Essential (primary) hypertension: Secondary | ICD-10-CM | POA: Diagnosis not present

## 2023-04-19 DIAGNOSIS — L719 Rosacea, unspecified: Secondary | ICD-10-CM | POA: Diagnosis not present

## 2023-04-19 DIAGNOSIS — N1831 Chronic kidney disease, stage 3a: Secondary | ICD-10-CM | POA: Diagnosis not present

## 2023-04-19 DIAGNOSIS — D6862 Lupus anticoagulant syndrome: Secondary | ICD-10-CM | POA: Diagnosis not present

## 2023-04-19 DIAGNOSIS — Z0000 Encounter for general adult medical examination without abnormal findings: Secondary | ICD-10-CM | POA: Diagnosis not present

## 2023-04-19 DIAGNOSIS — I700 Atherosclerosis of aorta: Secondary | ICD-10-CM | POA: Diagnosis not present

## 2023-04-22 ENCOUNTER — Encounter (HOSPITAL_COMMUNITY): Payer: Self-pay

## 2023-04-22 ENCOUNTER — Other Ambulatory Visit: Payer: Self-pay

## 2023-04-22 ENCOUNTER — Observation Stay (HOSPITAL_COMMUNITY): Payer: Medicare HMO

## 2023-04-22 ENCOUNTER — Emergency Department (HOSPITAL_COMMUNITY): Payer: Medicare HMO

## 2023-04-22 ENCOUNTER — Observation Stay (HOSPITAL_COMMUNITY)
Admission: EM | Admit: 2023-04-22 | Discharge: 2023-04-24 | Disposition: A | Discharge status: Home / Self Care | Payer: Medicare HMO | Attending: Internal Medicine | Admitting: Internal Medicine

## 2023-04-22 DIAGNOSIS — I10 Essential (primary) hypertension: Secondary | ICD-10-CM | POA: Diagnosis present

## 2023-04-22 DIAGNOSIS — Z7984 Long term (current) use of oral hypoglycemic drugs: Secondary | ICD-10-CM | POA: Diagnosis not present

## 2023-04-22 DIAGNOSIS — G319 Degenerative disease of nervous system, unspecified: Secondary | ICD-10-CM | POA: Diagnosis not present

## 2023-04-22 DIAGNOSIS — I63411 Cerebral infarction due to embolism of right middle cerebral artery: Secondary | ICD-10-CM

## 2023-04-22 DIAGNOSIS — R000 Tachycardia, unspecified: Secondary | ICD-10-CM | POA: Diagnosis not present

## 2023-04-22 DIAGNOSIS — R4701 Aphasia: Secondary | ICD-10-CM | POA: Insufficient documentation

## 2023-04-22 DIAGNOSIS — I639 Cerebral infarction, unspecified: Secondary | ICD-10-CM | POA: Diagnosis present

## 2023-04-22 DIAGNOSIS — I700 Atherosclerosis of aorta: Secondary | ICD-10-CM | POA: Diagnosis not present

## 2023-04-22 DIAGNOSIS — E119 Type 2 diabetes mellitus without complications: Secondary | ICD-10-CM

## 2023-04-22 DIAGNOSIS — Z743 Need for continuous supervision: Secondary | ICD-10-CM | POA: Diagnosis not present

## 2023-04-22 DIAGNOSIS — I672 Cerebral atherosclerosis: Secondary | ICD-10-CM | POA: Diagnosis not present

## 2023-04-22 DIAGNOSIS — I499 Cardiac arrhythmia, unspecified: Secondary | ICD-10-CM | POA: Diagnosis not present

## 2023-04-22 DIAGNOSIS — Z85828 Personal history of other malignant neoplasm of skin: Secondary | ICD-10-CM | POA: Insufficient documentation

## 2023-04-22 DIAGNOSIS — Z7901 Long term (current) use of anticoagulants: Secondary | ICD-10-CM | POA: Diagnosis not present

## 2023-04-22 DIAGNOSIS — I129 Hypertensive chronic kidney disease with stage 1 through stage 4 chronic kidney disease, or unspecified chronic kidney disease: Secondary | ICD-10-CM | POA: Diagnosis not present

## 2023-04-22 DIAGNOSIS — R29810 Facial weakness: Secondary | ICD-10-CM

## 2023-04-22 DIAGNOSIS — G459 Transient cerebral ischemic attack, unspecified: Secondary | ICD-10-CM | POA: Diagnosis not present

## 2023-04-22 DIAGNOSIS — I6523 Occlusion and stenosis of bilateral carotid arteries: Secondary | ICD-10-CM | POA: Diagnosis not present

## 2023-04-22 DIAGNOSIS — I63512 Cerebral infarction due to unspecified occlusion or stenosis of left middle cerebral artery: Secondary | ICD-10-CM | POA: Diagnosis not present

## 2023-04-22 DIAGNOSIS — Z79899 Other long term (current) drug therapy: Secondary | ICD-10-CM | POA: Insufficient documentation

## 2023-04-22 DIAGNOSIS — E1122 Type 2 diabetes mellitus with diabetic chronic kidney disease: Secondary | ICD-10-CM | POA: Insufficient documentation

## 2023-04-22 DIAGNOSIS — E042 Nontoxic multinodular goiter: Secondary | ICD-10-CM | POA: Diagnosis not present

## 2023-04-22 DIAGNOSIS — R4781 Slurred speech: Secondary | ICD-10-CM | POA: Diagnosis not present

## 2023-04-22 DIAGNOSIS — N1831 Chronic kidney disease, stage 3a: Secondary | ICD-10-CM | POA: Diagnosis not present

## 2023-04-22 DIAGNOSIS — D6862 Lupus anticoagulant syndrome: Secondary | ICD-10-CM | POA: Diagnosis present

## 2023-04-22 LAB — COMPREHENSIVE METABOLIC PANEL
ALT: 24 U/L (ref 0–44)
AST: 22 U/L (ref 15–41)
Albumin: 3.8 g/dL (ref 3.5–5.0)
Alkaline Phosphatase: 88 U/L (ref 38–126)
Anion gap: 12 (ref 5–15)
BUN: 17 mg/dL (ref 8–23)
CO2: 20 mmol/L — ABNORMAL LOW (ref 22–32)
Calcium: 9.7 mg/dL (ref 8.9–10.3)
Chloride: 107 mmol/L (ref 98–111)
Creatinine, Ser: 0.81 mg/dL (ref 0.44–1.00)
GFR, Estimated: >60 mL/min (ref >60)
Glucose, Bld: 176 mg/dL — ABNORMAL HIGH (ref 70–99)
Potassium: 4 mmol/L (ref 3.5–5.1)
Sodium: 139 mmol/L (ref 135–145)
Total Bilirubin: 0.2 mg/dL — ABNORMAL LOW (ref 0.3–1.2)
Total Protein: 7.6 g/dL (ref 6.5–8.1)

## 2023-04-22 LAB — URINALYSIS, ROUTINE W REFLEX MICROSCOPIC
Bilirubin Urine: NEGATIVE
Glucose, UA: >=500 mg/dL — AB
Ketones, ur: NEGATIVE mg/dL
Leukocytes,Ua: NEGATIVE
Nitrite: NEGATIVE
Protein, ur: 100 mg/dL — AB
Specific Gravity, Urine: 1.029 (ref 1.005–1.030)
pH: 5 (ref 5.0–8.0)

## 2023-04-22 LAB — CBC
HCT: 42.8 % (ref 36.0–46.0)
Hemoglobin: 13.6 g/dL (ref 12.0–15.0)
MCH: 29.4 pg (ref 26.0–34.0)
MCHC: 31.8 g/dL (ref 30.0–36.0)
MCV: 92.6 fL (ref 80.0–100.0)
Platelets: 187 10*3/uL (ref 150–400)
RBC: 4.62 MIL/uL (ref 3.87–5.11)
RDW: 14.4 % (ref 11.5–15.5)
WBC: 9.5 10*3/uL (ref 4.0–10.5)
nRBC: 0 % (ref 0.0–0.2)

## 2023-04-22 LAB — RAPID URINE DRUG SCREEN, HOSP PERFORMED
Amphetamines: NOT DETECTED
Barbiturates: NOT DETECTED
Benzodiazepines: NOT DETECTED
Cocaine: NOT DETECTED
Opiates: NOT DETECTED
Tetrahydrocannabinol: NOT DETECTED

## 2023-04-22 LAB — DIFFERENTIAL
Abs Immature Granulocytes: 0.06 10*3/uL (ref 0.00–0.07)
Basophils Absolute: 0 10*3/uL (ref 0.0–0.1)
Basophils Relative: 0 %
Eosinophils Absolute: 0.2 10*3/uL (ref 0.0–0.5)
Eosinophils Relative: 2 %
Immature Granulocytes: 1 %
Lymphocytes Relative: 12 %
Lymphs Abs: 1.2 10*3/uL (ref 0.7–4.0)
Monocytes Absolute: 0.7 10*3/uL (ref 0.1–1.0)
Monocytes Relative: 8 %
Neutro Abs: 7.3 10*3/uL (ref 1.7–7.7)
Neutrophils Relative %: 77 %

## 2023-04-22 LAB — I-STAT CHEM 8, ED
BUN: 18 mg/dL (ref 8–23)
Calcium, Ion: 1.14 mmol/L — ABNORMAL LOW (ref 1.15–1.40)
Chloride: 109 mmol/L (ref 98–111)
Creatinine, Ser: 0.7 mg/dL (ref 0.44–1.00)
Glucose, Bld: 179 mg/dL — ABNORMAL HIGH (ref 70–99)
HCT: 43 % (ref 36.0–46.0)
Hemoglobin: 14.6 g/dL (ref 12.0–15.0)
Potassium: 3.9 mmol/L (ref 3.5–5.1)
Sodium: 141 mmol/L (ref 135–145)
TCO2: 22 mmol/L (ref 22–32)

## 2023-04-22 LAB — ETHANOL: Alcohol, Ethyl (B): <10 mg/dL (ref <10)

## 2023-04-22 LAB — APTT: aPTT: 64 seconds — ABNORMAL HIGH (ref 24–36)

## 2023-04-22 LAB — PROTIME-INR
INR: 2 — ABNORMAL HIGH (ref 0.8–1.2)
Prothrombin Time: 23.1 seconds — ABNORMAL HIGH (ref 11.4–15.2)

## 2023-04-22 LAB — CBG MONITORING, ED: Glucose-Capillary: 180 mg/dL — ABNORMAL HIGH (ref 70–99)

## 2023-04-22 MED ORDER — IOHEXOL 350 MG/ML SOLN
100.0000 mL | Freq: Once | INTRAVENOUS | Status: AC | PRN
  Administered 2023-04-22: 100 mL via INTRAVENOUS

## 2023-04-22 MED ORDER — ACETAMINOPHEN 325 MG PO TABS: 650.0000 mg | ORAL_TABLET | ORAL | Status: DC | PRN

## 2023-04-22 MED ORDER — METOPROLOL TARTRATE 25 MG PO TABS
100.0000 mg | ORAL_TABLET | Freq: Two times a day (BID) | ORAL | Status: DC
  Administered 2023-04-22 – 2023-04-24 (×4): 100 mg via ORAL
  Filled 2023-04-22 (×4): qty 4

## 2023-04-22 MED ORDER — ASPIRIN 325 MG PO TABS
325.0000 mg | ORAL_TABLET | Freq: Once | ORAL | Status: AC
  Administered 2023-04-22: 325 mg via ORAL
  Filled 2023-04-22: qty 1

## 2023-04-22 MED ORDER — ALENDRONATE SODIUM 70 MG PO TABS
70.0000 mg | ORAL_TABLET | ORAL | Status: DC
  Administered 2023-04-23: 70 mg via ORAL
  Filled 2023-04-22: qty 1

## 2023-04-22 MED ORDER — ACETAMINOPHEN 650 MG RE SUPP: 650.0000 mg | RECTAL | Status: DC | PRN

## 2023-04-22 MED ORDER — WARFARIN SODIUM 2.5 MG PO TABS: 2.5000 mg | ORAL_TABLET | Freq: Every day | ORAL | Status: DC

## 2023-04-22 MED ORDER — ACETAMINOPHEN 160 MG/5ML PO SOLN: 650.0000 mg | ORAL | Status: DC | PRN

## 2023-04-22 MED ORDER — ATORVASTATIN CALCIUM 10 MG PO TABS
20.0000 mg | ORAL_TABLET | Freq: Every day | ORAL | Status: DC
  Administered 2023-04-23 – 2023-04-24 (×2): 20 mg via ORAL
  Filled 2023-04-22 (×2): qty 2

## 2023-04-22 MED ORDER — WARFARIN SODIUM 5 MG PO TABS
5.0000 mg | ORAL_TABLET | Freq: Once | ORAL | Status: AC
  Administered 2023-04-22: 5 mg via ORAL
  Filled 2023-04-22: qty 1

## 2023-04-22 MED ORDER — ASPIRIN 81 MG PO TBEC
81.0000 mg | DELAYED_RELEASE_TABLET | Freq: Every day | ORAL | Status: DC
  Administered 2023-04-23 – 2023-04-24 (×2): 81 mg via ORAL
  Filled 2023-04-22 (×2): qty 1

## 2023-04-22 MED ORDER — AMLODIPINE BESYLATE 5 MG PO TABS
10.0000 mg | ORAL_TABLET | Freq: Every day | ORAL | Status: DC
  Administered 2023-04-23 – 2023-04-24 (×2): 10 mg via ORAL
  Filled 2023-04-22 (×2): qty 2

## 2023-04-22 MED ORDER — WARFARIN - PHARMACIST DOSING INPATIENT: Freq: Every day | Status: DC

## 2023-04-22 MED ORDER — STROKE: EARLY STAGES OF RECOVERY BOOK
Freq: Once | Status: AC
  Filled 2023-04-22: qty 1

## 2023-04-22 NOTE — Progress Notes (Signed)
ANTICOAGULATION CONSULT NOTE - Initial Consult  Pharmacy Consult for Warfarin  Indication:  Lupus anti-coagulant  Allergies  Allergen Reactions   Tape Other (See Comments)    CERTAIN MEDICAL TAPES HURT AND BURN THE SKIN    Patient Measurements: Height: 4\' 10"  (147.3 cm) Weight: 61.2 kg (134 lb 14.7 oz) IBW/kg (Calculated) : 40.9 Vital Signs: Temp: 97.3 F (36.3 C) (06/28 2212) Temp Source: Oral (06/28 2212) BP: 130/82 (06/28 2212) Pulse Rate: 108 (06/28 2212)  Labs: Recent Labs    04/22/23 1651 04/22/23 1710  HGB 13.6 14.6  HCT 42.8 43.0  PLT 187  --   APTT 64*  --   LABPROT 23.1*  --   INR 2.0*  --   CREATININE 0.81 0.70    Estimated Creatinine Clearance: 47.7 mL/min (by C-G formula based on SCr of 0.7 mg/dL).   Medical History: Past Medical History:  Diagnosis Date   Arthritis    Cancer (HCC)    skin   Diabetes mellitus without complication (HCC)    Hypertension    Immune deficiency disorder (HCC)    Lupus (systemic lupus erythematosus) (HCC)    Renal disorder    Stage 2 KD   Stroke [facility])    2009    Assessment: 72 y/o F on warfarin PTA for lupus anti-coagulant, presented to the ED with facial droop and weakness, found to have left common carotid luminal thrombus, INR this evening is 2, neurology recommends to continue warfarin for now, only start heparin if INR becomes sub-therapeutic.   Goal of Therapy:  INR 2-3 Monitor platelets by anticoagulation protocol: Yes   Plan:  Warfarin 5 mg PO x 1 now Daily PT/INR Monitor for bleeding Consider heparin if INR falls below 2  Abran Duke, PharmD, BCPS Clinical Pharmacist Phone: [phone]

## 2023-04-22 NOTE — Code Documentation (Signed)
Stroke Response Nurse Documentation Code Documentation  Rhonda Contreras is a 72 y.o. female arriving to [facility]  via  EMS  on 04/22/23 with past medical hx of CVA, hypertension, diabetes, lupus on warfarin, CKD. On warfarin daily. Code stroke was activated by ED.   Patient from home where she was LKW at 0100 when she went to bed and now complaining of slurred speech, facial droop. Patient called her son on the phone and he noticed her speech was altered and called EMS.    Stroke team at the bedside on patient arrival. Labs drawn and patient cleared for CT by Dr. Rush Landmark. Patient to CT with team. NIHSS 6, see documentation for details and code stroke times. Patient with disoriented, left gaze preference , left facial droop, and dysarthria  on exam. The following imaging was completed:  CT Head, CTA, and CTP. Patient is not a candidate for IV Thrombolytic due to outside the window. Patient is not a candidate for IR due to no LVO per MD.   Care Plan: Q2  neuro checks, MRI, permissive HTN <220/120.   Bedside handoff with ED RN.    Modena Slater  Stroke Response RN [phone]

## 2023-04-22 NOTE — H&P (Signed)
History and Physical    HAYDAN STYS ZOX:[id number] DOB: 02/20/1951 DOA: 04/22/2023  PCP: Georgann Housekeeper, MD   Chief Complaint: facial droop  HPI: Rhonda Contreras is a 72 y.o. female with medical history significant of type 2 diabetes, hypertension, lupus, prior stroke who presents emerged due to dysarthria, right-sided facial droop and right-sided weakness.  Patient was speaking on the phone with family member who was concerned about she was standing so went to go see her and she was altered and with notable deficits.  EMS was called and she was brought to the ER for further assessment.  She was noted to have a left gaze presents and a right facial droop.  Code stroke was activated.  Patient underwent CT head which showed no acute intracranial process.  CTA head and neck demonstrated peripheral filling defect of the distal left common carotid concerning for luminal thrombosis.  MRI brain showed acute left MCA infarct involving frontal operculum and superior insular.  Labs showed INR 2.0, hemoglobin 13.6, CMP unrevealing.  Patient was admitted for further workup.  Neurology was consulted to assist with further management of acute stroke.  On admission she was resting comfortably.  She denied complaints although was notably dysarthric with facial droop.  Review of Systems: Review of Systems  All other systems reviewed and are negative.    As per HPI otherwise 10 point review of systems negative.   Allergies  Allergen Reactions   Tape Other (See Comments)    CERTAIN MEDICAL TAPES HURT AND BURN THE SKIN    Past Medical History:  Diagnosis Date   Arthritis    Cancer (HCC)    skin   Diabetes mellitus without complication (HCC)    Hypertension    Immune deficiency disorder (HCC)    Lupus (systemic lupus erythematosus) (HCC)    Renal disorder    Stage 2 KD   Stroke [facility])    2009    Past Surgical History:  Procedure Laterality Date   CATARACT EXTRACTION Bilateral    FEMUR IM NAIL  Left 02/02/2018   Procedure: LEFT FEMUR INTRAMEDULLARY (IM) NAIL;  Surgeon: Sheral Apley, MD;  Location: [facility] OR;  Service: Orthopedics;  Laterality: Left;   ORIF ELBOW FRACTURE Right 03/16/2023   Procedure: OPEN REDUCTION INTERNAL FIXATION (ORIF) ELBOW/OLECRANON FRACTURE;  Surgeon: [name], MD;  Location: [facility] ORS;  Service: Orthopedics;  Laterality: Right;   TONSILLECTOMY       reports that she has never smoked. She has never used smokeless tobacco. She reports that she does not currently use alcohol. She reports that she does not use drugs.  Family History  Problem Relation Age of Onset   Heart attack Mother     Prior to Admission medications   Medication Sig Start Date End Date Taking? Authorizing Provider  alendronate (FOSAMAX) 70 MG tablet Take 70 mg by mouth every Saturday.  12/27/17   [provider]  amLODipine (NORVASC) 10 MG tablet Take 10 mg by mouth daily. 12/29/17   [provider]  atorvastatin (LIPITOR) 20 MG tablet Take 20 mg by mouth daily. 12/29/17   [provider]  docusate sodium (COLACE) 100 MG capsule Take 1 capsule (100 mg total) by mouth 2 (two) times daily. To prevent constipation while taking pain medication. Patient not taking: Reported on 05/04/2020 02/02/18   Albina Billet III, PA-C  glimepiride (AMARYL) 1 MG tablet Take 1 mg by mouth daily. 12/29/17   [provider]  lisinopril-hydrochlorothiazide (ZESTORETIC)  10-12.5 MG tablet Take 2 tablets by mouth daily.    [provider]  loratadine (CLARITIN) 10 MG tablet Take 10 mg by mouth in the morning.    [provider]  metFORMIN (GLUCOPHAGE) 1000 MG tablet Take 1,000 mg by mouth at bedtime.    [provider]  metoprolol tartrate (LOPRESSOR) 100 MG tablet Take 100 mg by mouth 2 (two) times daily.    [provider]  metoprolol tartrate (LOPRESSOR) 50 MG tablet Take 1 tablet (50 mg total) by mouth 2 (two) times daily. Patient  not taking: Reported on 03/11/2023 05/10/20   Hartley Barefoot A, MD  phosphorus (K PHOS NEUTRAL) 155-852-130 MG tablet Take 2 tablets (500 mg total) by mouth 2 (two) times daily. Patient not taking: Reported on 03/11/2023 05/10/20   Regalado, Jon Billings A, MD  polyethylene glycol (MIRALAX / GLYCOLAX) packet Take 17 g by mouth daily. Patient taking differently: Take 17 g by mouth daily as needed for mild constipation. 02/07/18   Darlin Drop, DO  warfarin (COUMADIN) 5 MG tablet Take 0.5 tablets (2.5 mg total) by mouth daily. Patient taking differently: Take 2.5-5 mg by mouth See admin instructions. Take 5 mg by mouth on Sunday, Wednesday and Friday, take 2.5 mg all other days 05/10/20 03/16/23  Alba Cory, MD    Physical Exam: Vitals:   04/22/23 1753 04/22/23 2029 04/22/23 2030 04/22/23 2212  BP:  (!) 165/68 (!) 165/68 130/82  Pulse:  70 (!) 52 (!) 108  Resp:  (!) 27 (!) 23 20  Temp:  98.1 F (36.7 C)  (!) 97.3 F (36.3 C)  TempSrc:  Oral  Oral  SpO2:  95% 95% 96%  Weight: 61.2 kg     Height: 4\' 10"  (1.473 m)      Physical Exam HENT:     Head: Normocephalic.     Nose: Nose normal.     Mouth/Throat:     Mouth: Mucous membranes are moist.     Pharynx: Oropharynx is clear.  Eyes:     Pupils: Pupils are equal, round, and reactive to light.  Cardiovascular:     Rate and Rhythm: Normal rate and regular rhythm.     Pulses: Normal pulses.  Pulmonary:     Effort: Pulmonary effort is normal.  Abdominal:     General: Abdomen is flat. Bowel sounds are normal.  Musculoskeletal:     Cervical back: Normal range of motion.  Skin:    General: Skin is warm.     Capillary Refill: Capillary refill takes less than 2 seconds.  Neurological:     Mental Status: She is alert.     Cranial Nerves: Cranial nerve deficit present.     Sensory: Sensory deficit present.     Motor: Weakness present.  Psychiatric:        Mood and Affect: Mood normal.        Labs on Admission: I have personally  reviewed the patients's labs and imaging studies.  Assessment/Plan Principal Problem:   CVA (cerebral vascular accident) (HCC)   Acute CVA of left MCA distribution, POA, active - Facial droop with gaze preference - MRI with acute stroke - Neurology consulted  Plan: Obtain echocardiogram PT/OT/speech evaluation CT imaging with luminal thrombus.  Etiology of stroke likely due to this however warfarin is therapeutic.  History of lupus on warfarin-will continue INR goal of 2-3.  Continue home warfarin  CKD stage IIIa-trend kidney function  Hyperlipidemia-continue Lipitor  Hypertension-continue home amlodipine, metoprolol.  Will hold lisinopril and hydrochlorothiazide for permissive hypertension    Admission status: Observation Telemetry Medical  Certification: The appropriate patient status for this patient is OBSERVATION. Observation status is judged to be reasonable and necessary in order to provide the required intensity of service to ensure the patient's safety. The patient's presenting symptoms, physical exam findings, and initial radiographic and laboratory data in the context of their medical condition is felt to place them at decreased risk for further clinical deterioration. Furthermore, it is anticipated that the patient will be medically stable for discharge from the hospital within 2 midnights of admission.     Alan Mulder MD [facility] If 7PM-7AM, please contact night-coverage [email or web address]  04/22/2023, 11:27 PM

## 2023-04-22 NOTE — ED Provider Notes (Signed)
[place] EMERGENCY DEPARTMENT AT [facility] Provider Note   CSN: [id number] Arrival date & time: 04/22/23  1643     History {Add pertinent medical, surgical, social history, OB history to HPI:1} Chief Complaint  Patient presents with   Facial Droop    Rhonda Contreras is a 72 y.o. female.  The history is provided by the patient, medical records and the EMS personnel. No language interpreter was used.  Neurologic Problem This is a new problem. The current episode started 12 to 24 hours ago. The problem occurs constantly. The problem has not changed since onset.Pertinent negatives include no chest pain, no abdominal pain, no headaches and no shortness of breath. Nothing aggravates the symptoms. Nothing relieves the symptoms. She has tried nothing for the symptoms. The treatment provided no relief.       Home Medications Prior to Admission medications   Medication Sig Start Date End Date Taking? Authorizing Provider  alendronate (FOSAMAX) 70 MG tablet Take 70 mg by mouth every Saturday.  12/27/17   [provider]  amLODipine (NORVASC) 10 MG tablet Take 10 mg by mouth daily. 12/29/17   [provider]  atorvastatin (LIPITOR) 20 MG tablet Take 20 mg by mouth daily. 12/29/17   [provider]  docusate sodium (COLACE) 100 MG capsule Take 1 capsule (100 mg total) by mouth 2 (two) times daily. To prevent constipation while taking pain medication. Patient not taking: Reported on 05/04/2020 02/02/18   Albina Billet III, PA-C  glimepiride (AMARYL) 1 MG tablet Take 1 mg by mouth daily. 12/29/17   [provider]  lisinopril-hydrochlorothiazide (ZESTORETIC) 10-12.5 MG tablet Take 2 tablets by mouth daily.    [provider]  loratadine (CLARITIN) 10 MG tablet Take 10 mg by mouth in the morning.    [provider]  metFORMIN (GLUCOPHAGE) 1000 MG tablet Take 1,000 mg by mouth at bedtime.    [provider]  metoprolol  tartrate (LOPRESSOR) 100 MG tablet Take 100 mg by mouth 2 (two) times daily.    [provider]  metoprolol tartrate (LOPRESSOR) 50 MG tablet Take 1 tablet (50 mg total) by mouth 2 (two) times daily. Patient not taking: Reported on 03/11/2023 05/10/20   Hartley Barefoot A, MD  phosphorus (K PHOS NEUTRAL) 155-852-130 MG tablet Take 2 tablets (500 mg total) by mouth 2 (two) times daily. Patient not taking: Reported on 03/11/2023 05/10/20   Regalado, Jon Billings A, MD  polyethylene glycol (MIRALAX / GLYCOLAX) packet Take 17 g by mouth daily. Patient taking differently: Take 17 g by mouth daily as needed for mild constipation. 02/07/18   Darlin Drop, DO  warfarin (COUMADIN) 5 MG tablet Take 0.5 tablets (2.5 mg total) by mouth daily. Patient taking differently: Take 2.5-5 mg by mouth See admin instructions. Take 5 mg by mouth on Sunday, Wednesday and Friday, take 2.5 mg all other days 05/10/20 03/16/23  Alba Cory, MD      Allergies    Tape    Review of Systems   Review of Systems  Constitutional:  Negative for chills, fatigue and fever.  HENT:  Negative for congestion.   Eyes:  Negative for visual disturbance.  Respiratory:  Negative for cough, chest tightness and shortness of breath.   Cardiovascular:  Negative for chest pain.  Gastrointestinal:  Negative for abdominal pain, nausea and vomiting.  Musculoskeletal:  Negative for back pain.  Neurological:  Positive for facial asymmetry and speech difficulty. Negative for seizures, weakness, numbness  and headaches.  Psychiatric/Behavioral:  Negative for agitation.   All other systems reviewed and are negative.   Physical Exam Updated Vital Signs There were no vitals taken for this visit. Physical Exam Vitals and nursing note reviewed.  Constitutional:      General: She is not in acute distress.    Appearance: She is well-developed. She is not ill-appearing, toxic-appearing or diaphoretic.  HENT:     Head: Normocephalic and  atraumatic.     Nose: Nose normal.     Mouth/Throat:     Mouth: Mucous membranes are moist.  Eyes:     Conjunctiva/sclera: Conjunctivae normal.  Cardiovascular:     Rate and Rhythm: Normal rate and regular rhythm.     Heart sounds: No murmur heard. Pulmonary:     Effort: Pulmonary effort is normal. No respiratory distress.     Breath sounds: Normal breath sounds.  Abdominal:     Palpations: Abdomen is soft.     Tenderness: There is no abdominal tenderness.  Musculoskeletal:        General: No swelling.     Cervical back: Neck supple. No tenderness.  Skin:    General: Skin is warm and dry.     Capillary Refill: Capillary refill takes less than 2 seconds.     Findings: No erythema.  Neurological:     Mental Status: She is alert.     Cranial Nerves: Facial asymmetry present.     Sensory: No sensory deficit.     Motor: Weakness present. No abnormal muscle tone or seizure activity.     Comments: Right lower facial droop that spares the forehead.  Normal extraocular movements.  Some mild left gaze preference.  Some aphasia as patient is having a difficult time saying everything she wants to say.  Psychiatric:        Mood and Affect: Mood normal.     ED Results / Procedures / Treatments   Labs (all labs ordered are listed, but only abnormal results are displayed) Labs Reviewed  PROTIME-INR - Abnormal; Notable for the following components:      Result Value   Prothrombin Time 23.1 (*)    INR 2.0 (*)    All other components within normal limits  APTT - Abnormal; Notable for the following components:   aPTT 64 (*)    All other components within normal limits  COMPREHENSIVE METABOLIC PANEL - Abnormal; Notable for the following components:   CO2 20 (*)    Glucose, Bld 176 (*)    Total Bilirubin 0.2 (*)    All other components within normal limits  I-STAT CHEM 8, ED - Abnormal; Notable for the following components:   Glucose, Bld 179 (*)    Calcium, Ion 1.14 (*)    All other  components within normal limits  CBG MONITORING, ED - Abnormal; Notable for the following components:   Glucose-Capillary 180 (*)    All other components within normal limits  ETHANOL  CBC  DIFFERENTIAL  RAPID URINE DRUG SCREEN, HOSP PERFORMED  URINALYSIS, ROUTINE W REFLEX MICROSCOPIC  PROTIME-INR    EKG None  Radiology CT ANGIO HEAD NECK W WO CM W PERF (CODE STROKE)  Result Date: 04/22/2023 CLINICAL DATA:  Stroke suspected EXAM: CT ANGIOGRAPHY HEAD AND NECK TECHNIQUE: Multidetector CT imaging of the head and neck was performed using the standard protocol during bolus administration of intravenous contrast. Multiplanar CT image reconstructions and MIPs were obtained to evaluate the vascular anatomy. Carotid stenosis measurements (when applicable) are  obtained utilizing NASCET criteria, using the distal internal carotid diameter as the denominator. RADIATION DOSE REDUCTION: This exam was performed according to the departmental dose-optimization program which includes automated exposure control, adjustment of the mA and/or kV according to patient size and/or use of iterative reconstruction technique. CONTRAST:  OMNIPAQUE IOHEXOL 350 MG/ML SOLN COMPARISON:  No prior CTA available, correlation is made with CT head 04/22/2023 and MRA head 05/16/2008 FINDINGS: CT HEAD FINDINGS For noncontrast findings, please see same day CT head. 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 The left vertebral artery also originates from the aorta. Mild aortic atherosclerosis. No evidence of aortic dissection or aneurysm. Right carotid system: No evidence of dissection, occlusion, or hemodynamically significant stenosis (greater than 50%). Left carotid system: Peripheral filling defect in the distal left common carotid (series 5, image 232),  concerning for luminal thrombus, which does not extend past the bifurcation. No evidence of hemodynamically significant stenosis, dissection or occlusion. Vertebral arteries: No evidence of dissection, occlusion, or hemodynamically significant stenosis (greater than 50%). Skeleton: No acute osseous abnormality. Severe degenerative changes in the cervical spine. Other neck: Multinodular goiter, with the largest discrete nodule measuring up to 2.0 cm (series 5, image 140). Upper chest: No focal pulmonary opacity or pleural effusion. Review of the MIP images confirms the above findings CTA HEAD FINDINGS Anterior circulation: Both internal carotid arteries are patent to the termini, with mild stenosis in the right cavernous and left supraclinoid ICA A1 segments patent. Normal anterior communicating artery. Anterior cerebral arteries are patent to their distal aspects without significant stenosis. No M1 stenosis or occlusion. MCA branches perfused to their distal aspects without significant stenosis. Posterior circulation: Vertebral arteries patent to the vertebrobasilar junction without significant stenosis. Posterior inferior cerebellar arteries patent proximally. Basilar patent to its distal aspect without significant stenosis. Superior cerebellar arteries patent proximally. Patent P1 segments, hypoplastic on the right. Near fetal origin of the right PCA from the right posterior communicating artery. PCAs perfused to their distal aspects without significant stenosis. The left posterior communicating artery is not visualized. Venous sinuses: As permitted by contrast timing, patent. Anatomic variants: None significant. Review of the MIP images confirms the above findings CT Brain Perfusion Findings: ASPECTS: 10 CBF (<30%) Volume: 0mL Perfusion (Tmax>6.0s) volume: 29mL Mismatch Volume: 29mL Infarction Location:No infarct core. Areas of decreased perfusion are noted in multiple vascular territories, favored to be  artifactual. IMPRESSION: 1. Peripheral filling defect in the distal left common carotid, concerning for luminal thrombus. 2. No intracranial large vessel occlusion. Mild stenosis in the right cavernous and left supraclinoid ICA. 3. No hemodynamically significant stenosis in the neck. 4. No infarct core. Areas of decreased perfusion are noted in multiple vascular territories, favored to be artifactual, possibly related to motion. 5. Multinodular goiter, with the largest discrete nodule measuring up to 2.0 cm. If this has not previously been evaluated, a non-emergent ultrasound of the thyroid is recommended. (Reference: J Am Coll Radiol. 2015 Feb;12(2): 143-50) 6. Incidental note is made of variant aortic anatomy, with an aortic ring composed of an aberrant origin of the right subclavian, which joins with the normal origin of the right subclavian. The left vertebral artery also originates from the aorta. Imaging results were communicated on 04/22/2023 at 5:36 pm to provider Dr. Amada Jupiter via secure text paging. Electronically Signed   By: Jill Side  Vasan M.D.   On: 04/22/2023 17:40   CT HEAD CODE STROKE WO CONTRAST  Result Date: 04/22/2023 CLINICAL DATA:  Code stroke.  Stroke suspected EXAM: CT HEAD WITHOUT CONTRAST TECHNIQUE: Contiguous axial images were obtained from the base of the skull through the vertex without intravenous contrast. RADIATION DOSE REDUCTION: This exam was performed according to the departmental dose-optimization program which includes automated exposure control, adjustment of the mA and/or kV according to patient size and/or use of iterative reconstruction technique. COMPARISON:  05/04/2020 FINDINGS: Brain: No evidence of acute infarction, hemorrhage, mass, mass effect, or midline shift. No hydrocephalus or extra-axial collection. Periventricular white matter changes, likely the sequela of chronic small vessel ischemic disease. Remote lacunar infarcts in the basal ganglia, thalami, and  cerebellum. Vascular: No definite hyperdense vessel. There is increased density at the left ICA terminus and in the distal basilar artery, which appears unchanged from the prior exam. Atherosclerotic calcifications in the intracranial carotid and vertebral arteries. Skull: Negative for fracture or focal lesion. Sinuses/Orbits: No acute finding. Status post bilateral lens replacements. Other: None. ASPECTS [facility] Stroke Program Early CT Score) - Ganglionic level infarction (caudate, lentiform nuclei, internal capsule, insula, M1-M3 cortex): 7 - Supraganglionic infarction (M4-M6 cortex): 3 Total score (0-10 with 10 being normal): 10 IMPRESSION: 1. No acute intracranial process. 2. ASPECTS is 10. Imaging results were communicated on 04/22/2023 at 5:13 pm to provider Dr. Amada Jupiter via secure text paging. Electronically Signed   By: Wiliam Ke M.D.   On: 04/22/2023 17:13    Procedures Procedures  {Document cardiac monitor, telemetry assessment procedure when appropriate:1}  CRITICAL CARE Performed by: Canary Brim Evelyn Moch Total critical care time: 35 minutes Critical care time was exclusive of separately billable procedures and treating other patients. Critical care was necessary to treat or prevent imminent or life-threatening deterioration. Critical care was time spent personally by me on the following activities: development of treatment plan with patient and/or surrogate as well as nursing, discussions with consultants, evaluation of patient's response to treatment, examination of patient, obtaining history from patient or surrogate, ordering and performing treatments and interventions, ordering and review of laboratory studies, ordering and review of radiographic studies, pulse oximetry and re-evaluation of patient's condition.  Medications Ordered in ED Medications  iohexol (OMNIPAQUE) 350 MG/ML injection 100 mL (100 mLs Intravenous Contrast Given 04/22/23 1707)    ED Course/ Medical  Decision Making/ A&P   {   Click here for ABCD2, HEART and other calculatorsREFRESH Note before signing :1}                          Medical Decision Making Amount and/or Complexity of Data Reviewed Labs: ordered. Radiology: ordered.  Risk Decision regarding hospitalization.    JOSELIN SCHOENDORF is a 72 y.o. female with a past medical history significant for diabetes, hypertension, lupus, and previous stroke who presents for neurologic deficit.  According to EMS and patient, she was last normal at 1 AM when she went to bed.  She reports this morning and thought she was feeling fine but does not know when symptoms began but she noticed a right lower facial droop that is new and difficulty getting out what she wants to say with mumbling and slurred speech.  She has never had this before.  She denies new numbness or weakness in her extremities.  She spoke to family by phone at 4 PM yesterday and was speaking clearly and people not seen her in several days.  On my initial exam, patient does have right lower facial droop that is sparing the forehead.  Normal extraocular events.  Intact sensation throughout and grip strength seems similar.  She did have some stuttering and what she said was a difficult time getting out speech.  She was slightly aphasic.  Otherwise intact strength in legs.  EMS reports her glucose was not critically abnormal.  Given her unilateral facial droop and speech difficulty and symptoms beginning within the last 24 hours, will activate as a suspected LVO stroke after discussing with Dr. Onalee Hua.  Will await further workup results to determine disposition.  Per neurology, will admit to medicine for further management of suspected acute stroke.  {Document critical care time when appropriate:1} {Document review of labs and clinical decision tools ie heart score, Chads2Vasc2 etc:1}  {Document your independent review of radiology images, and any outside  records:1} {Document your discussion with family members, caretakers, and with consultants:1} {Document social determinants of health affecting pt's care:1} {Document your decision making why or why not admission, treatments were needed:1} Final Clinical Impression(s) / ED Diagnoses Final diagnoses:  None    Rx / DC Orders ED Discharge Orders     None

## 2023-04-22 NOTE — Consult Note (Signed)
Neurology Consultation  Reason for Consult: Code stroke  Referring Physician: Dr. Rush Landmark  CC: facial droop and right side weakness  History is obtained from:patient and medical   HPI: Rhonda Contreras is a 72 y.o. female with past medical history of hypertension, diabetes, lupus on warfarin, prior CVA, CKD who presents to [facility], ED via EMS for evaluation of right facial droop and right-sided weakness. Code stroke not activated by EMS because they were unable to pinpoint a LKW.  Per patient LKW 0100 when she went to bed. Code stroke was activated by the EDP.  Patient last known well was 0100 when she went to bed she woke this morning around 900 and did not realize any deficits. She was on the phone with her nephew who noticed her speech was not right and then came over and noticed a right facial droop. They called EMS.  In the Ed she is noted to have a left gaze preference and right facial droop and code stroke was activated. She states her speech is funny but otherwise denies any other symptoms of weakness, numbness, tingling or vision changes  Code stroke CT head with no acute process, Aspects 10. CTA head and neck with no LVO, Peripheral filling defect in the distal left common carotid, concerning for luminal thrombus.   LKW: 0100 IV thrombolysis given?: no, outside window  EVT:  No LVO Premorbid modified Rankin scale (mRS):  0-Completely asymptomatic and back to baseline post-stroke  ROS: Full ROS was performed and is negative except as noted in the HPI.    Past Medical History:  Diagnosis Date   Arthritis    Cancer (HCC)    skin   Diabetes mellitus without complication (HCC)    Hypertension    Immune deficiency disorder (HCC)    Lupus (systemic lupus erythematosus) (HCC)    Renal disorder    Stage 2 KD   Stroke [facility])    2009     Family History  Problem Relation Age of Onset   Heart attack Mother      Social History:   reports that she has never smoked. She has  never used smokeless tobacco. She reports that she does not currently use alcohol. She reports that she does not use drugs.  Medications No current facility-administered medications for this encounter.  Current Outpatient Medications:    alendronate (FOSAMAX) 70 MG tablet, Take 70 mg by mouth every Saturday. , Disp: , Rfl: 12   amLODipine (NORVASC) 10 MG tablet, Take 10 mg by mouth daily., Disp: , Rfl: 3   atorvastatin (LIPITOR) 20 MG tablet, Take 20 mg by mouth daily., Disp: , Rfl: 4   docusate sodium (COLACE) 100 MG capsule, Take 1 capsule (100 mg total) by mouth 2 (two) times daily. To prevent constipation while taking pain medication. (Patient not taking: Reported on 05/04/2020), Disp: 60 capsule, Rfl: 0   glimepiride (AMARYL) 1 MG tablet, Take 1 mg by mouth daily., Disp: , Rfl: 4   lisinopril-hydrochlorothiazide (ZESTORETIC) 10-12.5 MG tablet, Take 2 tablets by mouth daily., Disp: , Rfl:    loratadine (CLARITIN) 10 MG tablet, Take 10 mg by mouth in the morning., Disp: , Rfl:    metFORMIN (GLUCOPHAGE) 1000 MG tablet, Take 1,000 mg by mouth at bedtime., Disp: , Rfl:    metoprolol tartrate (LOPRESSOR) 100 MG tablet, Take 100 mg by mouth 2 (two) times daily., Disp: , Rfl:    metoprolol tartrate (LOPRESSOR) 50 MG tablet, Take 1 tablet (50 mg total)  by mouth 2 (two) times daily. (Patient not taking: Reported on 03/11/2023), Disp: 30 tablet, Rfl: 0   phosphorus (K PHOS NEUTRAL) 155-852-130 MG tablet, Take 2 tablets (500 mg total) by mouth 2 (two) times daily. (Patient not taking: Reported on 03/11/2023), Disp: 6 tablet, Rfl: 0   polyethylene glycol (MIRALAX / GLYCOLAX) packet, Take 17 g by mouth daily. (Patient taking differently: Take 17 g by mouth daily as needed for mild constipation.), Disp: 14 each, Rfl: 0   warfarin (COUMADIN) 5 MG tablet, Take 0.5 tablets (2.5 mg total) by mouth daily. (Patient taking differently: Take 2.5-5 mg by mouth See admin instructions. Take 5 mg by mouth on Sunday,  Wednesday and Friday, take 2.5 mg all other days), Disp: 15 tablet, Rfl: 0   Exam: Current vital signs: BP (!) 151/77   Resp 17   SpO2 94%  Vital signs in last 24 hours: Resp:  [17] 17 (06/28 1652) BP: (151)/(77) 151/77 (06/28 1652) SpO2:  [94 %] 94 % (06/28 1648)  GENERAL: Awake, alert in NAD HEENT: - Normocephalic and atraumatic, dry mm LUNGS - Clear to auscultation bilaterally with no wheezes CV - S1S2 RRR, no m/r/g, equal pulses bilaterally. ABDOMEN - Soft, nontender, nondistended with normoactive BS Ext: warm, well perfused, intact peripheral pulses, no edema  NEURO:  Mental Status: AA, oriented to self, stated month as "July" and age as "77" Language: speech is dysarthric.  Naming, repetition, fluency, and comprehension intact. Cranial Nerves: PERRL left gaze preference, can cross midline, visual fields full, right facial asymmetry, facial sensation intact, hearing intact, tongue/uvula/soft palate midline, normal sternocleidomastoid and trapezius muscle strength. No evidence of tongue atrophy or fibrillations Motor: 5/5 in all 4 extremities  Tone: is normal and bulk is normal Sensation- Intact to light touch bilaterally Coordination: FTN intact bilaterally, no ataxia in BLE. Gait- deferred  NIHSS 1a Level of Conscious.: 0 1b LOC Questions: 2 1c LOC Commands: 0 2 Best Gaze: 1 3 Visual: 0 4 Facial Palsy: 2 5a Motor Arm - left: 0 5b Motor Arm - Right: 0 6a Motor Leg - Left: 0 6b Motor Leg - Right: 0 7 Limb Ataxia: 0 8 Sensory: 0 9 Best Language: 0 10 Dysarthria: 1 11 Extinct. and Inatten.: 0 TOTAL: 6   Labs I have reviewed labs in epic and the results pertinent to this consultation are:  CBC    Component Value Date/Time   WBC 8.3 03/16/2023 0954   RBC 4.11 03/16/2023 0954   HGB 12.1 03/16/2023 0954   HCT 38.2 03/16/2023 0954   PLT 207 03/16/2023 0954   MCV 92.9 03/16/2023 0954   MCH 29.4 03/16/2023 0954   MCHC 31.7 03/16/2023 0954   RDW 13.7  03/16/2023 0954   LYMPHSABS 2.2 05/05/2020 0742   MONOABS 1.1 (H) 05/05/2020 0742   EOSABS 0.1 05/05/2020 0742   BASOSABS 0.1 05/05/2020 0742    CMP     Component Value Date/Time   NA 137 03/16/2023 0954   K 3.5 03/16/2023 0954   CL 103 03/16/2023 0954   CO2 22 03/16/2023 0954   GLUCOSE 154 (H) 03/16/2023 0954   BUN 24 (H) 03/16/2023 0954   CREATININE 1.20 (H) 03/16/2023 0954   CALCIUM 9.1 03/16/2023 0954   CALCIUM 11.2 (H) 05/04/2020 2217   PROT 6.2 (L) 05/05/2020 0742   ALBUMIN 3.0 (L) 05/06/2020 0623   AST 17 05/05/2020 0742   ALT 15 05/05/2020 0742   ALKPHOS 50 05/05/2020 0742   BILITOT 1.0 05/05/2020 0742  GFRNONAA 48 (L) 03/16/2023 0954   GFRAA >60 05/10/2020 0704    Lipid Panel     Component Value Date/Time   CHOL (H) 05/16/2008 0340    228        ATP III CLASSIFICATION:  <200     mg/dL   Desirable  562-130  mg/dL   Borderline High  >=865    mg/dL   High   TRIG 784 (H) 69/62/9528 0340   HDL 31 (L) 05/16/2008 0340   CHOLHDL 7.4 05/16/2008 0340   VLDL 44 (H) 05/16/2008 0340   LDLCALC (H) 05/16/2008 0340    153        Total Cholesterol/HDL:CHD Risk Coronary Heart Disease Risk Table                     Men   Women  1/2 Average Risk   3.4   3.3    Lab Results  Component Value Date   HGBA1C (H) 05/15/2008    9.0 (NOTE)   The ADA recommends the following therapeutic goal for glycemic   control related to Hgb A1C measurement:   Goal of Therapy:   < 7.0% Hgb A1C   Reference: American Diabetes Association: Clinical Practice   Recommendations 2008, Diabetes Care,  2008, 31:(Suppl 1).      Imaging I have reviewed the images obtained:  CT-head with no acute process   CTA head and neck with no LVO. Peripheral filling defect in the distal left common carotid, concerning for luminal thrombus.  CT perf no core infarct   Assessment:  72 y.o. female with past medical history of hypertension, diabetes, lupus anticoagulant on warfarin, prior CVA, CKD who  presents to [facility], ED via EMS for evaluation of right facial droop and right-sided weakness.   Recommendations: - HgbA1c, fasting lipid panel - MRI of the brain without contrast - Frequent neuro checks - Echocardiogram - CTA head and neck -Currently therapeutic on warfarin - Risk factor modification - Telemetry monitoring - PT consult, OT consult, Speech consult - Stroke team to follow  Gevena Mart DNP, ACNPC-AG  [facility]  I have seen the patient reviewed the above note.  She does not present with acute stroke, and appears to have acute thrombus but is currently therapeutic on Coumadin, so I would not start other anticoagulants, may consider low-dose heparin if she becomes subtherapeutic.  She has been subtherapeutic recently and I do wonder if this contributed to her current situation.  Stroke team to follow.  Ritta Slot, MD [facility] [phone]  If 7pm- 7am, please page neurology on call as listed in AMION.

## 2023-04-22 NOTE — ED Notes (Signed)
ED TO INPATIENT HANDOFF REPORT  ED Nurse Name and Phone #: Rodney Booze [phone]  S Name/Age/Gender Rhonda Contreras 72 y.o. female Room/Bed: 007C/007C  Code Status   Code Status: Prior  Home/SNF/Other Home Patient oriented to: self, place, time, and situation Is this baseline? Yes   Triage Complete: Triage complete  Chief Complaint CVA (cerebral vascular accident) [facility]) [I63.9]  Triage Note Patient BIB [facility] from home for stroke like symptoms. Patient reports she went to bed around 1 am feeling normal and woke up this morning and noticed slurred speech and facial droop. Family reports they last spoke to her yesterday at 4pm on the phone and last saw her on Tuesday. Symptoms discovered today at 36 when her nephew spoke to her on the phone. BP 161/82 HR 102 O2 94% RA, patient is on warfarin.    Allergies Allergies  Allergen Reactions   Tape Other (See Comments)    CERTAIN MEDICAL TAPES HURT AND BURN THE SKIN    Level of Care/Admitting Diagnosis ED Disposition     ED Disposition  Admit   Condition  --   Comment  Hospital Area: [facility] [[id number]]  Level of Care: Telemetry Medical [104]  May place patient in observation at [facility] or [facility] if equivalent level of care is available:: Yes  Covid Evaluation: Asymptomatic - no recent exposure (last 10 days) testing not required  Diagnosis: CVA (cerebral vascular accident) [facility]) [[id number]]  Admitting Physician: Alan Mulder [[id number]]  Attending Physician: Alan Mulder [[id number]]          B Medical/Surgery History Past Medical History:  Diagnosis Date   Arthritis    Cancer (HCC)    skin   Diabetes mellitus without complication (HCC)    Hypertension    Immune deficiency disorder (HCC)    Lupus (systemic lupus erythematosus) (HCC)    Renal disorder    Stage 2 KD   Stroke [facility])    2009   Past Surgical History:  Procedure Laterality Date   CATARACT EXTRACTION Bilateral    FEMUR IM  NAIL Left 02/02/2018   Procedure: LEFT FEMUR INTRAMEDULLARY (IM) NAIL;  Surgeon: Sheral Apley, MD;  Location: [facility] OR;  Service: Orthopedics;  Laterality: Left;   ORIF ELBOW FRACTURE Right 03/16/2023   Procedure: OPEN REDUCTION INTERNAL FIXATION (ORIF) ELBOW/OLECRANON FRACTURE;  Surgeon: [name], MD;  Location: [facility] ORS;  Service: Orthopedics;  Laterality: Right;   TONSILLECTOMY       A IV Location/Drains/Wounds Patient Lines/Drains/Airways Status     Active Line/Drains/Airways     Name Placement date Placement time Site Days   Peripheral IV 04/22/23 20 G Anterior;Left;Upper Arm 04/22/23  1658  Arm  less than 1   Peripheral IV 04/22/23 22 G Anterior;Right Forearm 04/22/23  1658  Forearm  less than 1            Intake/Output Last 24 hours No intake or output data in the 24 hours ending 04/22/23 2144  Labs/Imaging Results for orders placed or performed during the hospital encounter of 04/22/23 (from the past 48 hour(s))  Ethanol     Status: None   Collection Time: 04/22/23  4:51 PM  Result Value Ref Range   Alcohol, Ethyl (B) <10 <10 mg/dL    Comment: (NOTE) Lowest detectable limit for serum alcohol is 10 mg/dL.  For medical purposes only. Performed at [facility], [address] [place], [place] [address]   Protime-INR     Status: Abnormal  Collection Time: 04/22/23  4:51 PM  Result Value Ref Range   Prothrombin Time 23.1 (H) 11.4 - 15.2 seconds   INR 2.0 (H) 0.8 - 1.2    Comment: (NOTE) INR goal varies based on device and disease states. Performed at [facility], [address] [place], [place] [address]   APTT     Status: Abnormal   Collection Time: 04/22/23  4:51 PM  Result Value Ref Range   aPTT 64 (H) 24 - 36 seconds    Comment:        IF BASELINE aPTT IS ELEVATED, SUGGEST PATIENT RISK ASSESSMENT BE USED TO DETERMINE APPROPRIATE ANTICOAGULANT THERAPY. Performed at [facility], [address] [place], [place]  [address]   CBC     Status: None   Collection Time: 04/22/23  4:51 PM  Result Value Ref Range   WBC 9.5 4.0 - 10.5 K/uL   RBC 4.62 3.87 - 5.11 MIL/uL   Hemoglobin 13.6 12.0 - 15.0 g/dL   HCT 09.8 11.9 - 14.7 %   MCV 92.6 80.0 - 100.0 fL   MCH 29.4 26.0 - 34.0 pg   MCHC 31.8 30.0 - 36.0 g/dL   RDW 82.9 56.2 - 13.0 %   Platelets 187 150 - 400 K/uL   nRBC 0.0 0.0 - 0.2 %    Comment: Performed at [facility], [address], [place], [place] [address]  Differential     Status: None   Collection Time: 04/22/23  4:51 PM  Result Value Ref Range   Neutrophils Relative % 77 %   Neutro Abs 7.3 1.7 - 7.7 K/uL   Lymphocytes Relative 12 %   Lymphs Abs 1.2 0.7 - 4.0 K/uL   Monocytes Relative 8 %   Monocytes Absolute 0.7 0.1 - 1.0 K/uL   Eosinophils Relative 2 %   Eosinophils Absolute 0.2 0.0 - 0.5 K/uL   Basophils Relative 0 %   Basophils Absolute 0.0 0.0 - 0.1 K/uL   Immature Granulocytes 1 %   Abs Immature Granulocytes 0.06 0.00 - 0.07 K/uL    Comment: Performed at [facility], [address], [place], [place] [address]  Comprehensive metabolic panel     Status: Abnormal   Collection Time: 04/22/23  4:51 PM  Result Value Ref Range   Sodium 139 135 - 145 mmol/L   Potassium 4.0 3.5 - 5.1 mmol/L   Chloride 107 98 - 111 mmol/L   CO2 20 (L) 22 - 32 mmol/L   Glucose, Bld 176 (H) 70 - 99 mg/dL    Comment: Glucose reference range applies only to samples taken after fasting for at least 8 hours.   BUN 17 8 - 23 mg/dL   Creatinine, Ser 9.52 0.44 - 1.00 mg/dL   Calcium 9.7 8.9 - 84.1 mg/dL   Total Protein 7.6 6.5 - 8.1 g/dL   Albumin 3.8 3.5 - 5.0 g/dL   AST 22 15 - 41 U/L   ALT 24 0 - 44 U/L   Alkaline Phosphatase 88 38 - 126 U/L   Total Bilirubin 0.2 (L) 0.3 - 1.2 mg/dL   GFR, Estimated >32 >44 mL/min    Comment: (NOTE) Calculated using the CKD-EPI Creatinine Equation (2021)    Anion gap 12 5 - 15    Comment: Performed at [facility], [address],  [place], [place] [address]  CBG monitoring, ED     Status: Abnormal   Collection Time: 04/22/23  5:02 PM  Result Value Ref Range   Glucose-Capillary 180 (H) 70 - 99 mg/dL    Comment: Glucose reference range applies only to samples taken after fasting for at least 8 hours.  I-stat chem 8, ED     Status: Abnormal   Collection Time: 04/22/23  5:10 PM  Result Value Ref Range   Sodium 141 135 - 145 mmol/L   Potassium 3.9 3.5 - 5.1 mmol/L   Chloride 109 98 - 111 mmol/L   BUN 18 8 - 23 mg/dL   Creatinine, Ser 0.98 0.44 - 1.00 mg/dL   Glucose, Bld 119 (H) 70 - 99 mg/dL    Comment: Glucose reference range applies only to samples taken after fasting for at least 8 hours.   Calcium, Ion 1.14 (L) 1.15 - 1.40 mmol/L   TCO2 22 22 - 32 mmol/L   Hemoglobin 14.6 12.0 - 15.0 g/dL   HCT 14.7 82.9 - 56.2 %  Urinalysis, Routine w reflex microscopic -Urine, Clean Catch     Status: Abnormal   Collection Time: 04/22/23  8:55 PM  Result Value Ref Range   Color, Urine STRAW (A) YELLOW   APPearance CLEAR CLEAR   Specific Gravity, Urine 1.029 1.005 - 1.030   pH 5.0 5.0 - 8.0   Glucose, UA >=500 (A) NEGATIVE mg/dL   Hgb urine dipstick MODERATE (A) NEGATIVE   Bilirubin Urine NEGATIVE NEGATIVE   Ketones, ur NEGATIVE NEGATIVE mg/dL   Protein, ur 130 (A) NEGATIVE mg/dL   Nitrite NEGATIVE NEGATIVE   Leukocytes,Ua NEGATIVE NEGATIVE   RBC / HPF 21-50 0 - 5 RBC/hpf   WBC, UA 6-10 0 - 5 WBC/hpf   Bacteria, UA RARE (A) NONE SEEN   Squamous Epithelial / HPF 0-5 0 - 5 /HPF   Mucus PRESENT     Comment: Performed at [facility], [address] [place], [place] [address]   CT ANGIO HEAD NECK W WO CM W PERF (CODE STROKE)  Result Date: 04/22/2023 CLINICAL DATA:  Stroke suspected EXAM: CT ANGIOGRAPHY HEAD AND NECK TECHNIQUE: Multidetector CT imaging of the head and neck was performed using the standard protocol during bolus administration of intravenous contrast. Multiplanar CT image reconstructions and MIPs were  obtained to evaluate the vascular anatomy. Carotid stenosis measurements (when applicable) are obtained utilizing NASCET criteria, using the distal internal carotid diameter as the denominator. RADIATION DOSE REDUCTION: This exam was performed according to the departmental dose-optimization program which includes automated exposure control, adjustment of the mA and/or kV according to patient size and/or use of iterative reconstruction technique. CONTRAST:  OMNIPAQUE IOHEXOL 350 MG/ML SOLN COMPARISON:  No prior CTA available, correlation is made with CT head 04/22/2023 and MRA head 05/16/2008 FINDINGS: CT HEAD FINDINGS For noncontrast findings, please see same day CT head. 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 The left vertebral artery also originates from the aorta. Mild aortic atherosclerosis. No evidence of aortic dissection or aneurysm. Right carotid system: No evidence of dissection, occlusion, or hemodynamically significant stenosis (greater than 50%). Left carotid system: Peripheral filling defect in the distal left common carotid (series 5, image 232), concerning for luminal thrombus, which does not extend past the bifurcation. No evidence of hemodynamically significant stenosis, dissection or occlusion. Vertebral arteries: No evidence of dissection, occlusion, or hemodynamically significant stenosis (greater than 50%). Skeleton:  No acute osseous abnormality. Severe degenerative changes in the cervical spine. Other neck: Multinodular goiter, with the largest discrete nodule measuring up to 2.0 cm (series 5, image 140). Upper chest: No focal pulmonary opacity or pleural effusion. Review of the MIP images confirms the above findings CTA HEAD FINDINGS Anterior circulation: Both internal carotid arteries are patent to the termini,  with mild stenosis in the right cavernous and left supraclinoid ICA A1 segments patent. Normal anterior communicating artery. Anterior cerebral arteries are patent to their distal aspects without significant stenosis. No M1 stenosis or occlusion. MCA branches perfused to their distal aspects without significant stenosis. Posterior circulation: Vertebral arteries patent to the vertebrobasilar junction without significant stenosis. Posterior inferior cerebellar arteries patent proximally. Basilar patent to its distal aspect without significant stenosis. Superior cerebellar arteries patent proximally. Patent P1 segments, hypoplastic on the right. Near fetal origin of the right PCA from the right posterior communicating artery. PCAs perfused to their distal aspects without significant stenosis. The left posterior communicating artery is not visualized. Venous sinuses: As permitted by contrast timing, patent. Anatomic variants: None significant. Review of the MIP images confirms the above findings CT Brain Perfusion Findings: ASPECTS: 10 CBF (<30%) Volume: 0mL Perfusion (Tmax>6.0s) volume: 29mL Mismatch Volume: 29mL Infarction Location:No infarct core. Areas of decreased perfusion are noted in multiple vascular territories, favored to be artifactual. IMPRESSION: 1. Peripheral filling defect in the distal left common carotid, concerning for luminal thrombus. 2. No intracranial large vessel occlusion. Mild stenosis in the right cavernous and left supraclinoid ICA. 3. No hemodynamically significant stenosis in the neck. 4. No infarct core. Areas of decreased perfusion are noted in multiple vascular territories, favored to be artifactual, possibly related to motion. 5. Multinodular goiter, with the largest discrete nodule measuring up to 2.0 cm. If this has not previously been evaluated, a non-emergent ultrasound of the thyroid is recommended. (Reference: J Am Coll Radiol. 2015 Feb;12(2): 143-50) 6. 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 The left vertebral artery also originates from the aorta. Imaging results were communicated on 04/22/2023 at 5:36 pm to provider Dr. Amada Jupiter via secure text paging. Electronically Signed   By: Wiliam Ke M.D.   On: 04/22/2023 17:40   CT HEAD CODE STROKE WO CONTRAST  Result Date: 04/22/2023 CLINICAL DATA:  Code stroke.  Stroke suspected EXAM: CT HEAD WITHOUT CONTRAST TECHNIQUE: Contiguous axial images were obtained from the base of the skull through the vertex without intravenous contrast. RADIATION DOSE REDUCTION: This exam was performed according to the departmental dose-optimization program which includes automated exposure control, adjustment of the mA and/or kV according to patient size and/or use of iterative reconstruction technique. COMPARISON:  05/04/2020 FINDINGS: Brain: No evidence of acute infarction, hemorrhage, mass, mass effect, or midline shift. No hydrocephalus or extra-axial collection. Periventricular white matter changes, likely the sequela of chronic small vessel ischemic disease. Remote lacunar infarcts in the basal ganglia, thalami, and cerebellum. Vascular: No definite hyperdense vessel. There is increased density at the left ICA terminus and in the distal basilar artery, which appears unchanged from the prior exam. Atherosclerotic calcifications in the intracranial carotid and vertebral arteries. Skull: Negative for fracture or focal lesion. Sinuses/Orbits: No acute finding. Status post bilateral lens replacements. Other: None. ASPECTS [facility] Stroke Program Early CT Score) - Ganglionic level infarction (caudate, lentiform nuclei, internal capsule, insula, M1-M3 cortex): 7 - Supraganglionic  infarction (M4-M6 cortex): 3 Total score (0-10 with 10 being normal): 10 IMPRESSION: 1. No acute intracranial process. 2. ASPECTS is  10. Imaging results were communicated on 04/22/2023 at 5:13 pm to provider Dr. Amada Jupiter via secure text paging. Electronically Signed   By: Wiliam Ke M.D.   On: 04/22/2023 17:13    Pending Labs Unresulted Labs (From admission, onward)     Start     Ordered   04/23/23 0500  Protime-INR  Tomorrow morning,   R        04/22/23 1954   04/22/23 1651  Urine rapid drug screen (hosp performed)  Once,   STAT        04/22/23 1651            Vitals/Pain Today's Vitals   04/22/23 1652 04/22/23 1753 04/22/23 2029 04/22/23 2030  BP: (!) 151/77  (!) 165/68 (!) 165/68  Pulse:   70 (!) 52  Resp: 17  (!) 27 (!) 23  Temp:   98.1 F (36.7 C)   TempSrc:   Oral   SpO2:   95% 95%  Weight:  61.2 kg    Height:  4\' 10"  (1.473 m)      Isolation Precautions No active isolations  Medications Medications  iohexol (OMNIPAQUE) 350 MG/ML injection 100 mL (100 mLs Intravenous Contrast Given 04/22/23 1707)    Mobility walks     Focused Assessments Cardiac Assessment Handoff:  Cardiac Rhythm: Normal sinus rhythm Lab Results  Component Value Date   CKTOTAL 71 05/16/2008   CKMB 1.6 05/16/2008   TROPONINI 0.03        NO INDICATION OF MYOCARDIAL INJURY. 05/16/2008   No results found for: "DDIMER" Does the Patient currently have chest pain? No   , Neuro Assessment Handoff:  Swallow screen pass? Yes  Cardiac Rhythm: Normal sinus rhythm NIH Stroke Scale  Dizziness Present: No Headache Present: No Interval: Initial Level of Consciousness (1a.)   : Alert, keenly responsive LOC Questions (1b. )   : Answers both questions correctly LOC Commands (1c. )   : Performs both tasks correctly Best Gaze (2. )  : Normal Visual (3. )  : No visual loss Facial Palsy (4. )    : Minor paralysis Motor Arm, Left (5a. )   : No drift Motor Arm, Right (5b. ) : No drift Motor Leg, Left (6a. )  : No drift Motor Leg, Right (6b. ) : No drift Limb Ataxia (7. ): Absent Sensory (8. )  : Normal, no sensory  loss Best Language (9. )  : Mild-to-moderate aphasia Dysarthria (10. ): Mild-to-moderate dysarthria, patient slurs at least some words and, at worst, can be understood with some difficulty Extinction/Inattention (11.)   : No Abnormality Complete NIHSS TOTAL: 6 Last date known well: 04/22/23 Last time known well: 0100 Neuro Assessment:   Neuro Checks:   Initial (04/22/23 1700)  Has TPA been given? No If patient is a Neuro Trauma and patient is going to OR before floor call report to 4N Charge nurse: [phone] or [phone]   R Recommendations: See Admitting Provider Note  Report given to:   Additional Notes:

## 2023-04-22 NOTE — ED Triage Notes (Signed)
Patient BIB [facility] from home for stroke like symptoms. Patient reports she went to bed around 1 am feeling normal and woke up this morning and noticed slurred speech and facial droop. Family reports they last spoke to her yesterday at 4pm on the phone and last saw her on Tuesday. Symptoms discovered today at 52 when her nephew spoke to her on the phone. BP 161/82 HR 102 O2 94% RA, patient is on warfarin.

## 2023-04-22 NOTE — ED Notes (Signed)
PT passed swallow screen.

## 2023-04-23 ENCOUNTER — Observation Stay (HOSPITAL_BASED_OUTPATIENT_CLINIC_OR_DEPARTMENT_OTHER): Payer: Medicare HMO

## 2023-04-23 DIAGNOSIS — I6389 Other cerebral infarction: Secondary | ICD-10-CM

## 2023-04-23 DIAGNOSIS — I63312 Cerebral infarction due to thrombosis of left middle cerebral artery: Secondary | ICD-10-CM

## 2023-04-23 DIAGNOSIS — I63412 Cerebral infarction due to embolism of left middle cerebral artery: Secondary | ICD-10-CM | POA: Diagnosis not present

## 2023-04-23 LAB — ECHOCARDIOGRAM COMPLETE
Height: 58 in
S' Lateral: 2.3 cm
Weight: 2158.74 oz

## 2023-04-23 LAB — LIPID PANEL
Cholesterol: 171 mg/dL (ref 0–200)
HDL: 48 mg/dL (ref >40)
LDL Cholesterol: 108 mg/dL — ABNORMAL HIGH (ref 0–99)
Total CHOL/HDL Ratio: 3.6 RATIO
Triglycerides: 77 mg/dL (ref <150)
VLDL: 15 mg/dL (ref 0–40)

## 2023-04-23 LAB — PROTIME-INR
INR: 1.9 — ABNORMAL HIGH (ref 0.8–1.2)
Prothrombin Time: 22.4 seconds — ABNORMAL HIGH (ref 11.4–15.2)

## 2023-04-23 MED ORDER — WARFARIN SODIUM 5 MG PO TABS
5.0000 mg | ORAL_TABLET | Freq: Once | ORAL | Status: AC
  Administered 2023-04-23: 5 mg via ORAL
  Filled 2023-04-23: qty 1

## 2023-04-23 NOTE — Progress Notes (Signed)
[facility] Progress Note Patient: Rhonda Contreras ZOX:[id number] DOB: 1951/06/05 DOA: 04/22/2023  DOS: the patient was seen and examined on 04/23/2023  Brief hospital course: PMH of type II DM, HTN, lupus, CVA with dysarthria and right facial droop.  Found to have left MCA infarct as well as left carotid thrombus in the setting of subtherapeutic INR. Neurology was consulted. Recommend continuing warfarin but increasing therapeutic range to 2.5/3.5. Assessment and Plan: Acute left MCA infarct. Subtherapeutic INR. Neurology was consulted. Code stroke CT shows no evidence of PE. CTA shows filling defect in left distal common carotid concerning for luminal thrombus. MRI shows left MCA infarct. Symptoms significantly improved. Echocardiogram shows preserved EF. LDL 108 on statin. Hemoglobin A1c currently pending. Per neurology recommending continuing Coumadin but increasing INR range to 2.5-3.5.  Known history of lupus. On Coumadin. Managed by Dr. Eula Listen. INR goal range 2.5-3.5 now. At present pharmacy is unable to calculate home regimen for given limited information on the INR. Will recheck INR today and tomorrow and decide on home regimen.  HTN. Continuing current regimen. Allowing permissive hypertension.  HLD. On Lipitor 40 mg.  Type 2 diabetes mellitus. Hemoglobin A1c currently pending. Not on insulin at home. Holding oral hypoglycemic agent currently on sliding scale insulin.   Subjective: No acute complaint.  No nausea or vomiting.  Improving right-sided weakness.  Physical Exam: General: in Mild distress, No Rash Cardiovascular: S1 and S2 Present, No Murmur Respiratory: Good respiratory effort, Bilateral Air entry present. No Crackles, No wheezes Abdomen: Bowel Sound present, No tenderness Extremities: No edema Neuro: Alert and oriented x3, no new focal deficit, improving right-sided weakness.  Speech also clear.  Data Reviewed: I have Reviewed nursing notes,  Vitals, and Lab results. Since last encounter, pertinent lab results CBC and BMP   . I have ordered test including CBC and BMP and INR  . I have discussed pt's care plan and test results with neurology  .   Disposition: Status is: Observation SCD's Start: 04/22/23 2153   Family Communication: At bedside Level of care: Telemetry Medical   Vitals:   04/23/23 0458 04/23/23 0737 04/23/23 1155 04/23/23 1618  BP: (!) 161/72 (!) 162/106 118/71 (!) 145/74  Pulse: 81 61 80 84  Resp: 18 18 18 18   Temp: 97.9 F (36.6 C) 98.3 F (36.8 C) 98.3 F (36.8 C) (!) 95.6 F (35.3 C)  TempSrc: Oral Oral Oral   SpO2: 96% 96% 97% 94%  Weight:      Height:         Author: Lynden Oxford, MD 04/23/2023 5:50 PM  Please look on [email or web address] to find out who is on call.

## 2023-04-23 NOTE — Progress Notes (Signed)
  Echocardiogram 2D Echocardiogram has been performed.  Delcie Roch 04/23/2023, 9:05 AM

## 2023-04-23 NOTE — Care Management Obs Status (Signed)
MEDICARE OBSERVATION STATUS NOTIFICATION   Patient Details  Name: Rhonda Contreras MRN: [id number] Date of Birth: [birth year]   Medicare Observation Status Notification Given:  Yes    Darald Uzzle G., RN 04/23/2023, 2:21 PM

## 2023-04-23 NOTE — Hospital Course (Signed)
Brief hospital course: PMH of type II DM, HTN, lupus, CVA with dysarthria and right facial droop.  Found to have left MCA infarct as well as left carotid thrombus in the setting of subtherapeutic INR. Neurology was consulted. Recommend continuing warfarin but increasing therapeutic range to 2.5/3.5. Assessment and Plan: Acute left MCA infarct. Subtherapeutic INR. Neurology was consulted. Code stroke CT shows no evidence of PE. CTA shows filling defect in left distal common carotid concerning for luminal thrombus. MRI shows left MCA infarct. Symptoms significantly improved. Echocardiogram shows preserved EF. LDL 108 on statin. Hemoglobin A1c currently pending. Per neurology recommending continuing Coumadin but increasing INR range to 2.5-3.5.  Known history of lupus. On Coumadin. Managed by Dr. Eula Listen. INR goal range 2.5-3.5 now. At present pharmacy is unable to calculate home regimen for given limited information on the INR. Will recheck INR today and tomorrow and decide on home regimen.  HTN. Continuing current regimen. Allowing permissive hypertension.  HLD. On Lipitor 40 mg.  Type 2 diabetes mellitus. Hemoglobin A1c currently pending. Not on insulin at home. Holding oral hypoglycemic agent currently on sliding scale insulin.

## 2023-04-23 NOTE — Evaluation (Signed)
Speech Language Pathology Evaluation Patient Details Name: Rhonda Contreras MRN: [id number] DOB: [birth year] Today's Date: 04/23/2023 Time: 1410-1433 SLP Time Calculation (min) (ACUTE ONLY): 23 min  Problem List:  Patient Active Problem List   Diagnosis Date Noted   CVA (cerebral vascular accident) (HCC) 04/22/2023   Hypercalcemia 05/04/2020   ARF (acute renal failure) (HCC) 05/04/2020   Coagulopathy (HCC) 05/04/2020   Hypoglycemia 05/04/2020   Diabetes mellitus type 2 in nonobese (HCC) 02/02/2018   Chronic anticoagulation 02/02/2018   Essential hypertension 02/02/2018   History of CVA (cerebrovascular accident) 02/02/2018   Closed displaced spiral fracture of shaft of left femur (HCC) 02/01/2018   Past Medical History:  Past Medical History:  Diagnosis Date   Arthritis    Cancer (HCC)    skin   Diabetes mellitus without complication (HCC)    Hypertension    Immune deficiency disorder (HCC)    Lupus (systemic lupus erythematosus) (HCC)    Renal disorder    Stage 2 KD   Stroke [facility])    2009   Past Surgical History:  Past Surgical History:  Procedure Laterality Date   CATARACT EXTRACTION Bilateral    FEMUR IM NAIL Left 02/02/2018   Procedure: LEFT FEMUR INTRAMEDULLARY (IM) NAIL;  Surgeon: Sheral Apley, MD;  Location: [facility] OR;  Service: Orthopedics;  Laterality: Left;   ORIF ELBOW FRACTURE Right 03/16/2023   Procedure: OPEN REDUCTION INTERNAL FIXATION (ORIF) ELBOW/OLECRANON FRACTURE;  Surgeon: [name], MD;  Location: [facility] ORS;  Service: Orthopedics;  Laterality: Right;   TONSILLECTOMY     HPI:  72 y.o. female presented to ED 6/28 with dysarthria, right-sided facial droop and right-sided weakness. + acute CVA of left MCA distribution. Past medical history significant of type 2 diabetes, hypertension, lupus, prior stroke. Pt a lot more oriented and alert since initial intake and reports "feeling back to normal".   Assessment / Plan / Recommendation Clinical  Impression  Pt seen for skilled ST cognitive linguistic evaluation. The pt and pt's family reports "feeling much better" and "back to normal mainly" from initial stroke onset. Pt lives alone in an apartment and receives assistance from her nephew and sister. The pt's family member stated that a doctor recently told them there are concerns for a dementia dx. The pt was given the SLUMS examination, where she received a score of 9/30, well below the normal limits. The pts relative areas of strengths were: visual spatial skills and immediate recall. The pts areas of relative weakness were: attention, orientation, delayed recall with interference, numeric calculation/registration, immediate recall with time constraint, registration and digit span, executive function with and without extrapolation. Following evaluation, the pt reports that she does not feel like her old self and that her thinking and memory skills are diminished following the stroke. Given min verbal cues, the pt had increased success of recall. The pt required delayed processing time for every question. The pt and pts family given brief education on the role of an SLP in rehab and stroke recovery. The pt presents with a mod-severe cognitive deficit and would benefit from home health skilled ST services to address her acute cognitive deficits and potential baseline deficits. The pt would also benefit from additional home health/and or family assistance with frequent supervision for iADLs due to severity of cognitive. SLP signing off at this time.    SLP Assessment  SLP Recommendation/Assessment: All further Speech Lanaguage Pathology  needs can be addressed in the next venue of care SLP Visit Diagnosis: Cognitive  communication deficit (R41.841)    Recommendations for follow up therapy are one component of a multi-disciplinary discharge planning process, led by the attending physician.  Recommendations may be updated based on patient status,  additional functional criteria and insurance authorization.    Follow Up Recommendations  Home health SLP    Assistance Recommended at Discharge  Frequent or constant Supervision/Assistance  Functional Status Assessment Patient has had a recent decline in their functional status and/or demonstrates limited ability to make significant improvements in function in a reasonable and predictable amount of time  Frequency and Duration min 2x/week  1 week      SLP Evaluation Cognition  Overall Cognitive Status: Impaired/Different from baseline Arousal/Alertness: Awake/alert Orientation Level: Oriented to situation;Oriented to person;Oriented to place Year: Other (Comment) ("1942 or uhhh I don't know") Month: June Day of Week: Incorrect Attention: Sustained Sustained Attention: Impaired Sustained Attention Impairment: Verbal complex;Functional complex;Functional basic Memory: Impaired Memory Impairment: Decreased short term memory Decreased Short Term Memory: Verbal complex;Functional complex Awareness: Impaired Awareness Impairment: Emergent impairment Problem Solving: Impaired Executive Function: Sequencing;Organizing Sequencing: Impaired Sequencing Impairment: Functional basic Organizing: Impaired Organizing Impairment: Functional basic Safety/Judgment: Other (comment) (Unable to determine definitively, suspected impairment)       Comprehension  Auditory Comprehension Overall Auditory Comprehension: Appears within functional limits for tasks assessed Yes/No Questions: Within Functional Limits Commands: Within Functional Limits Conversation: Simple Interfering Components: Attention;Processing speed;Motor planning EffectiveTechniques: Visual/Gestural cues;Repetition;Slowed speech;Extra processing time Visual Recognition/Discrimination Discrimination: Within Function Limits Reading Comprehension Reading Status: Within funtional limits    Expression Expression Primary Mode of  Expression: Verbal Verbal Expression Overall Verbal Expression: Appears within functional limits for tasks assessed Initiation: No impairment Level of Generative/Spontaneous Verbalization: Conversation Repetition: No impairment Naming: No impairment Pragmatics: No impairment Written Expression Dominant Hand: Right Written Expression: Not tested (pt had a recent (approx 3 weeks) bone break on her right side per pt and family reports. This has affected writing ability)   Oral / Motor  Motor Speech Overall Motor Speech: Appears within functional limits for tasks assessed Respiration: Within functional limits Phonation: Normal Resonance: Within functional limits Articulation: Impaired (slight dysarthria, function WNL) Intelligibility: Not tested            Dione Housekeeper M.S. CF-SLP

## 2023-04-23 NOTE — Plan of Care (Signed)
  Problem: Education: Goal: Knowledge of disease or condition will improve Outcome: Progressing Goal: Knowledge of secondary prevention will improve (MUST DOCUMENT ALL) Outcome: Progressing Goal: Knowledge of patient specific risk factors will improve (Mark N/A or DELETE if not current risk factor) Outcome: Progressing   Problem: Ischemic Stroke/TIA Tissue Perfusion: Goal: Complications of ischemic stroke/TIA will be minimized Outcome: Progressing   Problem: Coping: Goal: Will verbalize positive feelings about self Outcome: Progressing Goal: Will identify appropriate support needs Outcome: Progressing   Problem: Health Behavior/Discharge Planning: Goal: Ability to manage health-related needs will improve Outcome: Progressing Goal: Goals will be collaboratively established with patient/family Outcome: Progressing   Problem: Self-Care: Goal: Ability to participate in self-care as condition permits will improve Outcome: Progressing Goal: Verbalization of feelings and concerns over difficulty with self-care will improve Outcome: Progressing Goal: Ability to communicate needs accurately will improve Outcome: Progressing   

## 2023-04-23 NOTE — Evaluation (Signed)
Occupational Therapy Evaluation Patient Details Name: Rhonda Contreras MRN: [id number] DOB: 1951/01/30 Today's Date: 04/23/2023   History of Present Illness 72 y.o. female presented to ED 6/28 with dysarthria, right-sided facial droop and right-sided weakness. + acute CVA of left MCA distribution. Past medical history significant of type 2 diabetes, hypertension, lupus, prior stroke   Clinical Impression   PTA pt lives alone independently and has assistance form her nephew and sister for IADL tasks as needed. Pt with decreased functional use of her RUE, which is affecting her ability to complete functional tasks. Pt would greatly benefit from follow up OT at home to increase her safety and independence within her home. Acute OT to follow.      Recommendations for follow up therapy are one component of a multi-disciplinary discharge planning process, led by the attending physician.  Recommendations may be updated based on patient status, additional functional criteria and insurance authorization.   Assistance Recommended at Discharge Intermittent Supervision/Assistance  Patient can return home with the following Assistance with cooking/housework;A little help with bathing/dressing/bathroom;Assist for transportation;Direct supervision/assist for medications management    Functional Status Assessment  Patient has had a recent decline in their functional status and demonstrates the ability to make significant improvements in function in a reasonable and predictable amount of time.  Equipment Recommendations  BSC/3in1    Recommendations for Other Services       Precautions / Restrictions Precautions Precautions: Fall Restrictions Weight Bearing Restrictions: No      Mobility Bed Mobility Overal bed mobility: Modified Independent             General bed mobility comments: extra time no assist needed    Transfers Overall transfer level: Modified independent Equipment used:  None               General transfer comment: Performed from EOB x2 without physical assistance. First attempt pressing back of knees into bed. Able to easily reposition herself in recliner as well      Balance Overall balance assessment: Modified Independent                               Standardized Balance Assessment Standardized Balance Assessment : Berg Balance Test Berg Balance Test Sit to Stand: Able to stand  independently using hands Standing Unsupported: Able to stand safely 2 minutes Sitting with Back Unsupported but Feet Supported on Floor or Stool: Able to sit safely and securely 2 minutes Stand to Sit: Sits safely with minimal use of hands Transfers: Able to transfer safely, minor use of hands Standing Unsupported with Eyes Closed: Able to stand 10 seconds safely Standing Ubsupported with Feet Together: Able to place feet together independently and stand 1 minute safely From Standing, Reach Forward with Outstretched Arm: Can reach forward >12 cm safely (5") From Standing Position, Pick up Object from Floor: Able to pick up shoe safely and easily From Standing Position, Turn to Look Behind Over each Shoulder: Looks behind from both sides and weight shifts well Turn 360 Degrees: Able to turn 360 degrees safely one side only in 4 seconds or less Standing Unsupported, Alternately Place Feet on Step/Stool: Able to stand independently and complete 8 steps >20 seconds Standing Unsupported, One Foot in Front: Able to take small step independently and hold 30 seconds Standing on One Leg: Tries to lift leg/unable to hold 3 seconds but remains standing independently Total Score: 47  ADL either performed or assessed with clinical judgement   ADL Overall ADL's : Needs assistance/impaired Eating/Feeding: Supervision/ safety;Set up Eating/Feeding Details (indicate cue type and reason): difficulty with cutting food and opening containers; red tubing trialed but  did not help; using L hand to self feed Grooming: Minimal assistance   Upper Body Bathing: Set up;Supervision/ safety   Lower Body Bathing: Minimal assistance;Sit to/from stand   Upper Body Dressing : Minimal assistance;Sitting   Lower Body Dressing: Minimal assistance Lower Body Dressing Details (indicate cue type and reason): does not wear socks; uses slip on shoes Toilet Transfer: Modified Independent   Toileting- Clothing Manipulation and Hygiene: Supervision/safety       Functional mobility during ADLs: Modified independent       Vision Baseline Vision/History: 1 Wears glasses;4 Cataracts Ability to See in Adequate Light: 2 Moderately impaired Patient Visual Report: Blurring of vision Vision Assessment?: Vision impaired- to be further tested in functional context Additional Comments: states impaired vision at baseline - difficulty reading labels - may benefit fomr lable reader/AD for visually impaired, etc     Perception Perception Perception Tested?:  (WFL)   Praxis Praxis Praxis tested?: Within functional limits    Pertinent Vitals/Pain       Hand Dominance Right   Extremity/Trunk Assessment Upper Extremity Assessment Upper Extremity Assessment: Defer to OT evaluation;RUE deficits/detail (RUE weakness at least 3+/5 grossly) RUE Deficits / Details: generalized weakness wtih decreased functional use; shoulder flexion to @ 60; elbow ROM WFL; gross grasp/release; poor inhand manipulation skills; able to oppose thumb to digits with increased time- weak grasp makes ADL tasks difficult RUE Coordination: decreased fine motor;decreased gross motor   Lower Extremity Assessment Lower Extremity Assessment: Defer to PT evaluation (Mild Rt ankle DF weakness 4+/5)   Cervical / Trunk Assessment Cervical / Trunk Assessment: Other exceptions (forward head)   Communication Communication Communication: Expressive difficulties   Cognition Arousal/Alertness:  Awake/alert Behavior During Therapy: WFL for tasks assessed/performed Overall Cognitive Status: No family/caregiver present to determine baseline cognitive functioning                                 General Comments: Oriented to self, location, situation, states correct season but wrong year "2020"     General Comments       Exercises Exercises: Other exercises Other Exercises Other Exercises: red foam for grip and pinch strengthening Other Exercises: red tubing to roll to work on extension and moving tube around in hand to work on Camera operator Instructions      Home Living Family/patient expects to be discharged to:: Private residence Living Arrangements: Alone Available Help at Discharge: Family;Available PRN/intermittently Type of Home: Apartment Home Access: Level entry     Home Layout: One level     Bathroom Shower/Tub: Producer, television/film/video: Standard Bathroom Accessibility: Yes How Accessible: Accessible via walker Home Equipment: Cane - single point          Prior Functioning/Environment Prior Level of Function : History of Falls (last six months);Independent/Modified Independent             Mobility Comments: amb without AD, reports 1 fall after tripping a month ago - broke her R elbow ADLs Comments: Reports sister takes her to the store for groceries; nephew aslso assists        OT Problem List: Decreased strength;Decreased range of motion;Decreased coordination;Impaired vision/perception;Decreased knowledge of use of  DME or AE;Impaired UE functional use      OT Treatment/Interventions: Self-care/ADL training;Therapeutic exercise;Neuromuscular education;DME and/or AE instruction;Therapeutic activities;Visual/perceptual remediation/compensation;Patient/family education;Balance training    OT Goals(Current goals can be found in the care plan section) Acute Rehab OT Goals Patient Stated Goal: to be able to  take care of herself OT Goal Formulation: With patient Time For Goal Achievement: 05/07/23 Potential to Achieve Goals: Good  OT Frequency: Min 2X/week    Co-evaluation              AM-PAC OT "6 Clicks" Daily Activity     Outcome Measure Help from another person eating meals?: A Little Help from another person taking care of personal grooming?: A Little Help from another person toileting, which includes using toliet, bedpan, or urinal?: A Little Help from another person bathing (including washing, rinsing, drying)?: A Little Help from another person to put on and taking off regular upper body clothing?: A Little Help from another person to put on and taking off regular lower body clothing?: A Little 6 Click Score: 18   End of Session Nurse Communication: Mobility status  Activity Tolerance: Patient tolerated treatment well Patient left: in chair;with call bell/phone within reach;with chair alarm set  OT Visit Diagnosis: Muscle weakness (generalized) (M62.81);Low vision, both eyes (H54.2)                Time: 1610-9604 OT Time Calculation (min): 20 min Charges:  OT General Charges $OT Visit: 1 Visit OT Evaluation $OT Eval Moderate Complexity: 1 Mod  Angelice Piech, OT/L   Acute OT Clinical Specialist Acute Rehabilitation Services Pager [phone] Office [phone]   [facility] 04/23/2023, 12:39 PM

## 2023-04-23 NOTE — Evaluation (Addendum)
Physical Therapy Evaluation Patient Details Name: Rhonda Contreras MRN: [id number] DOB: 1951-07-28 Today's Date: 04/23/2023  History of Present Illness  72 y.o. female presented to ED 6/28 with dysarthria, right-sided facial droop and right-sided weakness. + acute CVA of left MCA distribution. Past medical history significant of type 2 diabetes, hypertension, lupus, prior stroke  Clinical Impression  Pt admitted with above diagnosis. Pt reports gait at baseline with hx of one fall last month. States she has SPC but typically does not use. BERG indicates moderate fall risk and guidelines suggest use of SPC outdoors, which I have encouraged pt to use indoors. Denies any LE symptoms or changes but notices RUE weakness present. Has family nearby that can check on her frequently as needed. Goes to bingo with sister once per week. Sister takes her shopping for necessities. Pt currently with functional limitations due to the deficits listed below (see PT Problem List). Pt will benefit from acute skilled PT to increase their independence and safety with mobility to allow discharge. Addendum: HHPT recommended for home safety follow-up. After OT evaluation, pt unsure if family can take to OP appointment for RUE and ADL deficits. Would start with HHPT until resources allow for transportation.          Recommendations for follow up therapy are one component of a multi-disciplinary discharge planning process, led by the attending physician.  Recommendations may be updated based on patient status, additional functional criteria and insurance authorization.     Assistance Recommended at Discharge PRN  Patient can return home with the following  Assist for transportation    Equipment Recommendations None recommended by PT     Functional Status Assessment Patient has had a recent decline in their functional status and demonstrates the ability to make significant improvements in function in a reasonable and  predictable amount of time.     Precautions / Restrictions Precautions Precautions: Fall Restrictions Weight Bearing Restrictions: No      Mobility  Bed Mobility Overal bed mobility: Modified Independent             General bed mobility comments: extra time no assist needed    Transfers Overall transfer level: Modified independent Equipment used: None               General transfer comment: Performed from EOB x2 without physical assistance. First attempt pressing back of knees into bed. Able to easily reposition herself in recliner as well    Ambulation/Gait Ambulation/Gait assistance: Supervision Gait Distance (Feet): 200 Feet Assistive device: None Gait Pattern/deviations: Step-through pattern, Drifts right/left, Decreased stride length Gait velocity: decr Gait velocity interpretation: <1.8 ft/sec, indicate of risk for recurrent falls   General Gait Details: Patient reports baseline gait. Supervision for safety without overt LOB with forward gait, backwards steps, and turns. Mild sway but able to self correct, no signs of buckling.  Stairs            Wheelchair Mobility    Modified Rankin (Stroke Patients Only) Modified Rankin (Stroke Patients Only) Pre-Morbid Rankin Score: Slight disability Modified Rankin: Slight disability     Balance Overall balance assessment: Modified Independent                               Standardized Balance Assessment Standardized Balance Assessment : Berg Balance Test Berg Balance Test Sit to Stand: Able to stand  independently using hands Standing Unsupported: Able to stand safely 2 minutes Sitting  with Back Unsupported but Feet Supported on Floor or Stool: Able to sit safely and securely 2 minutes Stand to Sit: Sits safely with minimal use of hands Transfers: Able to transfer safely, minor use of hands Standing Unsupported with Eyes Closed: Able to stand 10 seconds safely Standing Ubsupported with  Feet Together: Able to place feet together independently and stand 1 minute safely From Standing, Reach Forward with Outstretched Arm: Can reach forward >12 cm safely (5") From Standing Position, Pick up Object from Floor: Able to pick up shoe safely and easily From Standing Position, Turn to Look Behind Over each Shoulder: Looks behind from both sides and weight shifts well Turn 360 Degrees: Able to turn 360 degrees safely one side only in 4 seconds or less Standing Unsupported, Alternately Place Feet on Step/Stool: Able to stand independently and complete 8 steps >20 seconds Standing Unsupported, One Foot in Front: Able to take small step independently and hold 30 seconds Standing on One Leg: Tries to lift leg/unable to hold 3 seconds but remains standing independently Total Score: 47         Pertinent Vitals/Pain Pain Assessment Pain Assessment: No/denies pain    Home Living Family/patient expects to be discharged to:: Private residence Living Arrangements: Alone Available Help at Discharge: Family;Available PRN/intermittently Type of Home: Apartment Home Access: Level entry       Home Layout: One level Home Equipment: Cane - single point      Prior Function Prior Level of Function : History of Falls (last six months);Independent/Modified Independent             Mobility Comments: amb without AD, reports 1 fall after tripping a month ago ADLs Comments: Reports ind. sister takes her to the store for groceries     Hand Dominance   Dominant Hand: Right    Extremity/Trunk Assessment   Upper Extremity Assessment Upper Extremity Assessment: Defer to OT evaluation (RUE weakness at least 3+/5 grossly)    Lower Extremity Assessment Lower Extremity Assessment: Overall WFL for tasks assessed (Mild Rt ankle DF weakness 4+/5)       Communication   Communication: Expressive difficulties  Cognition Arousal/Alertness: Awake/alert Behavior During Therapy: WFL for tasks  assessed/performed Overall Cognitive Status: No family/caregiver present to determine baseline cognitive functioning                                 General Comments: Oriented to self, location, situation, states correct season but wrong year "2020"        General Comments      Exercises     Assessment/Plan    PT Assessment Patient needs continued PT services  PT Problem List Decreased range of motion;Decreased strength;Decreased activity tolerance;Decreased balance;Decreased mobility;Decreased coordination;Decreased knowledge of use of DME;Decreased cognition       PT Treatment Interventions DME instruction;Gait training;Functional mobility training;Therapeutic activities;Therapeutic exercise;Neuromuscular re-education;Balance training;Cognitive remediation;Patient/family education    PT Goals (Current goals can be found in the Care Plan section)  Acute Rehab PT Goals Patient Stated Goal: go home soon PT Goal Formulation: With patient Time For Goal Achievement: 04/29/23 Potential to Achieve Goals: Good    Frequency Min 4X/week     Co-evaluation               AM-PAC PT "6 Clicks" Mobility  Outcome Measure Help needed turning from your back to your side while in a flat bed without using bedrails?: None Help needed  moving from lying on your back to sitting on the side of a flat bed without using bedrails?: None Help needed moving to and from a bed to a chair (including a wheelchair)?: None Help needed standing up from a chair using your arms (e.g., wheelchair or bedside chair)?: None Help needed to walk in hospital room?: A Little Help needed climbing 3-5 steps with a railing? : A Little 6 Click Score: 22    End of Session   Activity Tolerance: Patient tolerated treatment well Patient left: in chair;with call bell/phone within reach;with chair alarm set Nurse Communication: Mobility status PT Visit Diagnosis: Unsteadiness on feet (R26.81);Other  abnormalities of gait and mobility (R26.89);History of falling (Z91.81);Hemiplegia and hemiparesis Hemiplegia - Right/Left: Right Hemiplegia - dominant/non-dominant: Dominant Hemiplegia - caused by: Cerebral infarction    Time: 4098-1191 PT Time Calculation (min) (ACUTE ONLY): 21 min   Charges:   PT Evaluation $PT Eval Low Complexity: 1 Low          Kathlyn Sacramento, PT, DPT [facility]  Re[facility] Services Physical Therapist Office: [phone] Website: [email or web address]   Berton Mount 04/23/2023, 12:20 PM

## 2023-04-23 NOTE — Progress Notes (Addendum)
STROKE TEAM PROGRESS NOTE   BRIEF HPI [name] is a 72 y.o. female with history of hypertension, diabetes, lupus on warfarin, prior CVA, CKD who presents to [facility], ED via EMS for evaluation of right facial droop and right-sided weakness. INR on admission is 1.9. She follows with [facility] and they manage her coumadin as well. Ideal INR goal is closer to 2.5. Plan to resume coumadin and follow up with PCP ASAP.  SIGNIFICANT HOSPITAL EVENTS 6/28- admit to hospital   INTERM HISTORY/SUBJECTIVE PT at the bedside. No follow up needed for PT, but does need OP OT for right hand weakness. She is agreeable to resuming coumadin.  Vital signs stable.   OBJECTIVE  CBC    Component Value Date/Time   WBC 9.5 04/22/2023 1651   RBC 4.62 04/22/2023 1651   HGB 14.6 04/22/2023 1710   HCT 43.0 04/22/2023 1710   PLT 187 04/22/2023 1651   MCV 92.6 04/22/2023 1651   MCH 29.4 04/22/2023 1651   MCHC 31.8 04/22/2023 1651   RDW 14.4 04/22/2023 1651   LYMPHSABS 1.2 04/22/2023 1651   MONOABS 0.7 04/22/2023 1651   EOSABS 0.2 04/22/2023 1651   BASOSABS 0.0 04/22/2023 1651    BMET    Component Value Date/Time   NA 141 04/22/2023 1710   K 3.9 04/22/2023 1710   CL 109 04/22/2023 1710   CO2 20 (L) 04/22/2023 1651   GLUCOSE 179 (H) 04/22/2023 1710   BUN 18 04/22/2023 1710   CREATININE 0.70 04/22/2023 1710   CALCIUM 9.7 04/22/2023 1651   CALCIUM 11.2 (H) 05/04/2020 2217   GFRNONAA >60 04/22/2023 1651    IMAGING past 24 hours MR BRAIN WO CONTRAST  Result Date: 04/22/2023 CLINICAL DATA:  Initial evaluation for neuro deficit, stroke. EXAM: MRI HEAD WITHOUT CONTRAST TECHNIQUE: Multiplanar, multiecho pulse sequences of the brain and surrounding structures were obtained without intravenous contrast. COMPARISON:  CT from earlier the same day. FINDINGS: Brain: Examination moderately degraded by motion artifact. Generalized age-related cerebral atrophy with moderately advanced chronic  microvascular ischemic disease. Multiple remote lacunar infarcts present about the bilateral basal ganglia and thalami. Few scattered small remote bilateral cerebellar infarcts noted. Chronic cortical infarcts involving the high right frontoparietal region noted. Additional small remote posterior left frontal cortical infarct. Restricted diffusion involving the left frontal operculum and superior insula, consistent with acute left MCA distribution infarct (series 2, image 27). No associated hemorrhage or mass effect. No other evidence for acute or subacute ischemia. Gray-white matter differentiation otherwise maintained. No visible acute or chronic intracranial blood products. No mass lesion or midline shift. No hydrocephalus or extra-axial fluid collection. Pituitary gland and suprasellar region within normal limits. Vascular: Major intracranial vascular flow voids are maintained. Skull and upper cervical spine: Atlantoaxial assimilation with flattening of the occipital condyles noted. Congenital segmental fusion of C5 and C6. Bone marrow signal intensity normal. No scalp soft tissue abnormality. Sinuses/Orbits: Left gaze preference. Prior bilateral ocular lens replacement. Paranasal sinuses are clear. Trace left mastoid effusion, of doubtful significance. Other: None. IMPRESSION: 1. Acute left MCA distribution infarct involving the left frontal operculum and superior insula. No associated hemorrhage or mass effect. 2. Underlying age-related cerebral atrophy with moderately advanced chronic microvascular ischemic disease, with multiple remote infarcts as above. Electronically Signed   By: Rise Mu M.D.   On: 04/22/2023 22:49   CT ANGIO HEAD NECK W WO CM W PERF (CODE STROKE)  Result Date: 04/22/2023 CLINICAL DATA:  Stroke suspected EXAM: CT ANGIOGRAPHY  HEAD AND NECK TECHNIQUE: Multidetector CT imaging of the head and neck was performed using the standard protocol during bolus administration of  intravenous contrast. Multiplanar CT image reconstructions and MIPs were obtained to evaluate the vascular anatomy. Carotid stenosis measurements (when applicable) are obtained utilizing NASCET criteria, using the distal internal carotid diameter as the denominator. RADIATION DOSE REDUCTION: This exam was performed according to the departmental dose-optimization program which includes automated exposure control, adjustment of the mA and/or kV according to patient size and/or use of iterative reconstruction technique. CONTRAST:  OMNIPAQUE IOHEXOL 350 MG/ML SOLN COMPARISON:  No prior CTA available, correlation is made with CT head 04/22/2023 and MRA head 05/16/2008 FINDINGS: CT HEAD FINDINGS For noncontrast findings, please see same day CT head. 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 The left vertebral artery also originates from the aorta. Mild aortic atherosclerosis. No evidence of aortic dissection or aneurysm. Right carotid system: No evidence of dissection, occlusion, or hemodynamically significant stenosis (greater than 50%). Left carotid system: Peripheral filling defect in the distal left common carotid (series 5, image 232), concerning for luminal thrombus, which does not extend past the bifurcation. No evidence of hemodynamically significant stenosis, dissection or occlusion. Vertebral arteries: No evidence of dissection, occlusion, or hemodynamically significant stenosis (greater than 50%). Skeleton: No acute osseous abnormality. Severe degenerative changes in the cervical spine. Other neck: Multinodular goiter, with the largest discrete nodule measuring up to 2.0 cm (series 5, image 140). Upper chest: No focal pulmonary opacity or pleural effusion. Review of the MIP images confirms the above findings CTA HEAD FINDINGS Anterior  circulation: Both internal carotid arteries are patent to the termini, with mild stenosis in the right cavernous and left supraclinoid ICA A1 segments patent. Normal anterior communicating artery. Anterior cerebral arteries are patent to their distal aspects without significant stenosis. No M1 stenosis or occlusion. MCA branches perfused to their distal aspects without significant stenosis. Posterior circulation: Vertebral arteries patent to the vertebrobasilar junction without significant stenosis. Posterior inferior cerebellar arteries patent proximally. Basilar patent to its distal aspect without significant stenosis. Superior cerebellar arteries patent proximally. Patent P1 segments, hypoplastic on the right. Near fetal origin of the right PCA from the right posterior communicating artery. PCAs perfused to their distal aspects without significant stenosis. The left posterior communicating artery is not visualized. Venous sinuses: As permitted by contrast timing, patent. Anatomic variants: None significant. Review of the MIP images confirms the above findings CT Brain Perfusion Findings: ASPECTS: 10 CBF (<30%) Volume: 0mL Perfusion (Tmax>6.0s) volume: 29mL Mismatch Volume: 29mL Infarction Location:No infarct core. Areas of decreased perfusion are noted in multiple vascular territories, favored to be artifactual. IMPRESSION: 1. Peripheral filling defect in the distal left common carotid, concerning for luminal thrombus. 2. No intracranial large vessel occlusion. Mild stenosis in the right cavernous and left supraclinoid ICA. 3. No hemodynamically significant stenosis in the neck. 4. No infarct core. Areas of decreased perfusion are noted in multiple vascular territories, favored to be artifactual, possibly related to motion. 5. Multinodular goiter, with the largest discrete nodule measuring up to 2.0 cm. If this has not previously been evaluated, a non-emergent ultrasound of the thyroid is recommended. (Reference:  J Am Coll Radiol. 2015 Feb;12(2): 143-50) 6. Incidental note is made of variant aortic anatomy, with an aortic ring composed of an aberrant origin of the right  subclavian, which joins with the normal origin of the right subclavian. The left vertebral artery also originates from the aorta. Imaging results were communicated on 04/22/2023 at 5:36 pm to provider Dr. Amada Jupiter via secure text paging. Electronically Signed   By: Wiliam Ke M.D.   On: 04/22/2023 17:40   CT HEAD CODE STROKE WO CONTRAST  Result Date: 04/22/2023 CLINICAL DATA:  Code stroke.  Stroke suspected EXAM: CT HEAD WITHOUT CONTRAST TECHNIQUE: Contiguous axial images were obtained from the base of the skull through the vertex without intravenous contrast. RADIATION DOSE REDUCTION: This exam was performed according to the departmental dose-optimization program which includes automated exposure control, adjustment of the mA and/or kV according to patient size and/or use of iterative reconstruction technique. COMPARISON:  05/04/2020 FINDINGS: Brain: No evidence of acute infarction, hemorrhage, mass, mass effect, or midline shift. No hydrocephalus or extra-axial collection. Periventricular white matter changes, likely the sequela of chronic small vessel ischemic disease. Remote lacunar infarcts in the basal ganglia, thalami, and cerebellum. Vascular: No definite hyperdense vessel. There is increased density at the left ICA terminus and in the distal basilar artery, which appears unchanged from the prior exam. Atherosclerotic calcifications in the intracranial carotid and vertebral arteries. Skull: Negative for fracture or focal lesion. Sinuses/Orbits: No acute finding. Status post bilateral lens replacements. Other: None. ASPECTS [facility] Stroke Program Early CT Score) - Ganglionic level infarction (caudate, lentiform nuclei, internal capsule, insula, M1-M3 cortex): 7 - Supraganglionic infarction (M4-M6 cortex): 3 Total score (0-10 with 10 being  normal): 10 IMPRESSION: 1. No acute intracranial process. 2. ASPECTS is 10. Imaging results were communicated on 04/22/2023 at 5:13 pm to provider Dr. Amada Jupiter via secure text paging. Electronically Signed   By: Wiliam Ke M.D.   On: 04/22/2023 17:13    Vitals:   04/22/23 2212 04/22/23 2350 04/23/23 0458 04/23/23 0737  BP: 130/82 (!) 154/88 (!) 161/72 (!) 162/106  Pulse: (!) 108  81 61  Resp: 20 19 18 18   Temp: (!) 97.3 F (36.3 C) 98 F (36.7 C) 97.9 F (36.6 C) 98.3 F (36.8 C)  TempSrc: Oral Oral Oral Oral  SpO2: 96% 95% 96% 96%  Weight:      Height:         PHYSICAL EXAM General:  Alert, frail petite malnourished looking pleasant elderly Caucasian lady.  Patient in no acute distress Psych:  Mood and affect appropriate for situation CV: Regular rate and rhythm on monitor Respiratory:  Regular, unlabored respirations on room air GI: Abdomen soft and nontender   NEURO:  Mental Status: AA&Ox3, patient is able to give clear and coherent history Speech/Language: speech is without dysarthria or aphasia.  Naming, repetition, fluency, and comprehension intact.  Cranial Nerves:  II: PERRL. Visual fields full.  III, IV, VI: EOMI. Eyelids elevate symmetrically.  V: Sensation is intact to light touch and symmetrical to face.  VII: Mild right facial weakness. VIII: hearing intact to voice. IX, X: Palate elevates symmetrically. Phonation is normal.  IR:CVELFYBO shrug 5/5. XII: tongue is midline without fasciculations. Motor: Mild right grip weakness.  Diminished fine finger movements on the right.  Orbits left or right upper extremity.  Symmetric lower extremity strength. Tone: is normal and bulk is normal Sensation- Intact to light touch bilaterally. Extinction absent to light touch to DSS.   Coordination: FTN and RAM slowed on the right  Gait- deferred   ASSESSMENT/PLAN  Acute Ischemic Infarct:  left MCA distribution infarct  Etiology: Hypercoagulability in the setting  of lupus  on Coumadin, subtherapeutic INR Code Stroke CT head No acute abnormality. ASPECTS 10.    CTA head & neck peripheral filling defect in the distal left common carotid, concerning for luminal thrombus MRI acute left MCA distribution infarct involving the left frontal operculum and superior insula 2D Echo EF 60 to 65% LDL 108 HgbA1c No results found for requested labs within last 1095 days. VTE prophylaxis -Coumadin warfarin daily prior to admission, now on warfarin daily  Therapy recommendations:  Home Health OT Disposition: Home  Known lupus anticoagulant On coumadin Subtherapeutic INR at 1.9 Goal INR 2.5 Follows with the [facility] physicians  Hypertension Home meds: Lisinopril-hydrochlorothiazide, Lopressor, Norvasc Stable Blood Pressure Goal: BP less than 220/110   Hyperlipidemia Home meds: Atorvastatin 20 mg, may increase to 40 mg LDL 108, goal < 70 Continue statin at discharge  Diabetes type II Controlled Home meds: Metformin, glimepiride HgbA1c No results found for requested labs within last 1095 days., goal < 7.0 CBGs SSI Recommend close follow-up with PCP for better DM control   Hospital day # 0  Patient seen and examined by NP/APP with MD. MD to update note as needed.   Elmer Picker, DNP, FNP-BC [facility] Pager: [phone] STROKE MD NOTE :  I have personally obtained history,examined this patient, reviewed notes, independently viewed imaging studies, participated in medical decision making and plan of care.ROS completed by me personally and pertinent positives fully documented  I have made any additions or clarifications directly to the above note. Agree with note above.  Patient presented with facial droop and right hand weakness due to embolic left MCA infarct with slightly suboptimal anticoagulation on warfarin.  Continue warfarin with slightly higher target INR of 2.5-3.5.  Continue ongoing physical occupational therapy.  Mobilize out of  bed.   .  Discussed with Dr. Allena Katz.  Greater than 50% time during this 50-minute visit was spent in counseling and coordination of care about her stroke and hypercoagulability and need for more aggressive anticoagulation and answering questions.  Delia Heady, MD Medical Director [facility] Pager: [phone] 04/23/2023 1:43 PM  To contact Stroke Continuity provider, please refer to [email or web address]. After hours, contact General Neurology

## 2023-04-23 NOTE — Plan of Care (Signed)

## 2023-04-23 NOTE — Progress Notes (Addendum)
ANTICOAGULATION CONSULT NOTE -follow up  Pharmacy Consult for Warfarin  Indication:  Lupus anti-coagulant  Allergies  Allergen Reactions   Tape Other (See Comments)    CERTAIN MEDICAL TAPES HURT AND BURN THE SKIN    Patient Measurements: Height: 4\' 10"  (147.3 cm) Weight: 61.2 kg (134 lb 14.7 oz) IBW/kg (Calculated) : 40.9 Vital Signs: Temp: 98.3 F (36.8 C) (06/29 1155) Temp Source: Oral (06/29 1155) BP: 118/71 (06/29 1155) Pulse Rate: 80 (06/29 1155)  Labs: Recent Labs    04/22/23 1651 04/22/23 1710 04/23/23 0352  HGB 13.6 14.6  --   HCT 42.8 43.0  --   PLT 187  --   --   APTT 64*  --   --   LABPROT 23.1*  --  22.4*  INR 2.0*  --  1.9*  CREATININE 0.81 0.70  --      Estimated Creatinine Clearance: 47.7 mL/min (by C-G formula based on SCr of 0.7 mg/dL).   Medical History: Past Medical History:  Diagnosis Date   Arthritis    Cancer (HCC)    skin   Diabetes mellitus without complication (HCC)    Hypertension    Immune deficiency disorder (HCC)    Lupus (systemic lupus erythematosus) (HCC)    Renal disorder    Stage 2 KD   Stroke [facility])    2009    Assessment: 72 y/o F on warfarin PTA for lupus anti-coagulant, presented to the ED with facial droop and weakness, found to have left common carotid luminal thrombus, INR = 2 on admit 04/22/23, neurology recommends to continue warfarin for now, only start heparin if INR becomes sub-therapeutic.  (04/21/28)   UPDATE 04/23/23:   Acute CVA, luminal thrombus in the setting of lupus anticoagulant on Coumadin.  INR  down to 1.9 today, subtherapeutic. CBC on 6/28 wnl. No bleeding reported.  Dr. Allena Katz discussed w/Dr. Pearlean Brownie (neuro) regarding if patient needs bridge with heparin IV and Dr. Pearlean Brownie said no heparin.   Dr. Allena Katz has informed me that Dr. Pearlean Brownie wants INR goal 2.5-3.5   PTA: Warfarin 5mg  - takes 5mg  qMF and 2.5mg  all other days of the week per patient report> confirmed per note at [facility] IM at [facility] on:  04/19/2023    Goal of Therapy:  INR 2.5-3.5  (per Dr.Sethi/Dr. Allena Katz) Monitor platelets by anticoagulation protocol: Yes   Plan:  Warfarin 5 mg PO x 1 now Daily PT/INR Monitor for bleeding Consider heparin if INR falls below 2   Thank you for allowing pharmacy to be part of this patients care team. Noah Delaine, RPh Clinical Pharmacist 04/23/2023 1:46 PM  Please check AMION for all [facility] Pharmacy phone numbers After 10:00 PM, call Main Pharmacy [phone]

## 2023-04-24 DIAGNOSIS — I639 Cerebral infarction, unspecified: Secondary | ICD-10-CM | POA: Diagnosis not present

## 2023-04-24 DIAGNOSIS — I63412 Cerebral infarction due to embolism of left middle cerebral artery: Secondary | ICD-10-CM | POA: Diagnosis not present

## 2023-04-24 DIAGNOSIS — R531 Weakness: Secondary | ICD-10-CM | POA: Diagnosis not present

## 2023-04-24 LAB — PROTIME-INR
INR: 2.4 — ABNORMAL HIGH (ref 0.8–1.2)
Prothrombin Time: 26.3 seconds — ABNORMAL HIGH (ref 11.4–15.2)

## 2023-04-24 MED ORDER — WARFARIN SODIUM 5 MG PO TABS: 2.5000 mg | ORAL_TABLET | Freq: Every day | ORAL | 0 refills | Status: DC

## 2023-04-24 MED ORDER — WARFARIN SODIUM 2.5 MG PO TABS
2.5000 mg | ORAL_TABLET | Freq: Once | ORAL | Status: AC
  Administered 2023-04-24: 2.5 mg via ORAL
  Filled 2023-04-24: qty 1

## 2023-04-24 NOTE — Progress Notes (Addendum)
Patient [place] in need of BSC due to weakness.

## 2023-04-24 NOTE — Plan of Care (Signed)
Patient discharging home with Rhonda Contreras (niece). AVS printed and medications reviewed.   Problem: Education: Goal: Knowledge of disease or condition will improve Outcome: Completed/Met Goal: Knowledge of secondary prevention will improve (MUST DOCUMENT ALL) Outcome: Completed/Met Goal: Knowledge of patient specific risk factors will improve Rhonda Contreras N/A or DELETE if not current risk factor) Outcome: Completed/Met   Problem: Ischemic Stroke/TIA Tissue Perfusion: Goal: Complications of ischemic stroke/TIA will be minimized Outcome: Completed/Met   Problem: Coping: Goal: Will verbalize positive feelings about self Outcome: Completed/Met Goal: Will identify appropriate support needs Outcome: Completed/Met   Problem: Health Behavior/Discharge Planning: Goal: Ability to manage health-related needs will improve Outcome: Completed/Met Goal: Goals will be collaboratively established with patient/family Outcome: Completed/Met   Problem: Self-Care: Goal: Ability to participate in self-care as condition permits will improve Outcome: Completed/Met Goal: Verbalization of feelings and concerns over difficulty with self-care will improve Outcome: Completed/Met Goal: Ability to communicate needs accurately will improve Outcome: Completed/Met   Problem: Nutrition: Goal: Risk of aspiration will decrease Outcome: Completed/Met Goal: Dietary intake will improve Outcome: Completed/Met   Problem: Education: Goal: Knowledge of General Education information will improve Description: Including pain rating scale, medication(s)/side effects and non-pharmacologic comfort measures Outcome: Completed/Met   Problem: Health Behavior/Discharge Planning: Goal: Ability to manage health-related needs will improve Outcome: Completed/Met   Problem: Clinical Measurements: Goal: Ability to maintain clinical measurements within normal limits will improve Outcome: Completed/Met Goal: Will remain free from  infection Outcome: Completed/Met Goal: Diagnostic test results will improve Outcome: Completed/Met Goal: Respiratory complications will improve Outcome: Completed/Met Goal: Cardiovascular complication will be avoided Outcome: Completed/Met   Problem: Activity: Goal: Risk for activity intolerance will decrease Outcome: Completed/Met   Problem: Nutrition: Goal: Adequate nutrition will be maintained Outcome: Completed/Met   Problem: Coping: Goal: Level of anxiety will decrease Outcome: Completed/Met   Problem: Elimination: Goal: Will not experience complications related to bowel motility Outcome: Completed/Met Goal: Will not experience complications related to urinary retention Outcome: Completed/Met   Problem: Pain Managment: Goal: General experience of comfort will improve Outcome: Completed/Met   Problem: Safety: Goal: Ability to remain free from injury will improve Outcome: Completed/Met   Problem: Skin Integrity: Goal: Risk for impaired skin integrity will decrease Outcome: Completed/Met

## 2023-04-24 NOTE — Progress Notes (Signed)
Occupational Therapy Treatment Patient Details Name: Rhonda Contreras MRN: [id number] DOB: 1951-01-12 Today's Date: 04/24/2023   History of present illness 72 y.o. female presented to ED 6/28 with dysarthria, right-sided facial droop and right-sided weakness. + acute CVA of left MCA distribution. Past medical history significant of type 2 diabetes, hypertension, lupus, prior stroke   OT comments  Pt. Seen for skilled OT treatment session.  Provided yellow theraputty with demo and return demo from pt. handouts also provided including other fine motor activities and services for the blind to aide in any AE or modifications for visual impairments.  Pt. Able to complete standing grooming tasks with min a for open/close of smaller items and lids.  Agree with current d/c recommendations.  Will cont. With acute OT POC.     Recommendations for follow up therapy are one component of a multi-disciplinary discharge planning process, led by the attending physician.  Recommendations may be updated based on patient status, additional functional criteria and insurance authorization.    Assistance Recommended at Discharge Intermittent Supervision/Assistance  Patient can return home with the following  Assistance with cooking/housework;A little help with bathing/dressing/bathroom;Assist for transportation;Direct supervision/assist for medications management   Equipment Recommendations  BSC/3in1    Recommendations for Other Services      Precautions / Restrictions Precautions Precautions: Fall       Mobility Bed Mobility                    Transfers                         Balance                                           ADL either performed or assessed with clinical judgement   ADL Overall ADL's : Needs assistance/impaired     Grooming: Minimal assistance;Standing;Cueing for compensatory techniques Grooming Details (indicate cue type and reason):  encouragement to use bues to stabalize and hold items so other hand can assist, pt. unable to return demo with use of tube of t.paste and required assistance to open/close                               General ADL Comments: hep for theraputty and fine motor provided, also servies for the blind handout and reviewed-states her nephew and help her contact and see how to proceed    Extremity/Trunk Assessment              Vision       Perception     Praxis      Cognition Arousal/Alertness: Awake/alert Behavior During Therapy: WFL for tasks assessed/performed Overall Cognitive Status: Within Functional Limits for tasks assessed                                          Exercises Other Exercises Other Exercises: yellow theraputty provied, reviewed handout and pt. able to demo all indicated exercises and use for the putty.  reviewed other examples of how to strengthen and engage use of digits [employer], cards ect. handout for those activities also provided )    Shoulder Instructions       General  Comments      Pertinent Vitals/ Pain       Pain Assessment Pain Assessment: No/denies pain  Home Living                                          Prior Functioning/Environment              Frequency  Min 2X/week        Progress Toward Goals  OT Goals(current goals can now be found in the care plan section)  Progress towards OT goals: Progressing toward goals     Plan Discharge plan remains appropriate    Co-evaluation                 AM-PAC OT "6 Clicks" Daily Activity     Outcome Measure   Help from another person eating meals?: A Little Help from another person taking care of personal grooming?: A Little Help from another person toileting, which includes using toliet, bedpan, or urinal?: A Little Help from another person bathing (including washing, rinsing, drying)?: A Little Help from another  person to put on and taking off regular upper body clothing?: A Little Help from another person to put on and taking off regular lower body clothing?: A Little 6 Click Score: 18    End of Session    OT Visit Diagnosis: Muscle weakness (generalized) (M62.81);Low vision, both eyes (H54.2)   Activity Tolerance Patient tolerated treatment well   Patient Left in bed;with call bell/phone within reach   Nurse Communication Other (comment) (rn states ok to work with pt. today, in pts. room with pt. at end of session)        Time: 1122-1140 OT Time Calculation (min): 18 min  Charges: OT General Charges $OT Visit: 1 Visit OT Treatments $Self Care/Home Management : 8-22 mins  Boneta Lucks, COTA/L Acute Rehabilitation [phone]   Alessandra Bevels Lorraine-COTA/L 04/24/2023, 12:24 PM

## 2023-04-24 NOTE — Progress Notes (Signed)
ANTICOAGULATION CONSULT NOTE -follow up  Pharmacy Consult for Warfarin  Indication:  Lupus anti-coagulant  Allergies  Allergen Reactions   Tape Other (See Comments)    CERTAIN MEDICAL TAPES HURT AND BURN THE SKIN    Patient Measurements: Height: 4\' 10"  (147.3 cm) Weight: 61.2 kg (134 lb 14.7 oz) IBW/kg (Calculated) : 40.9 Vital Signs: Temp: 97.5 F (36.4 C) (06/30 0743) Temp Source: Oral (06/30 0743) BP: 160/79 (06/30 0743) Pulse Rate: 83 (06/30 0743)  Labs: Recent Labs    04/22/23 1651 04/22/23 1710 04/23/23 0352 04/24/23 0530  HGB 13.6 14.6  --   --   HCT 42.8 43.0  --   --   PLT 187  --   --   --   APTT 64*  --   --   --   LABPROT 23.1*  --  22.4* 26.3*  INR 2.0*  --  1.9* 2.4*  CREATININE 0.81 0.70  --   --      Estimated Creatinine Clearance: 47.7 mL/min (by C-G formula based on SCr of 0.7 mg/dL).   Medical History: Past Medical History:  Diagnosis Date   Arthritis    Cancer (HCC)    skin   Diabetes mellitus without complication (HCC)    Hypertension    Immune deficiency disorder (HCC)    Lupus (systemic lupus erythematosus) (HCC)    Renal disorder    Stage 2 KD   Stroke [facility])    2009    Assessment: 72 y/o F on warfarin PTA for lupus anti-coagulant, presented to the ED with facial droop and weakness, found to have left common carotid luminal thrombus, INR = 2 on admit 04/22/23, neurology recommends to continue warfarin for now, only start heparin if INR becomes sub-therapeutic.  (04/21/28)     Acute CVA, luminal thrombus in the setting of lupus anticoagulant on Coumadin.   INR 2.4, slightly below new goal of 2.5-3.5 per Dr. Pearlean Brownie and Dr. Allena Katz.  No bleeding reported.  PTA: Warfarin 5mg  - takes 5mg  qMF and 2.5mg  all other days of the week per patient report> confirmed per note at [facility] IM at [facility] on: 04/19/2023   If discharged today 6/30, I recommend Warfarin 5mg  every MWF and 2.5mg  every TTSS.   Will need INR check early this week.     Goal of Therapy:  INR 2.5-3.5  (per Dr.Sethi/Dr. Allena Katz) Monitor platelets by anticoagulation protocol: Yes   Plan:  Warfarin 2.5 mg PO x 1 now Daily PT/INR Monitor for bleeding    Thank you for allowing pharmacy to be part of this patients care team. Noah Delaine, RPh Clinical Pharmacist 04/24/2023 11:51 AM  Please check AMION for all [facility] Pharmacy phone numbers After 10:00 PM, call Main Pharmacy [phone]

## 2023-04-24 NOTE — Progress Notes (Signed)
RNCM received DME order for [facility].  Jasmine at [employer] contacted with order and confirmation received.  DME to be delivered to patient's hospital room prior to discharge home today.

## 2023-04-24 NOTE — Plan of Care (Signed)
  Problem: Education: Goal: Knowledge of disease or condition will improve Outcome: Progressing Goal: Knowledge of patient specific risk factors will improve Rhonda Contreras N/A or DELETE if not current risk factor) Outcome: Progressing   Problem: Health Behavior/Discharge Planning: Goal: Ability to manage health-related needs will improve Outcome: Progressing Goal: Goals will be collaboratively established with patient/family Outcome: Progressing

## 2023-04-25 DIAGNOSIS — D6862 Lupus anticoagulant syndrome: Secondary | ICD-10-CM | POA: Diagnosis present

## 2023-04-25 LAB — HEMOGLOBIN A1C
Hgb A1c MFr Bld: 6.6 % — ABNORMAL HIGH (ref 4.8–5.6)
Mean Plasma Glucose: 143 mg/dL

## 2023-04-25 NOTE — Discharge Summary (Signed)
Physician Discharge Summary   Patient: Rhonda Contreras MRN: [id number] DOB: [birth year]  Admit date:     04/22/2023  Discharge date: 04/24/2023  Discharge Physician: Lynden Oxford  PCP: Georgann Housekeeper, MD  Recommendations at discharge: Follow up with PCP in 1 week.  Need INR on Tuesday or Wednesday   Follow-up Information     Micki Riley, MD Follow up in 8 week(s).   Specialties: Neurology, Radiology Why: May follow with NP/PA Contact information: [address] [place] [address] [phone]         Georgann Housekeeper, MD. Schedule an appointment as soon as possible for a visit in 1 week(s).   Specialty: Internal Medicine Why: INR check on 7/2 or 7/3. Goal INR 2.5-3.5 Contact information: [address] [place] [address] [phone]                Discharge Diagnoses: Principal Problem:   CVA (cerebral vascular accident) [facility]) Active Problems:   Diabetes mellitus type 2 in nonobese [facility])   Chronic anticoagulation   Essential hypertension   Lupus anticoagulant disorder (HCC)  Brief hospital course: PMH of type II DM, HTN, lupus, CVA with dysarthria and right facial droop.  Found to have left MCA infarct as well as left carotid thrombus in the setting of subtherapeutic INR. Neurology was consulted. Recommend continuing warfarin but increasing therapeutic range to 2.5/3.5. Assessment and Plan: Acute left MCA infarct. Subtherapeutic INR. Neurology was consulted. Code stroke CT shows no evidence of PE. CTA shows filling defect in left distal common carotid concerning for luminal thrombus. MRI shows left MCA infarct. Symptoms significantly improved. Echocardiogram shows preserved EF. LDL 108 on statin. Hemoglobin A1c currently pending. Per neurology recommending continuing Coumadin but increasing INR range to 2.5-3.5.  Known history of lupus. On Coumadin. Managed by Dr. Eula Listen. INR goal range 2.5-3.5 now. Dose increased  recommended MWF 5 mg and TTSS 2.5 mg instead of MF 5 mg and TWTFS 2.5  HTN. Continuing current regimen.  HLD. On Lipitor 40 mg.  Type 2 diabetes mellitus. Hemoglobin A1c currently pending. Not on insulin at home. Resume home regimen   Consultants:  Neurology   Procedures performed:  Echocardiogram   DISCHARGE MEDICATION: Allergies as of 04/24/2023       Reactions   Tape Other (See Comments)   CERTAIN MEDICAL TAPES HURT AND BURN THE SKIN        Medication List     STOP taking these medications    lisinopril-hydrochlorothiazide 10-12.5 MG tablet Commonly known as: ZESTORETIC   phosphorus 155-852-130 MG tablet Commonly known as: K PHOS NEUTRAL   polyethylene glycol 17 g packet Commonly known as: MIRALAX / GLYCOLAX       TAKE these medications    alendronate 70 MG tablet Commonly known as: FOSAMAX Take 70 mg by mouth every Saturday.   amLODipine 10 MG tablet Commonly known as: NORVASC Take 10 mg by mouth daily.   atorvastatin 20 MG tablet Commonly known as: LIPITOR Take 20 mg by mouth daily.   docusate sodium 100 MG capsule Commonly known as: Colace Take 1 capsule (100 mg total) by mouth 2 (two) times daily. To prevent constipation while taking pain medication.   glimepiride 1 MG tablet Commonly known as: AMARYL Take 1 mg by mouth daily.   loratadine 10 MG tablet Commonly known as: CLARITIN Take 10 mg by mouth in the morning.   metFORMIN 1000 MG tablet Commonly known as: GLUCOPHAGE Take 1,000 mg  by mouth at bedtime.   metoprolol tartrate 100 MG tablet Commonly known as: LOPRESSOR Take 100 mg by mouth 2 (two) times daily. What changed: Another medication with the same name was removed. Continue taking this medication, and follow the directions you see here.   warfarin 5 MG tablet Commonly known as: COUMADIN Take 0.5-1 tablets (2.5-5 mg total) by mouth daily. Takes 1 tablet (5mg ) every Monday, Wednesday and Friday and takes 1/2 tablet (2.5  mg) all other days of the week(Tuesday, Thursday, Saturday and Sunday). What changed: additional instructions       Disposition: Home Diet recommendation: Cardiac and Carb modified diet  Discharge Exam: Vitals:   04/24/23 0024 04/24/23 0501 04/24/23 0743 04/24/23 1153  BP: (!) 136/108 (!) 169/74 (!) 160/79 134/81  Pulse:  76 83 81  Resp:  16 18 17  Temp: 98.3 F (36.8 C) 98 F (36.7 C) (!) 97.5 F (36.4 C) (!) 97.4 F (36.3 C)  TempSrc: Oral Oral Oral Oral  SpO2: 97% 95% 97% 98%  Weight:      Height:       General: Appear in no distress; no visible Abnormal Neck Mass Or lumps, Conjunctiva normal Cardiovascular: S1 and S2 Present, no Murmur, Respiratory: good respiratory effort, Bilateral Air entry present and CTA, no Crackles, no wheezes Abdomen: Bowel Sound present, Non tender  Extremities: no Pedal edema Neurology: alert and oriented to time, place, and person Mild right grip weakness.  Filed Weights   04/22/23 1753  Weight: 61.2 kg   Condition at discharge: stable  The results of significant diagnostics from this hospitalization (including imaging, microbiology, ancillary and laboratory) are listed below for reference.   Imaging Studies: ECHOCARDIOGRAM COMPLETE  Result Date: 04/23/2023    ECHOCARDIOGRAM REPORT   Patient Name:   Rhonda Contreras Date of Exam: 04/23/2023 Medical Rec #:  [id number]      Height:       58.0 in Accession #:    [id number]     Weight:       134.9 lb Date of Birth:  01/07/1951      BSA:          1.541 m Patient Age:    72 years       BP:           162/106 mmHg Patient Gender: F              HR:           87  bpm. Exam Location:  Inpatient Procedure: 2D Echo, Cardiac Doppler and Color Doppler Indications:    stroke  History:        Patient has prior history of Echocardiogram examinations, most                 recent 05/20/2008. Arrythmias:Atrial Fibrillation; Risk                 Factors:Diabetes and Hypertension.  Sonographer:    Delcie Roch RDCS  Referring Phys: [id number] ROBERT DORRELL IMPRESSIONS  1. Left ventricular ejection fraction, by estimation, is 60 to 65%. The left ventricle has normal function. The left ventricle has no regional wall motion abnormalities. Left ventricular diastolic function could not be evaluated.  2. Right ventricular systolic function is normal. The right ventricular size is normal. There is normal pulmonary artery systolic pressure.  3. The mitral valve is grossly normal. No evidence of mitral valve regurgitation. No evidence of mitral stenosis.  4. The aortic valve was not  well visualized. Aortic valve regurgitation is not visualized. No aortic stenosis is present.  5. The inferior vena cava is normal in size with greater than 50% respiratory variability, suggesting right atrial pressure of 3 mmHg. Comparison(s): No prior Echocardiogram. FINDINGS  Left Ventricle: Left ventricular ejection fraction, by estimation, is 60 to 65%. The left ventricle has normal function. The left ventricle has no regional wall motion abnormalities. The left ventricular internal cavity size was normal in size. There is  no left ventricular hypertrophy. Left ventricular diastolic function could not be evaluated due to nondiagnostic images. Left ventricular diastolic function could not be evaluated. Right Ventricle: The right ventricular size is normal. No increase in right ventricular wall thickness. Right ventricular systolic function is normal. There is normal pulmonary artery systolic pressure. The tricuspid regurgitant velocity is 2.77 m/s, and  with an assumed right atrial pressure of 3 mmHg, the estimated right ventricular systolic pressure is 33.7 mmHg. Left Atrium: Left atrial size was normal in size. Right Atrium: Right atrial size was normal in size. Pericardium: There is no evidence of pericardial effusion. Mitral Valve: The mitral valve is grossly normal. No evidence of mitral valve regurgitation. No evidence of mitral valve stenosis.  Tricuspid Valve: The tricuspid valve is grossly normal. Tricuspid valve regurgitation is mild . No evidence of tricuspid stenosis. Aortic Valve: The aortic valve was not well visualized. Aortic valve regurgitation is not visualized. No aortic stenosis is present. Pulmonic Valve: The pulmonic valve was not well visualized. Pulmonic valve regurgitation is not visualized. No evidence of pulmonic stenosis. Aorta: The aortic root is normal in size and structure. Venous: The inferior vena cava is normal in size with greater than 50% respiratory variability, suggesting right atrial pressure of 3 mmHg. IAS/Shunts: No atrial level shunt detected by color flow Doppler.  LEFT VENTRICLE PLAX 2D LVIDd:         3.60 cm LVIDs:         2.30 cm LV PW:         0.90 cm LV IVS:        1.00 cm LVOT diam:     1.70 cm LV SV:         51 LV SV Index:   33 LVOT Area:     2.27 cm  IVC IVC diam: 1.50 cm LEFT ATRIUM             Index        RIGHT ATRIUM           Index LA diam:        2.80 cm 1.82 cm/m   RA Area:     11.40 cm LA Vol (A2C):   37.1 ml 24.08 ml/m  RA Volume:   22.90 ml  14.86 ml/m LA Vol (A4C):   40.0 ml 25.96 ml/m LA Biplane Vol: 42.9 ml 27.85 ml/m  AORTIC VALVE LVOT Vmax:   104.00 cm/s LVOT Vmean:  69.000 cm/s LVOT VTI:    0.223 m  AORTA Ao Root diam: 2.90 cm TRICUSPID VALVE TR Peak grad:   30.7 mmHg TR Vmax:        277.00 cm/s  SHUNTS Systemic VTI:  0.22 m Systemic Diam: 1.70 cm Vishnu Priya Mallipeddi Electronically signed by Winfield Rast Mallipeddi Signature Date/Time: 04/23/2023/12:06:43 PM    Final    MR BRAIN WO CONTRAST  Result Date: 04/22/2023 CLINICAL DATA:  Initial evaluation for neuro deficit, stroke. EXAM: MRI HEAD WITHOUT CONTRAST TECHNIQUE: Multiplanar, multiecho pulse sequences of the brain  and surrounding structures were obtained without intravenous contrast. COMPARISON:  CT from earlier the same day. FINDINGS: Brain: Examination moderately degraded by motion artifact. Generalized age-related cerebral  atrophy with moderately advanced chronic microvascular ischemic disease. Multiple remote lacunar infarcts present about the bilateral basal ganglia and thalami. Few scattered small remote bilateral cerebellar infarcts noted. Chronic cortical infarcts involving the high right frontoparietal region noted. Additional small remote posterior left frontal cortical infarct. Restricted diffusion involving the left frontal operculum and superior insula, consistent with acute left MCA distribution infarct (series 2, image 27). No associated hemorrhage or mass effect. No other evidence for acute or subacute ischemia. Gray-white matter differentiation otherwise maintained. No visible acute or chronic intracranial blood products. No mass lesion or midline shift. No hydrocephalus or extra-axial fluid collection. Pituitary gland and suprasellar region within normal limits. Vascular: Major intracranial vascular flow voids are maintained. Skull and upper cervical spine: Atlantoaxial assimilation with flattening of the occipital condyles noted. Congenital segmental fusion of C5 and C6. Bone marrow signal intensity normal. No scalp soft tissue abnormality. Sinuses/Orbits: Left gaze preference. Prior bilateral ocular lens replacement. Paranasal sinuses are clear. Trace left mastoid effusion, of doubtful significance. Other: None. IMPRESSION: 1. Acute left MCA distribution infarct involving the left frontal operculum and superior insula. No associated hemorrhage or mass effect. 2. Underlying age-related cerebral atrophy with moderately advanced chronic microvascular ischemic disease, with multiple remote infarcts as above. Electronically Signed   By: Rise Mu M.D.   On: 04/22/2023 22:49   CT ANGIO HEAD NECK W WO CM W PERF (CODE STROKE)  Result Date: 04/22/2023 CLINICAL DATA:  Stroke suspected EXAM: CT ANGIOGRAPHY HEAD AND NECK TECHNIQUE: Multidetector CT imaging of the head and neck was performed using the standard  protocol during bolus administration of intravenous contrast. Multiplanar CT image reconstructions and MIPs were obtained to evaluate the vascular anatomy. Carotid stenosis measurements (when applicable) are obtained utilizing NASCET criteria, using the distal internal carotid diameter as the denominator. RADIATION DOSE REDUCTION: This exam was performed according to the departmental dose-optimization program which includes automated exposure control, adjustment of the mA and/or kV according to patient size and/or use of iterative reconstruction technique. CONTRAST:  OMNIPAQUE IOHEXOL 350 MG/ML SOLN COMPARISON:  No prior CTA available, correlation is made with CT head 04/22/2023 and MRA head 05/16/2008 FINDINGS: CT HEAD FINDINGS For noncontrast findings, please see same day CT head. 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 The left vertebral artery also originates from the aorta. Mild aortic atherosclerosis. No evidence of aortic dissection or aneurysm. Right carotid system: No evidence of dissection, occlusion, or hemodynamically significant stenosis (greater than 50%). Left carotid system: Peripheral filling defect in the distal left common carotid (series 5, image 232), concerning for luminal thrombus, which does not extend past the bifurcation. No evidence of hemodynamically significant stenosis, dissection or occlusion. Vertebral arteries: No evidence of dissection, occlusion, or hemodynamically significant stenosis (greater than 50%). Skeleton: No acute osseous abnormality. Severe degenerative changes in the cervical spine. Other neck: Multinodular goiter, with the largest discrete nodule measuring up to 2.0 cm (series 5, image 140). Upper chest: No focal pulmonary opacity or pleural effusion. Review of the MIP images confirms the  above findings CTA HEAD FINDINGS Anterior circulation: Both internal carotid arteries are patent to the termini, with mild stenosis in the right cavernous and  left supraclinoid ICA A1 segments patent. Normal anterior communicating artery. Anterior cerebral arteries are patent to their distal aspects without significant stenosis. No M1 stenosis or occlusion. MCA branches perfused to their distal aspects without significant stenosis. Posterior circulation: Vertebral arteries patent to the vertebrobasilar junction without significant stenosis. Posterior inferior cerebellar arteries patent proximally. Basilar patent to its distal aspect without significant stenosis. Superior cerebellar arteries patent proximally. Patent P1 segments, hypoplastic on the right. Near fetal origin of the right PCA from the right posterior communicating artery. PCAs perfused to their distal aspects without significant stenosis. The left posterior communicating artery is not visualized. Venous sinuses: As permitted by contrast timing, patent. Anatomic variants: None significant. Review of the MIP images confirms the above findings CT Brain Perfusion Findings: ASPECTS: 10 CBF (<30%) Volume: 0mL Perfusion (Tmax>6.0s) volume: 29mL Mismatch Volume: 29mL Infarction Location:No infarct core. Areas of decreased perfusion are noted in multiple vascular territories, favored to be artifactual. IMPRESSION: 1. Peripheral filling defect in the distal left common carotid, concerning for luminal thrombus. 2. No intracranial large vessel occlusion. Mild stenosis in the right cavernous and left supraclinoid ICA. 3. No hemodynamically significant stenosis in the neck. 4. No infarct core. Areas of decreased perfusion are noted in multiple vascular territories, favored to be artifactual, possibly related to motion. 5. Multinodular goiter, with the largest discrete nodule measuring up to 2.0 cm. If this has not previously been evaluated, a non-emergent ultrasound  of the thyroid is recommended. (Reference: J Am Coll Radiol. 2015 Feb;12(2): 143-50) 6. Incidental note is made of variant aortic anatomy, with an aortic ring composed of an aberrant origin of the right subclavian, which joins with the normal origin of the right subclavian. The left vertebral artery also originates from the aorta. Imaging results were communicated on 04/22/2023 at 5:36 pm to provider Dr. Amada Jupiter via secure text paging. Electronically Signed   By: Wiliam Ke M.D.   On: 04/22/2023 17:40   CT HEAD CODE STROKE WO CONTRAST  Result Date: 04/22/2023 CLINICAL DATA:  Code stroke.  Stroke suspected EXAM: CT HEAD WITHOUT CONTRAST TECHNIQUE: Contiguous axial images were obtained from the base of the skull through the vertex without intravenous contrast. RADIATION DOSE REDUCTION: This exam was performed according to the departmental dose-optimization program which includes automated exposure control, adjustment of the mA and/or kV according to patient size and/or use of iterative reconstruction technique. COMPARISON:  05/04/2020 FINDINGS: Brain: No evidence of acute infarction, hemorrhage, mass, mass effect, or midline shift. No hydrocephalus or extra-axial collection. Periventricular white matter changes, likely the sequela of chronic small vessel ischemic disease. Remote lacunar infarcts in the basal ganglia, thalami, and cerebellum. Vascular: No definite hyperdense vessel. There is increased density at the left ICA terminus and in the distal basilar artery, which appears unchanged from the prior exam. Atherosclerotic calcifications in the intracranial carotid and vertebral arteries. Skull: Negative for fracture or focal lesion. Sinuses/Orbits: No acute finding. Status post bilateral lens replacements. Other: None. ASPECTS [facility] Stroke Program Early CT Score) - Ganglionic level infarction (caudate, lentiform nuclei, internal capsule, insula, M1-M3 cortex): 7 - Supraganglionic infarction (M4-M6  cortex): 3 Total score (0-10 with 10 being normal): 10 IMPRESSION: 1. No acute intracranial process. 2. ASPECTS is 10. Imaging results were communicated on 04/22/2023 at 5:13 pm to provider Dr. Amada Jupiter via secure text paging. Electronically Signed   By: Wiliam Ke M.D.   On: 04/22/2023 17:13    Microbiology: Results for orders placed or performed during the hospital encounter of 05/04/20  SARS Coronavirus 2 by RT PCR (hospital order, performed in [facility] hospital lab) Nasopharyngeal Nasopharyngeal Swab     Status: None   Collection Time: 05/04/20  4:33 PM   Specimen: Nasopharyngeal Swab  Result Value Ref Range Status   SARS Coronavirus 2 NEGATIVE NEGATIVE Final    Comment: (NOTE) SARS-CoV-2 target nucleic acids are NOT DETECTED.  The SARS-CoV-2 RNA is generally detectable in upper and lower respiratory specimens during the acute phase of infection. The lowest concentration of SARS-CoV-2 viral copies this assay can detect is 250 copies / mL. A negative result does not preclude SARS-CoV-2 infection and should not be used as the sole basis for treatment or other patient management decisions.  A negative result may occur with improper specimen collection / handling, submission of specimen other than nasopharyngeal swab, presence of viral mutation(s) within the areas targeted by this assay, and inadequate number of viral copies (<250 copies / mL). A negative result must be combined with clinical observations, patient history, and epidemiological information.  Fact Sheet for Patients:   BoilerBrush.com.cy  Fact Sheet for Healthcare Providers: https://pope.com/  This test is not yet approved or  cleared by the [place] FDA and has been authorized for detection and/or diagnosis of SARS-CoV-2 by FDA under an Emergency Use Authorization (EUA).  This EUA will remain in effect (meaning this test can be used) for the duration of  the COVID-19 declaration under Section 564(b)(1) of the Act, 21 U.S.C. section 360bbb-3(b)(1), unless the authorization is terminated or revoked sooner.  Performed at [facility], [address], [place], [place] [address]    Labs: CBC: Recent Labs  Lab 04/22/23 1651 04/22/23 1710  WBC 9.5  --   NEUTROABS 7.3  --   HGB 13.6 14.6  HCT 42.8 43.0  MCV 92.6  --   PLT 187  --    Basic Metabolic Panel: Recent Labs  Lab 04/22/23 1651 04/22/23 1710  NA 139 141  K 4.0 3.9  CL 107 109  CO2 20*  --   GLUCOSE 176* 179*  BUN 17 18  CREATININE 0.81 0.70  CALCIUM 9.7  --    Liver Function Tests: Recent Labs  Lab 04/22/23 1651  AST 22  ALT 24  ALKPHOS 88  BILITOT 0.2*  PROT 7.6  ALBUMIN 3.8   CBG: Recent Labs  Lab 04/22/23 1702  GLUCAP 180*    Discharge time spent: greater than 30 minutes.  Author: Lynden Oxford, MD  [facility] 04/24/2023

## 2023-04-26 DIAGNOSIS — Z7901 Long term (current) use of anticoagulants: Secondary | ICD-10-CM | POA: Diagnosis not present

## 2023-04-29 DIAGNOSIS — Z7901 Long term (current) use of anticoagulants: Secondary | ICD-10-CM | POA: Diagnosis not present

## 2023-04-29 DIAGNOSIS — I10 Essential (primary) hypertension: Secondary | ICD-10-CM | POA: Diagnosis not present

## 2023-04-29 DIAGNOSIS — Z0000 Encounter for general adult medical examination without abnormal findings: Secondary | ICD-10-CM | POA: Diagnosis not present

## 2023-05-04 DIAGNOSIS — I69391 Dysphagia following cerebral infarction: Secondary | ICD-10-CM | POA: Diagnosis not present

## 2023-05-04 DIAGNOSIS — I69351 Hemiplegia and hemiparesis following cerebral infarction affecting right dominant side: Secondary | ICD-10-CM | POA: Diagnosis not present

## 2023-05-04 DIAGNOSIS — Z9181 History of falling: Secondary | ICD-10-CM | POA: Diagnosis not present

## 2023-05-04 DIAGNOSIS — Z8673 Personal history of transient ischemic attack (TIA), and cerebral infarction without residual deficits: Secondary | ICD-10-CM | POA: Diagnosis not present

## 2023-05-04 DIAGNOSIS — I10 Essential (primary) hypertension: Secondary | ICD-10-CM | POA: Diagnosis not present

## 2023-05-04 DIAGNOSIS — D6862 Lupus anticoagulant syndrome: Secondary | ICD-10-CM | POA: Diagnosis not present

## 2023-05-07 DIAGNOSIS — I69391 Dysphagia following cerebral infarction: Secondary | ICD-10-CM | POA: Diagnosis not present

## 2023-05-07 DIAGNOSIS — E1122 Type 2 diabetes mellitus with diabetic chronic kidney disease: Secondary | ICD-10-CM | POA: Diagnosis not present

## 2023-05-07 DIAGNOSIS — I129 Hypertensive chronic kidney disease with stage 1 through stage 4 chronic kidney disease, or unspecified chronic kidney disease: Secondary | ICD-10-CM | POA: Diagnosis not present

## 2023-05-07 DIAGNOSIS — I69322 Dysarthria following cerebral infarction: Secondary | ICD-10-CM | POA: Diagnosis not present

## 2023-05-07 DIAGNOSIS — L719 Rosacea, unspecified: Secondary | ICD-10-CM | POA: Diagnosis not present

## 2023-05-07 DIAGNOSIS — D6862 Lupus anticoagulant syndrome: Secondary | ICD-10-CM | POA: Diagnosis not present

## 2023-05-07 DIAGNOSIS — I700 Atherosclerosis of aorta: Secondary | ICD-10-CM | POA: Diagnosis not present

## 2023-05-07 DIAGNOSIS — I69351 Hemiplegia and hemiparesis following cerebral infarction affecting right dominant side: Secondary | ICD-10-CM | POA: Diagnosis not present

## 2023-05-07 DIAGNOSIS — M810 Age-related osteoporosis without current pathological fracture: Secondary | ICD-10-CM | POA: Diagnosis not present

## 2023-05-07 DIAGNOSIS — G8190 Hemiplegia, unspecified affecting unspecified side: Secondary | ICD-10-CM | POA: Diagnosis not present

## 2023-05-07 DIAGNOSIS — N1831 Chronic kidney disease, stage 3a: Secondary | ICD-10-CM | POA: Diagnosis not present

## 2023-05-09 DIAGNOSIS — I700 Atherosclerosis of aorta: Secondary | ICD-10-CM | POA: Diagnosis not present

## 2023-05-09 DIAGNOSIS — M810 Age-related osteoporosis without current pathological fracture: Secondary | ICD-10-CM | POA: Diagnosis not present

## 2023-05-09 DIAGNOSIS — D6862 Lupus anticoagulant syndrome: Secondary | ICD-10-CM | POA: Diagnosis not present

## 2023-05-09 DIAGNOSIS — N1831 Chronic kidney disease, stage 3a: Secondary | ICD-10-CM | POA: Diagnosis not present

## 2023-05-09 DIAGNOSIS — L719 Rosacea, unspecified: Secondary | ICD-10-CM | POA: Diagnosis not present

## 2023-05-09 DIAGNOSIS — I69391 Dysphagia following cerebral infarction: Secondary | ICD-10-CM | POA: Diagnosis not present

## 2023-05-09 DIAGNOSIS — I69351 Hemiplegia and hemiparesis following cerebral infarction affecting right dominant side: Secondary | ICD-10-CM | POA: Diagnosis not present

## 2023-05-09 DIAGNOSIS — E1122 Type 2 diabetes mellitus with diabetic chronic kidney disease: Secondary | ICD-10-CM | POA: Diagnosis not present

## 2023-05-09 DIAGNOSIS — I129 Hypertensive chronic kidney disease with stage 1 through stage 4 chronic kidney disease, or unspecified chronic kidney disease: Secondary | ICD-10-CM | POA: Diagnosis not present

## 2023-05-09 DIAGNOSIS — I69322 Dysarthria following cerebral infarction: Secondary | ICD-10-CM | POA: Diagnosis not present

## 2023-05-10 DIAGNOSIS — M810 Age-related osteoporosis without current pathological fracture: Secondary | ICD-10-CM | POA: Diagnosis not present

## 2023-05-10 DIAGNOSIS — I69322 Dysarthria following cerebral infarction: Secondary | ICD-10-CM | POA: Diagnosis not present

## 2023-05-10 DIAGNOSIS — N1831 Chronic kidney disease, stage 3a: Secondary | ICD-10-CM | POA: Diagnosis not present

## 2023-05-10 DIAGNOSIS — D6862 Lupus anticoagulant syndrome: Secondary | ICD-10-CM | POA: Diagnosis not present

## 2023-05-10 DIAGNOSIS — L719 Rosacea, unspecified: Secondary | ICD-10-CM | POA: Diagnosis not present

## 2023-05-10 DIAGNOSIS — E1122 Type 2 diabetes mellitus with diabetic chronic kidney disease: Secondary | ICD-10-CM | POA: Diagnosis not present

## 2023-05-10 DIAGNOSIS — I129 Hypertensive chronic kidney disease with stage 1 through stage 4 chronic kidney disease, or unspecified chronic kidney disease: Secondary | ICD-10-CM | POA: Diagnosis not present

## 2023-05-10 DIAGNOSIS — I700 Atherosclerosis of aorta: Secondary | ICD-10-CM | POA: Diagnosis not present

## 2023-05-10 DIAGNOSIS — I69391 Dysphagia following cerebral infarction: Secondary | ICD-10-CM | POA: Diagnosis not present

## 2023-05-10 DIAGNOSIS — I69351 Hemiplegia and hemiparesis following cerebral infarction affecting right dominant side: Secondary | ICD-10-CM | POA: Diagnosis not present

## 2023-05-12 DIAGNOSIS — I69351 Hemiplegia and hemiparesis following cerebral infarction affecting right dominant side: Secondary | ICD-10-CM | POA: Diagnosis not present

## 2023-05-12 DIAGNOSIS — E1122 Type 2 diabetes mellitus with diabetic chronic kidney disease: Secondary | ICD-10-CM | POA: Diagnosis not present

## 2023-05-12 DIAGNOSIS — I69391 Dysphagia following cerebral infarction: Secondary | ICD-10-CM | POA: Diagnosis not present

## 2023-05-12 DIAGNOSIS — D6862 Lupus anticoagulant syndrome: Secondary | ICD-10-CM | POA: Diagnosis not present

## 2023-05-12 DIAGNOSIS — I700 Atherosclerosis of aorta: Secondary | ICD-10-CM | POA: Diagnosis not present

## 2023-05-12 DIAGNOSIS — N1831 Chronic kidney disease, stage 3a: Secondary | ICD-10-CM | POA: Diagnosis not present

## 2023-05-12 DIAGNOSIS — L719 Rosacea, unspecified: Secondary | ICD-10-CM | POA: Diagnosis not present

## 2023-05-12 DIAGNOSIS — M810 Age-related osteoporosis without current pathological fracture: Secondary | ICD-10-CM | POA: Diagnosis not present

## 2023-05-12 DIAGNOSIS — I129 Hypertensive chronic kidney disease with stage 1 through stage 4 chronic kidney disease, or unspecified chronic kidney disease: Secondary | ICD-10-CM | POA: Diagnosis not present

## 2023-05-12 DIAGNOSIS — I69322 Dysarthria following cerebral infarction: Secondary | ICD-10-CM | POA: Diagnosis not present

## 2023-05-16 DIAGNOSIS — I69391 Dysphagia following cerebral infarction: Secondary | ICD-10-CM | POA: Diagnosis not present

## 2023-05-16 DIAGNOSIS — I700 Atherosclerosis of aorta: Secondary | ICD-10-CM | POA: Diagnosis not present

## 2023-05-16 DIAGNOSIS — I129 Hypertensive chronic kidney disease with stage 1 through stage 4 chronic kidney disease, or unspecified chronic kidney disease: Secondary | ICD-10-CM | POA: Diagnosis not present

## 2023-05-16 DIAGNOSIS — M810 Age-related osteoporosis without current pathological fracture: Secondary | ICD-10-CM | POA: Diagnosis not present

## 2023-05-16 DIAGNOSIS — E1122 Type 2 diabetes mellitus with diabetic chronic kidney disease: Secondary | ICD-10-CM | POA: Diagnosis not present

## 2023-05-16 DIAGNOSIS — I69322 Dysarthria following cerebral infarction: Secondary | ICD-10-CM | POA: Diagnosis not present

## 2023-05-16 DIAGNOSIS — I69351 Hemiplegia and hemiparesis following cerebral infarction affecting right dominant side: Secondary | ICD-10-CM | POA: Diagnosis not present

## 2023-05-16 DIAGNOSIS — D6862 Lupus anticoagulant syndrome: Secondary | ICD-10-CM | POA: Diagnosis not present

## 2023-05-16 DIAGNOSIS — N1831 Chronic kidney disease, stage 3a: Secondary | ICD-10-CM | POA: Diagnosis not present

## 2023-05-16 DIAGNOSIS — L719 Rosacea, unspecified: Secondary | ICD-10-CM | POA: Diagnosis not present

## 2023-05-17 DIAGNOSIS — N1831 Chronic kidney disease, stage 3a: Secondary | ICD-10-CM | POA: Diagnosis not present

## 2023-05-17 DIAGNOSIS — I69351 Hemiplegia and hemiparesis following cerebral infarction affecting right dominant side: Secondary | ICD-10-CM | POA: Diagnosis not present

## 2023-05-17 DIAGNOSIS — I700 Atherosclerosis of aorta: Secondary | ICD-10-CM | POA: Diagnosis not present

## 2023-05-17 DIAGNOSIS — I69322 Dysarthria following cerebral infarction: Secondary | ICD-10-CM | POA: Diagnosis not present

## 2023-05-17 DIAGNOSIS — E1122 Type 2 diabetes mellitus with diabetic chronic kidney disease: Secondary | ICD-10-CM | POA: Diagnosis not present

## 2023-05-17 DIAGNOSIS — I129 Hypertensive chronic kidney disease with stage 1 through stage 4 chronic kidney disease, or unspecified chronic kidney disease: Secondary | ICD-10-CM | POA: Diagnosis not present

## 2023-05-17 DIAGNOSIS — I69391 Dysphagia following cerebral infarction: Secondary | ICD-10-CM | POA: Diagnosis not present

## 2023-05-17 DIAGNOSIS — L719 Rosacea, unspecified: Secondary | ICD-10-CM | POA: Diagnosis not present

## 2023-05-17 DIAGNOSIS — D6862 Lupus anticoagulant syndrome: Secondary | ICD-10-CM | POA: Diagnosis not present

## 2023-05-17 DIAGNOSIS — M810 Age-related osteoporosis without current pathological fracture: Secondary | ICD-10-CM | POA: Diagnosis not present

## 2023-05-18 DIAGNOSIS — E1122 Type 2 diabetes mellitus with diabetic chronic kidney disease: Secondary | ICD-10-CM | POA: Diagnosis not present

## 2023-05-18 DIAGNOSIS — I69322 Dysarthria following cerebral infarction: Secondary | ICD-10-CM | POA: Diagnosis not present

## 2023-05-18 DIAGNOSIS — M810 Age-related osteoporosis without current pathological fracture: Secondary | ICD-10-CM | POA: Diagnosis not present

## 2023-05-18 DIAGNOSIS — N1831 Chronic kidney disease, stage 3a: Secondary | ICD-10-CM | POA: Diagnosis not present

## 2023-05-18 DIAGNOSIS — D6862 Lupus anticoagulant syndrome: Secondary | ICD-10-CM | POA: Diagnosis not present

## 2023-05-18 DIAGNOSIS — I700 Atherosclerosis of aorta: Secondary | ICD-10-CM | POA: Diagnosis not present

## 2023-05-18 DIAGNOSIS — I129 Hypertensive chronic kidney disease with stage 1 through stage 4 chronic kidney disease, or unspecified chronic kidney disease: Secondary | ICD-10-CM | POA: Diagnosis not present

## 2023-05-18 DIAGNOSIS — I69351 Hemiplegia and hemiparesis following cerebral infarction affecting right dominant side: Secondary | ICD-10-CM | POA: Diagnosis not present

## 2023-05-18 DIAGNOSIS — L719 Rosacea, unspecified: Secondary | ICD-10-CM | POA: Diagnosis not present

## 2023-05-18 DIAGNOSIS — I69391 Dysphagia following cerebral infarction: Secondary | ICD-10-CM | POA: Diagnosis not present

## 2023-05-19 DIAGNOSIS — N1831 Chronic kidney disease, stage 3a: Secondary | ICD-10-CM | POA: Diagnosis not present

## 2023-05-19 DIAGNOSIS — E1122 Type 2 diabetes mellitus with diabetic chronic kidney disease: Secondary | ICD-10-CM | POA: Diagnosis not present

## 2023-05-19 DIAGNOSIS — I69391 Dysphagia following cerebral infarction: Secondary | ICD-10-CM | POA: Diagnosis not present

## 2023-05-19 DIAGNOSIS — D6862 Lupus anticoagulant syndrome: Secondary | ICD-10-CM | POA: Diagnosis not present

## 2023-05-19 DIAGNOSIS — I700 Atherosclerosis of aorta: Secondary | ICD-10-CM | POA: Diagnosis not present

## 2023-05-19 DIAGNOSIS — I69351 Hemiplegia and hemiparesis following cerebral infarction affecting right dominant side: Secondary | ICD-10-CM | POA: Diagnosis not present

## 2023-05-19 DIAGNOSIS — I69322 Dysarthria following cerebral infarction: Secondary | ICD-10-CM | POA: Diagnosis not present

## 2023-05-19 DIAGNOSIS — M810 Age-related osteoporosis without current pathological fracture: Secondary | ICD-10-CM | POA: Diagnosis not present

## 2023-05-19 DIAGNOSIS — I129 Hypertensive chronic kidney disease with stage 1 through stage 4 chronic kidney disease, or unspecified chronic kidney disease: Secondary | ICD-10-CM | POA: Diagnosis not present

## 2023-05-19 DIAGNOSIS — L719 Rosacea, unspecified: Secondary | ICD-10-CM | POA: Diagnosis not present

## 2023-05-23 DIAGNOSIS — D6862 Lupus anticoagulant syndrome: Secondary | ICD-10-CM | POA: Diagnosis not present

## 2023-05-23 DIAGNOSIS — N1831 Chronic kidney disease, stage 3a: Secondary | ICD-10-CM | POA: Diagnosis not present

## 2023-05-23 DIAGNOSIS — E1122 Type 2 diabetes mellitus with diabetic chronic kidney disease: Secondary | ICD-10-CM | POA: Diagnosis not present

## 2023-05-23 DIAGNOSIS — I69351 Hemiplegia and hemiparesis following cerebral infarction affecting right dominant side: Secondary | ICD-10-CM | POA: Diagnosis not present

## 2023-05-23 DIAGNOSIS — I69322 Dysarthria following cerebral infarction: Secondary | ICD-10-CM | POA: Diagnosis not present

## 2023-05-23 DIAGNOSIS — I700 Atherosclerosis of aorta: Secondary | ICD-10-CM | POA: Diagnosis not present

## 2023-05-23 DIAGNOSIS — M810 Age-related osteoporosis without current pathological fracture: Secondary | ICD-10-CM | POA: Diagnosis not present

## 2023-05-23 DIAGNOSIS — L719 Rosacea, unspecified: Secondary | ICD-10-CM | POA: Diagnosis not present

## 2023-05-23 DIAGNOSIS — I129 Hypertensive chronic kidney disease with stage 1 through stage 4 chronic kidney disease, or unspecified chronic kidney disease: Secondary | ICD-10-CM | POA: Diagnosis not present

## 2023-05-23 DIAGNOSIS — I69391 Dysphagia following cerebral infarction: Secondary | ICD-10-CM | POA: Diagnosis not present

## 2023-05-25 DIAGNOSIS — I700 Atherosclerosis of aorta: Secondary | ICD-10-CM | POA: Diagnosis not present

## 2023-05-25 DIAGNOSIS — I69391 Dysphagia following cerebral infarction: Secondary | ICD-10-CM | POA: Diagnosis not present

## 2023-05-25 DIAGNOSIS — M810 Age-related osteoporosis without current pathological fracture: Secondary | ICD-10-CM | POA: Diagnosis not present

## 2023-05-25 DIAGNOSIS — I69322 Dysarthria following cerebral infarction: Secondary | ICD-10-CM | POA: Diagnosis not present

## 2023-05-25 DIAGNOSIS — D6862 Lupus anticoagulant syndrome: Secondary | ICD-10-CM | POA: Diagnosis not present

## 2023-05-25 DIAGNOSIS — E1122 Type 2 diabetes mellitus with diabetic chronic kidney disease: Secondary | ICD-10-CM | POA: Diagnosis not present

## 2023-05-25 DIAGNOSIS — N1831 Chronic kidney disease, stage 3a: Secondary | ICD-10-CM | POA: Diagnosis not present

## 2023-05-25 DIAGNOSIS — L719 Rosacea, unspecified: Secondary | ICD-10-CM | POA: Diagnosis not present

## 2023-05-25 DIAGNOSIS — I129 Hypertensive chronic kidney disease with stage 1 through stage 4 chronic kidney disease, or unspecified chronic kidney disease: Secondary | ICD-10-CM | POA: Diagnosis not present

## 2023-05-25 DIAGNOSIS — I69351 Hemiplegia and hemiparesis following cerebral infarction affecting right dominant side: Secondary | ICD-10-CM | POA: Diagnosis not present

## 2023-05-26 DIAGNOSIS — D6862 Lupus anticoagulant syndrome: Secondary | ICD-10-CM | POA: Diagnosis not present

## 2023-05-26 DIAGNOSIS — I69391 Dysphagia following cerebral infarction: Secondary | ICD-10-CM | POA: Diagnosis not present

## 2023-05-26 DIAGNOSIS — N1831 Chronic kidney disease, stage 3a: Secondary | ICD-10-CM | POA: Diagnosis not present

## 2023-05-26 DIAGNOSIS — M810 Age-related osteoporosis without current pathological fracture: Secondary | ICD-10-CM | POA: Diagnosis not present

## 2023-05-26 DIAGNOSIS — I700 Atherosclerosis of aorta: Secondary | ICD-10-CM | POA: Diagnosis not present

## 2023-05-26 DIAGNOSIS — E1122 Type 2 diabetes mellitus with diabetic chronic kidney disease: Secondary | ICD-10-CM | POA: Diagnosis not present

## 2023-05-26 DIAGNOSIS — L719 Rosacea, unspecified: Secondary | ICD-10-CM | POA: Diagnosis not present

## 2023-05-26 DIAGNOSIS — I69322 Dysarthria following cerebral infarction: Secondary | ICD-10-CM | POA: Diagnosis not present

## 2023-05-26 DIAGNOSIS — I69351 Hemiplegia and hemiparesis following cerebral infarction affecting right dominant side: Secondary | ICD-10-CM | POA: Diagnosis not present

## 2023-05-26 DIAGNOSIS — I129 Hypertensive chronic kidney disease with stage 1 through stage 4 chronic kidney disease, or unspecified chronic kidney disease: Secondary | ICD-10-CM | POA: Diagnosis not present

## 2023-05-31 DIAGNOSIS — D6862 Lupus anticoagulant syndrome: Secondary | ICD-10-CM | POA: Diagnosis not present

## 2023-05-31 DIAGNOSIS — I10 Essential (primary) hypertension: Secondary | ICD-10-CM | POA: Diagnosis not present

## 2023-05-31 DIAGNOSIS — Z8673 Personal history of transient ischemic attack (TIA), and cerebral infarction without residual deficits: Secondary | ICD-10-CM | POA: Diagnosis not present

## 2023-06-01 DIAGNOSIS — I69391 Dysphagia following cerebral infarction: Secondary | ICD-10-CM | POA: Diagnosis not present

## 2023-06-01 DIAGNOSIS — M810 Age-related osteoporosis without current pathological fracture: Secondary | ICD-10-CM | POA: Diagnosis not present

## 2023-06-01 DIAGNOSIS — N1831 Chronic kidney disease, stage 3a: Secondary | ICD-10-CM | POA: Diagnosis not present

## 2023-06-01 DIAGNOSIS — L719 Rosacea, unspecified: Secondary | ICD-10-CM | POA: Diagnosis not present

## 2023-06-01 DIAGNOSIS — I129 Hypertensive chronic kidney disease with stage 1 through stage 4 chronic kidney disease, or unspecified chronic kidney disease: Secondary | ICD-10-CM | POA: Diagnosis not present

## 2023-06-01 DIAGNOSIS — I69322 Dysarthria following cerebral infarction: Secondary | ICD-10-CM | POA: Diagnosis not present

## 2023-06-01 DIAGNOSIS — D6862 Lupus anticoagulant syndrome: Secondary | ICD-10-CM | POA: Diagnosis not present

## 2023-06-01 DIAGNOSIS — I700 Atherosclerosis of aorta: Secondary | ICD-10-CM | POA: Diagnosis not present

## 2023-06-01 DIAGNOSIS — I69351 Hemiplegia and hemiparesis following cerebral infarction affecting right dominant side: Secondary | ICD-10-CM | POA: Diagnosis not present

## 2023-06-01 DIAGNOSIS — E1122 Type 2 diabetes mellitus with diabetic chronic kidney disease: Secondary | ICD-10-CM | POA: Diagnosis not present

## 2023-06-02 DIAGNOSIS — N1831 Chronic kidney disease, stage 3a: Secondary | ICD-10-CM | POA: Diagnosis not present

## 2023-06-02 DIAGNOSIS — I700 Atherosclerosis of aorta: Secondary | ICD-10-CM | POA: Diagnosis not present

## 2023-06-02 DIAGNOSIS — I129 Hypertensive chronic kidney disease with stage 1 through stage 4 chronic kidney disease, or unspecified chronic kidney disease: Secondary | ICD-10-CM | POA: Diagnosis not present

## 2023-06-02 DIAGNOSIS — L719 Rosacea, unspecified: Secondary | ICD-10-CM | POA: Diagnosis not present

## 2023-06-02 DIAGNOSIS — M810 Age-related osteoporosis without current pathological fracture: Secondary | ICD-10-CM | POA: Diagnosis not present

## 2023-06-02 DIAGNOSIS — E1122 Type 2 diabetes mellitus with diabetic chronic kidney disease: Secondary | ICD-10-CM | POA: Diagnosis not present

## 2023-06-02 DIAGNOSIS — D6862 Lupus anticoagulant syndrome: Secondary | ICD-10-CM | POA: Diagnosis not present

## 2023-06-02 DIAGNOSIS — I69322 Dysarthria following cerebral infarction: Secondary | ICD-10-CM | POA: Diagnosis not present

## 2023-06-02 DIAGNOSIS — I69351 Hemiplegia and hemiparesis following cerebral infarction affecting right dominant side: Secondary | ICD-10-CM | POA: Diagnosis not present

## 2023-06-02 DIAGNOSIS — I69391 Dysphagia following cerebral infarction: Secondary | ICD-10-CM | POA: Diagnosis not present

## 2023-06-06 DIAGNOSIS — D6862 Lupus anticoagulant syndrome: Secondary | ICD-10-CM | POA: Diagnosis not present

## 2023-06-07 DIAGNOSIS — I69351 Hemiplegia and hemiparesis following cerebral infarction affecting right dominant side: Secondary | ICD-10-CM | POA: Diagnosis not present

## 2023-06-07 DIAGNOSIS — D6862 Lupus anticoagulant syndrome: Secondary | ICD-10-CM | POA: Diagnosis not present

## 2023-06-07 DIAGNOSIS — I129 Hypertensive chronic kidney disease with stage 1 through stage 4 chronic kidney disease, or unspecified chronic kidney disease: Secondary | ICD-10-CM | POA: Diagnosis not present

## 2023-06-07 DIAGNOSIS — E1122 Type 2 diabetes mellitus with diabetic chronic kidney disease: Secondary | ICD-10-CM | POA: Diagnosis not present

## 2023-06-07 DIAGNOSIS — I69391 Dysphagia following cerebral infarction: Secondary | ICD-10-CM | POA: Diagnosis not present

## 2023-06-07 DIAGNOSIS — I69322 Dysarthria following cerebral infarction: Secondary | ICD-10-CM | POA: Diagnosis not present

## 2023-06-07 DIAGNOSIS — I700 Atherosclerosis of aorta: Secondary | ICD-10-CM | POA: Diagnosis not present

## 2023-06-07 DIAGNOSIS — L719 Rosacea, unspecified: Secondary | ICD-10-CM | POA: Diagnosis not present

## 2023-06-07 DIAGNOSIS — N1831 Chronic kidney disease, stage 3a: Secondary | ICD-10-CM | POA: Diagnosis not present

## 2023-06-07 DIAGNOSIS — M810 Age-related osteoporosis without current pathological fracture: Secondary | ICD-10-CM | POA: Diagnosis not present

## 2023-06-14 DIAGNOSIS — M810 Age-related osteoporosis without current pathological fracture: Secondary | ICD-10-CM | POA: Diagnosis not present

## 2023-06-14 DIAGNOSIS — I69322 Dysarthria following cerebral infarction: Secondary | ICD-10-CM | POA: Diagnosis not present

## 2023-06-14 DIAGNOSIS — I700 Atherosclerosis of aorta: Secondary | ICD-10-CM | POA: Diagnosis not present

## 2023-06-14 DIAGNOSIS — N1831 Chronic kidney disease, stage 3a: Secondary | ICD-10-CM | POA: Diagnosis not present

## 2023-06-14 DIAGNOSIS — I69391 Dysphagia following cerebral infarction: Secondary | ICD-10-CM | POA: Diagnosis not present

## 2023-06-14 DIAGNOSIS — D6862 Lupus anticoagulant syndrome: Secondary | ICD-10-CM | POA: Diagnosis not present

## 2023-06-14 DIAGNOSIS — E1122 Type 2 diabetes mellitus with diabetic chronic kidney disease: Secondary | ICD-10-CM | POA: Diagnosis not present

## 2023-06-14 DIAGNOSIS — L719 Rosacea, unspecified: Secondary | ICD-10-CM | POA: Diagnosis not present

## 2023-06-14 DIAGNOSIS — I69351 Hemiplegia and hemiparesis following cerebral infarction affecting right dominant side: Secondary | ICD-10-CM | POA: Diagnosis not present

## 2023-06-14 DIAGNOSIS — I129 Hypertensive chronic kidney disease with stage 1 through stage 4 chronic kidney disease, or unspecified chronic kidney disease: Secondary | ICD-10-CM | POA: Diagnosis not present

## 2023-06-20 ENCOUNTER — Encounter: Payer: Self-pay | Admitting: Adult Health

## 2023-06-20 ENCOUNTER — Ambulatory Visit: Payer: Medicare HMO | Admitting: Adult Health

## 2023-06-20 VITALS — BP 120/64 | Ht <= 58 in | Wt 112.0 lb

## 2023-06-20 DIAGNOSIS — I63412 Cerebral infarction due to embolism of left middle cerebral artery: Secondary | ICD-10-CM

## 2023-06-20 NOTE — Progress Notes (Signed)
I agree with the above plan 

## 2023-06-20 NOTE — Patient Instructions (Addendum)
Continue to work with home health therapies - consider working with outpatient therapies once completed if needed  Continue Eliquis and atorvastatin  for secondary stroke prevention  Continue to follow up with PCP regarding cholesterol, blood pressure and diabetes management  Maintain strict control of hypertension with blood pressure goal below 130/90, diabetes with hemoglobin A1c goal below 7.0 % and cholesterol with LDL cholesterol (bad cholesterol) goal below 70 mg/dL.   Signs of a Stroke? Follow the BEFAST method:  Balance Watch for a sudden loss of balance, trouble with coordination or vertigo Eyes Is there a sudden loss of vision in one or both eyes? Or double vision?  Face: Ask the person to smile. Does one side of the face droop or is it numb?  Arms: Ask the person to raise both arms. Does one arm drift downward? Is there weakness or numbness of a leg? Speech: Ask the person to repeat a simple phrase. Does the speech sound slurred/strange? Is the person confused ? Time: If you observe any of these signs, call 911.        Thank you for coming to see [place] at [facility]. I hope we have been able to provide you high quality care today.  You may receive a patient satisfaction survey over the next few weeks. We would appreciate your feedback and comments so that we may continue to improve ourselves and the health of our patients.     Stroke Prevention Some medical conditions and lifestyle choices can lead to a higher risk for a stroke. You can help to prevent a stroke by eating healthy foods and exercising. It also helps to not smoke and to manage any health problems you may have. How can this condition affect me? A stroke is an emergency. It should be treated right away. A stroke can lead to brain damage or threaten your life. There is a better chance of surviving and getting better after a stroke if you get medical help right away. What can increase my  risk? The following medical conditions may increase your risk of a stroke: Diseases of the heart and blood vessels (cardiovascular disease). High blood pressure (hypertension). Diabetes. High cholesterol. Sickle cell disease. Problems with blood clotting. Being very overweight. Sleeping problems (obstructivesleep apnea). Other risk factors include: Being older than age 86. A history of blood clots, stroke, or mini-stroke (TIA). Race, ethnic background, or a family history of stroke. Smoking or using tobacco products. Taking birth control pills, especially if you smoke. Heavy alcohol and drug use. Not being active. What actions can I take to prevent this? Manage your health conditions High cholesterol. Eat a healthy diet. If this is not enough to manage your cholesterol, you may need to take medicines. Take medicines as told by your doctor. High blood pressure. Try to keep your blood pressure below 130/80. If your blood pressure cannot be managed through a healthy diet and regular exercise, you may need to take medicines. Take medicines as told by your doctor. Ask your doctor if you should check your blood pressure at home. Have your blood pressure checked every year. Diabetes. Eat a healthy diet and get regular exercise. If your blood sugar (glucose) cannot be managed through diet and exercise, you may need to take medicines. Take medicines as told by your doctor. Talk to your doctor about getting checked for sleeping problems. Signs of a problem can include: Snoring a lot. Feeling very tired. Make sure that you manage any other conditions you  have. Nutrition  Follow instructions from your doctor about what to eat or drink. You may be told to: Eat and drink fewer calories each day. Limit how much salt (sodium) you use to 1,500 milligrams (mg) each day. Use only healthy fats for cooking, such as olive oil, canola oil, and sunflower oil. Eat healthy foods. To do this: Choose  foods that are high in fiber. These include whole grains, and fresh fruits and vegetables. Eat at least 5 servings of fruits and vegetables a day. Try to fill one-half of your plate with fruits and vegetables at each meal. Choose low-fat (lean) proteins. These include low-fat cuts of meat, chicken without skin, fish, tofu, beans, and nuts. Eat low-fat dairy products. Avoid foods that: Are high in salt. Have saturated fat. Have trans fat. Have cholesterol. Are processed or pre-made. Count how many carbohydrates you eat and drink each day. Lifestyle If you drink alcohol: Limit how much you have to: 0-1 drink a day for women who are not pregnant. 0-2 drinks a day for men. Know how much alcohol is in your drink. In the [place] one drink equals one 12 oz bottle of beer ( ), one 5 oz glass of wine ( ), or one 1 oz glass of hard liquor (44mL). Do not smoke or use any products that have nicotine or tobacco. If you need help quitting, ask your doctor. Avoid secondhand smoke. Do not use drugs. Activity  Try to stay at a healthy weight. Get at least 30 minutes of exercise on most days, such as: Fast walking. Biking. Swimming. Medicines Take over-the-counter and prescription medicines only as told by your doctor. Avoid taking birth control pills. Talk to your doctor about the risks of taking birth control pills if: You are over 1 years old. You smoke. You get very bad headaches. You have had a blood clot. Where to find more information American Stroke Association: www.strokeassociation.org Get help right away if: You or a loved one has any signs of a stroke. "BE FAST" is an easy way to remember the warning signs: B - Balance. Dizziness, sudden trouble walking, or loss of balance. E - Eyes. Trouble seeing or a change in how you see. F - Face. Sudden weakness or loss of feeling of the face. The face or eyelid may droop on one side. A - Arms. Weakness or loss of feeling in an arm.  This happens all of a sudden and most often on one side of the body. S - Speech. Sudden trouble speaking, slurred speech, or trouble understanding what people say. T - Time. Time to call emergency services. Write down what time symptoms started. You or a loved one has other signs of a stroke, such as: A sudden, very bad headache with no known cause. Feeling like you may vomit (nausea). Vomiting. A seizure. These symptoms may be an emergency. Get help right away. Call your local emergency services (911 in the [place] Do not wait to see if the symptoms will go away. Do not drive yourself to the hospital. Summary You can help to prevent a stroke by eating healthy, exercising, and not smoking. It also helps to manage any health problems you have. Do not smoke or use any products that contain nicotine or tobacco. Get help right away if you or a loved one has any signs of a stroke. This information is not intended to replace advice given to you by your health care provider. Make sure you discuss any questions you have with your  health care provider. Document Revised: 09/13/2022 Document Reviewed: 09/13/2022 Elsevier Patient Education  2024 [employer].

## 2023-06-20 NOTE — Progress Notes (Signed)
[facility] [address] [place]. [place] [address] [phone]       HOSPITAL FOLLOW UP NOTE  [name] Date of Birth:  1951/06/28 Medical Record Number:  [id number]   Reason for Referral:  hospital stroke follow up    SUBJECTIVE:   CHIEF COMPLAINT:  Chief Complaint  Patient presents with   Follow-up    Rm 8, here with sister Rhonda Contreras  Pt is here for hospital follow up after stroke. Pt's sister states pt is doing therapy. Pt's sister states pt has hard time remembering some things.     HPI:   [name] is a 72 y.o. female with history of hypertension, diabetes, lupus on warfarin, prior CVA, CKD who presented to [facility] ED via EMS on 04/14/2023 for evaluation of right facial droop and right-sided weakness.  Stroke workup revealed left MCA distribution infarct likely in setting of hypercoagulability in setting of lupus on warfarin with subtherapeutic INR 1.9.  Recommended continuation of warfarin with goal INR 2.5-3.5, followed by [facility] physicians.  Continuation of atorvastatin, LDL 108.  A1c 6.6 on metformin and glimepiride.  Therapies recommended home health.    Today, 06/20/2023, patient is being seen for initial hospital follow-up accompanied by her sister.  Overall doing well since discharge.  Continued right-sided weakness and cognitive impairment.  Currently working with home health PT/OT/SLP making gradual recovery.  She does live alone, has a relative with her during the day 3 days weekly and sister stays with her on the weekends. Sister has noted that she can mix up her morning and night time medications. Will forget the name of her dog. She is legally blind, can only see outlines, routinely follows with ophthalmologist Dr. Hyacinth Meeker.  PCP has since switched her from warfarin to Eliquis, tolerating without side effects as well as atorvastatin Routinely follows with PCP for stroke risk factor management      PERTINENT IMAGING  Code  Stroke CT head No acute abnormality. ASPECTS 10.    CTA head & neck peripheral filling defect in the distal left common carotid, concerning for luminal thrombus MRI acute left MCA distribution infarct involving the left frontal operculum and superior insula 2D Echo EF 60 to 65% LDL 108 HgbA1c 6.6    ROS:   14 system review of systems performed and negative with exception of those listed in HPI  PMH:  Past Medical History:  Diagnosis Date   Arthritis    Cancer (HCC)    skin   Diabetes mellitus without complication (HCC)    Hypertension    Immune deficiency disorder (HCC)    Lupus (systemic lupus erythematosus) (HCC)    Renal disorder    Stage 2 KD   Stroke [facility])    2009    PSH:  Past Surgical History:  Procedure Laterality Date   CATARACT EXTRACTION Bilateral    FEMUR IM NAIL Left 02/02/2018   Procedure: LEFT FEMUR INTRAMEDULLARY (IM) NAIL;  Surgeon: Sheral Apley, MD;  Location: [facility] OR;  Service: Orthopedics;  Laterality: Left;   ORIF ELBOW FRACTURE Right 03/16/2023   Procedure: OPEN REDUCTION INTERNAL FIXATION (ORIF) ELBOW/OLECRANON FRACTURE;  Surgeon: [name], MD;  Location: [facility] ORS;  Service: Orthopedics;  Laterality: Right;   TONSILLECTOMY      Social History:  Social History   Socioeconomic History   Marital status: Single    Spouse name: Not on file   Number of children: Not on file   Years of education:  Not on file   Highest education level: Not on file  Occupational History   Not on file  Tobacco Use   Smoking status: Never   Smokeless tobacco: Never  Vaping Use   Vaping status: Never Used  Substance and Sexual Activity   Alcohol use: Not Currently   Drug use: Never   Sexual activity: Not Currently  Other Topics Concern   Not on file  Social History Narrative   Not on file   Social Determinants of Health   Financial Resource Strain: Not on file  Food Insecurity: No Food Insecurity (04/22/2023)   Hunger Vital Sign    Worried  About Running Out of Food in the Last Year: Never true    Ran Out of Food in the Last Year: Never true  Transportation Needs: No Transportation Needs (04/22/2023)   PRAPARE - Administrator, Civil Service (Medical): No    Lack of Transportation (Non-Medical): No  Physical Activity: Not on file  Stress: Not on file  Social Connections: Not on file  Intimate Partner Violence: Not At Risk (04/22/2023)   Humiliation, Afraid, Rape, and Kick questionnaire    Fear of Current or Ex-Partner: No    Emotionally Abused: No    Physically Abused: No    Sexually Abused: No    Family History:  Family History  Problem Relation Age of Onset   Heart attack Mother     Medications:   Current Outpatient Medications on File Prior to Visit  Medication Sig Dispense Refill   alendronate (FOSAMAX) 70 MG tablet Take 70 mg by mouth every Saturday.   12   atorvastatin (LIPITOR) 20 MG tablet Take 20 mg by mouth daily.  4   ELIQUIS 5 MG TABS tablet Take 5 mg by mouth 2 (two) times daily.     glimepiride (AMARYL) 1 MG tablet Take 1 mg by mouth daily.  4   loratadine (CLARITIN) 10 MG tablet Take 10 mg by mouth in the morning.     metFORMIN (GLUCOPHAGE) 1000 MG tablet Take 1,000 mg by mouth at bedtime.     metoprolol tartrate (LOPRESSOR) 100 MG tablet Take 100 mg by mouth 2 (two) times daily.     amLODipine (NORVASC) 10 MG tablet Take 10 mg by mouth daily. (Patient not taking: Reported on 06/20/2023)  3   No current facility-administered medications on file prior to visit.    Allergies:   Allergies  Allergen Reactions   Tape Other (See Comments)    CERTAIN MEDICAL TAPES HURT AND BURN THE SKIN      OBJECTIVE:  Physical Exam  Vitals:   06/20/23 0949  BP: 120/64  Weight: 112 lb (50.8 kg)  Height: 4\' 9"  (1.448 m)   Body mass index is 24.24 kg/m. No results found.   General: well developed, well nourished, very pleasant elderly Caucasian female, seated, in no evident distress Head:  head normocephalic and atraumatic.   Neck: supple with no carotid or supraclavicular bruits Cardiovascular: regular rate and rhythm, no murmurs Musculoskeletal: no deformity Skin:  no rash/petichiae Vascular:  Normal pulses all extremities   Neurologic Exam Mental Status: Awake and fully alert.  Unable to appreciate aphasia or dysarthria.  Disoriented to place and time, able to say name and DOB, states incorrect age. Recent and remote memory impaired. Attention span, concentration and fund of knowledge impaired. Mood and affect appropriate.  Cranial Nerves: Legally blind bilaterally (able to see outlines only). hearing intact. Facial sensation intact.  Mild  right facial weakness.  Tongue, palate moves normally and symmetrically.  Motor: Normal bulk and tone. Normal strength in all tested extremity muscles except mild right hand grip strength weakness and slight right ankle dorsiflexion weakness.  Orbits left arm or right arm. Sensory.: intact to touch , pinprick , position and vibratory sensation.  Coordination: Rapid alternating movements slightly impaired right hand. Finger-to-nose and heel-to-shin slightly impaired right side Gait and Station: Arises from chair without difficulty. Stance is normal. Gait demonstrates normal stride length and mild imbalance without AD.  Tandem walk and heel toe not attempted.  Reflexes: 1+ and symmetric. Toes downgoing.        ASSESSMENT: Rhonda Contreras is a 72 y.o. year old female with left MCA infarct on 04/22/2023 secondary to hypercoagulability in setting of lupus on warfarin with subtherapeutic INR. Vascular risk factors include HTN, HLD, DM, lupus, history of stroke and CKD.      PLAN:  Left MCA stroke:  Residual deficit: Mild right-sided weakness and cognitive impairment.  Discussed typical recovery time.  Continue working with home health therapy, consider transition to outpatient therapies once completed Continue Eliquis and atorvastatin  (Lipitor) for secondary stroke prevention managed/refilled by PCP Discussed secondary stroke prevention measures and importance of close PCP follow up for aggressive stroke risk factor management including BP goal<130/90, HLD with LDL goal<70 and DM with A1c.<7 .  Stroke labs 03/2023: LDL 108, A1c 6.6 I have gone over the pathophysiology of stroke, warning signs and symptoms, risk factors and their management in some detail with instructions to go to the closest emergency room for symptoms of concern.     Overall stable from stroke standpoint without further recommendations and recommend follow-up on an as-needed basis   CC:  [facility] provider: Dr. Pearlean Brownie PCP: Georgann Housekeeper, MD    I spent 59 minutes of face-to-face and non-face-to-face time with patient and sister.  This included previsit chart review including review of recent hospitalization, lab review, study review, order entry, electronic health record documentation, patient education regarding recent stroke including etiology, secondary stroke prevention measures and importance of managing stroke risk factors, residual deficits and typical recovery time and answered all other questions to patient and sister's satisfaction   Ihor Austin, AGNP-BC  [facility] [address] [place], [place] [address]  Phone [phone] Fax [phone] Note: This document was prepared with digital dictation and possible smart phrase technology. Any transcriptional errors that result from this process are unintentional.

## 2023-06-22 DIAGNOSIS — M810 Age-related osteoporosis without current pathological fracture: Secondary | ICD-10-CM | POA: Diagnosis not present

## 2023-06-22 DIAGNOSIS — D6862 Lupus anticoagulant syndrome: Secondary | ICD-10-CM | POA: Diagnosis not present

## 2023-06-22 DIAGNOSIS — I69391 Dysphagia following cerebral infarction: Secondary | ICD-10-CM | POA: Diagnosis not present

## 2023-06-22 DIAGNOSIS — I69351 Hemiplegia and hemiparesis following cerebral infarction affecting right dominant side: Secondary | ICD-10-CM | POA: Diagnosis not present

## 2023-06-22 DIAGNOSIS — I69322 Dysarthria following cerebral infarction: Secondary | ICD-10-CM | POA: Diagnosis not present

## 2023-06-22 DIAGNOSIS — L719 Rosacea, unspecified: Secondary | ICD-10-CM | POA: Diagnosis not present

## 2023-06-22 DIAGNOSIS — I700 Atherosclerosis of aorta: Secondary | ICD-10-CM | POA: Diagnosis not present

## 2023-06-22 DIAGNOSIS — I129 Hypertensive chronic kidney disease with stage 1 through stage 4 chronic kidney disease, or unspecified chronic kidney disease: Secondary | ICD-10-CM | POA: Diagnosis not present

## 2023-06-22 DIAGNOSIS — N1831 Chronic kidney disease, stage 3a: Secondary | ICD-10-CM | POA: Diagnosis not present

## 2023-06-22 DIAGNOSIS — E1122 Type 2 diabetes mellitus with diabetic chronic kidney disease: Secondary | ICD-10-CM | POA: Diagnosis not present

## 2023-06-27 DIAGNOSIS — E1122 Type 2 diabetes mellitus with diabetic chronic kidney disease: Secondary | ICD-10-CM | POA: Diagnosis not present

## 2023-06-27 DIAGNOSIS — L719 Rosacea, unspecified: Secondary | ICD-10-CM | POA: Diagnosis not present

## 2023-06-27 DIAGNOSIS — I129 Hypertensive chronic kidney disease with stage 1 through stage 4 chronic kidney disease, or unspecified chronic kidney disease: Secondary | ICD-10-CM | POA: Diagnosis not present

## 2023-06-27 DIAGNOSIS — I69351 Hemiplegia and hemiparesis following cerebral infarction affecting right dominant side: Secondary | ICD-10-CM | POA: Diagnosis not present

## 2023-06-27 DIAGNOSIS — I69322 Dysarthria following cerebral infarction: Secondary | ICD-10-CM | POA: Diagnosis not present

## 2023-06-27 DIAGNOSIS — I69391 Dysphagia following cerebral infarction: Secondary | ICD-10-CM | POA: Diagnosis not present

## 2023-06-27 DIAGNOSIS — M810 Age-related osteoporosis without current pathological fracture: Secondary | ICD-10-CM | POA: Diagnosis not present

## 2023-06-27 DIAGNOSIS — N1831 Chronic kidney disease, stage 3a: Secondary | ICD-10-CM | POA: Diagnosis not present

## 2023-06-27 DIAGNOSIS — I700 Atherosclerosis of aorta: Secondary | ICD-10-CM | POA: Diagnosis not present

## 2023-06-27 DIAGNOSIS — D6862 Lupus anticoagulant syndrome: Secondary | ICD-10-CM | POA: Diagnosis not present

## 2023-07-05 DIAGNOSIS — I700 Atherosclerosis of aorta: Secondary | ICD-10-CM | POA: Diagnosis not present

## 2023-07-05 DIAGNOSIS — I69391 Dysphagia following cerebral infarction: Secondary | ICD-10-CM | POA: Diagnosis not present

## 2023-07-05 DIAGNOSIS — L719 Rosacea, unspecified: Secondary | ICD-10-CM | POA: Diagnosis not present

## 2023-07-05 DIAGNOSIS — M810 Age-related osteoporosis without current pathological fracture: Secondary | ICD-10-CM | POA: Diagnosis not present

## 2023-07-05 DIAGNOSIS — D6862 Lupus anticoagulant syndrome: Secondary | ICD-10-CM | POA: Diagnosis not present

## 2023-07-05 DIAGNOSIS — I69351 Hemiplegia and hemiparesis following cerebral infarction affecting right dominant side: Secondary | ICD-10-CM | POA: Diagnosis not present

## 2023-07-05 DIAGNOSIS — I129 Hypertensive chronic kidney disease with stage 1 through stage 4 chronic kidney disease, or unspecified chronic kidney disease: Secondary | ICD-10-CM | POA: Diagnosis not present

## 2023-07-05 DIAGNOSIS — N1831 Chronic kidney disease, stage 3a: Secondary | ICD-10-CM | POA: Diagnosis not present

## 2023-07-05 DIAGNOSIS — E1122 Type 2 diabetes mellitus with diabetic chronic kidney disease: Secondary | ICD-10-CM | POA: Diagnosis not present

## 2023-07-05 DIAGNOSIS — I69322 Dysarthria following cerebral infarction: Secondary | ICD-10-CM | POA: Diagnosis not present

## 2023-07-20 DIAGNOSIS — I69391 Dysphagia following cerebral infarction: Secondary | ICD-10-CM | POA: Diagnosis not present

## 2023-07-20 DIAGNOSIS — L719 Rosacea, unspecified: Secondary | ICD-10-CM | POA: Diagnosis not present

## 2023-07-20 DIAGNOSIS — Z7984 Long term (current) use of oral hypoglycemic drugs: Secondary | ICD-10-CM | POA: Diagnosis not present

## 2023-07-20 DIAGNOSIS — I69322 Dysarthria following cerebral infarction: Secondary | ICD-10-CM | POA: Diagnosis not present

## 2023-07-20 DIAGNOSIS — F339 Major depressive disorder, recurrent, unspecified: Secondary | ICD-10-CM | POA: Diagnosis not present

## 2023-07-20 DIAGNOSIS — E785 Hyperlipidemia, unspecified: Secondary | ICD-10-CM | POA: Diagnosis not present

## 2023-07-20 DIAGNOSIS — E1122 Type 2 diabetes mellitus with diabetic chronic kidney disease: Secondary | ICD-10-CM | POA: Diagnosis not present

## 2023-07-20 DIAGNOSIS — I700 Atherosclerosis of aorta: Secondary | ICD-10-CM | POA: Diagnosis not present

## 2023-07-20 DIAGNOSIS — I69351 Hemiplegia and hemiparesis following cerebral infarction affecting right dominant side: Secondary | ICD-10-CM | POA: Diagnosis not present

## 2023-07-20 DIAGNOSIS — I129 Hypertensive chronic kidney disease with stage 1 through stage 4 chronic kidney disease, or unspecified chronic kidney disease: Secondary | ICD-10-CM | POA: Diagnosis not present

## 2023-07-20 DIAGNOSIS — Z7901 Long term (current) use of anticoagulants: Secondary | ICD-10-CM | POA: Diagnosis not present

## 2023-07-20 DIAGNOSIS — D6862 Lupus anticoagulant syndrome: Secondary | ICD-10-CM | POA: Diagnosis not present

## 2023-07-20 DIAGNOSIS — N1831 Chronic kidney disease, stage 3a: Secondary | ICD-10-CM | POA: Diagnosis not present

## 2023-07-20 DIAGNOSIS — M810 Age-related osteoporosis without current pathological fracture: Secondary | ICD-10-CM | POA: Diagnosis not present

## 2023-07-26 DIAGNOSIS — Z1231 Encounter for screening mammogram for malignant neoplasm of breast: Secondary | ICD-10-CM | POA: Diagnosis not present

## 2023-08-02 DIAGNOSIS — E1122 Type 2 diabetes mellitus with diabetic chronic kidney disease: Secondary | ICD-10-CM | POA: Diagnosis not present

## 2023-08-02 DIAGNOSIS — M810 Age-related osteoporosis without current pathological fracture: Secondary | ICD-10-CM | POA: Diagnosis not present

## 2023-08-02 DIAGNOSIS — I700 Atherosclerosis of aorta: Secondary | ICD-10-CM | POA: Diagnosis not present

## 2023-08-02 DIAGNOSIS — F339 Major depressive disorder, recurrent, unspecified: Secondary | ICD-10-CM | POA: Diagnosis not present

## 2023-08-02 DIAGNOSIS — Z7984 Long term (current) use of oral hypoglycemic drugs: Secondary | ICD-10-CM | POA: Diagnosis not present

## 2023-08-02 DIAGNOSIS — Z7901 Long term (current) use of anticoagulants: Secondary | ICD-10-CM | POA: Diagnosis not present

## 2023-08-02 DIAGNOSIS — I69351 Hemiplegia and hemiparesis following cerebral infarction affecting right dominant side: Secondary | ICD-10-CM | POA: Diagnosis not present

## 2023-08-02 DIAGNOSIS — D6862 Lupus anticoagulant syndrome: Secondary | ICD-10-CM | POA: Diagnosis not present

## 2023-08-02 DIAGNOSIS — I69391 Dysphagia following cerebral infarction: Secondary | ICD-10-CM | POA: Diagnosis not present

## 2023-08-02 DIAGNOSIS — N1831 Chronic kidney disease, stage 3a: Secondary | ICD-10-CM | POA: Diagnosis not present

## 2023-08-02 DIAGNOSIS — E785 Hyperlipidemia, unspecified: Secondary | ICD-10-CM | POA: Diagnosis not present

## 2023-08-02 DIAGNOSIS — L719 Rosacea, unspecified: Secondary | ICD-10-CM | POA: Diagnosis not present

## 2023-08-02 DIAGNOSIS — I69322 Dysarthria following cerebral infarction: Secondary | ICD-10-CM | POA: Diagnosis not present

## 2023-08-02 DIAGNOSIS — I129 Hypertensive chronic kidney disease with stage 1 through stage 4 chronic kidney disease, or unspecified chronic kidney disease: Secondary | ICD-10-CM | POA: Diagnosis not present

## 2023-08-10 DIAGNOSIS — F0150 Vascular dementia without behavioral disturbance: Secondary | ICD-10-CM | POA: Diagnosis not present

## 2023-08-10 DIAGNOSIS — E782 Mixed hyperlipidemia: Secondary | ICD-10-CM | POA: Diagnosis not present

## 2023-08-10 DIAGNOSIS — E1122 Type 2 diabetes mellitus with diabetic chronic kidney disease: Secondary | ICD-10-CM | POA: Diagnosis not present

## 2023-08-10 DIAGNOSIS — D6862 Lupus anticoagulant syndrome: Secondary | ICD-10-CM | POA: Diagnosis not present

## 2023-08-10 DIAGNOSIS — I700 Atherosclerosis of aorta: Secondary | ICD-10-CM | POA: Diagnosis not present

## 2023-08-10 DIAGNOSIS — Z23 Encounter for immunization: Secondary | ICD-10-CM | POA: Diagnosis not present

## 2023-08-10 DIAGNOSIS — Z8673 Personal history of transient ischemic attack (TIA), and cerebral infarction without residual deficits: Secondary | ICD-10-CM | POA: Diagnosis not present

## 2023-08-10 DIAGNOSIS — I69351 Hemiplegia and hemiparesis following cerebral infarction affecting right dominant side: Secondary | ICD-10-CM | POA: Diagnosis not present

## 2023-08-10 DIAGNOSIS — N1831 Chronic kidney disease, stage 3a: Secondary | ICD-10-CM | POA: Diagnosis not present

## 2023-08-10 DIAGNOSIS — I10 Essential (primary) hypertension: Secondary | ICD-10-CM | POA: Diagnosis not present

## 2023-08-19 DIAGNOSIS — E1122 Type 2 diabetes mellitus with diabetic chronic kidney disease: Secondary | ICD-10-CM | POA: Diagnosis not present

## 2023-08-19 DIAGNOSIS — I69351 Hemiplegia and hemiparesis following cerebral infarction affecting right dominant side: Secondary | ICD-10-CM | POA: Diagnosis not present

## 2023-08-19 DIAGNOSIS — M810 Age-related osteoporosis without current pathological fracture: Secondary | ICD-10-CM | POA: Diagnosis not present

## 2023-08-19 DIAGNOSIS — Z7901 Long term (current) use of anticoagulants: Secondary | ICD-10-CM | POA: Diagnosis not present

## 2023-08-19 DIAGNOSIS — I129 Hypertensive chronic kidney disease with stage 1 through stage 4 chronic kidney disease, or unspecified chronic kidney disease: Secondary | ICD-10-CM | POA: Diagnosis not present

## 2023-08-19 DIAGNOSIS — L719 Rosacea, unspecified: Secondary | ICD-10-CM | POA: Diagnosis not present

## 2023-08-19 DIAGNOSIS — Z7984 Long term (current) use of oral hypoglycemic drugs: Secondary | ICD-10-CM | POA: Diagnosis not present

## 2023-08-19 DIAGNOSIS — I700 Atherosclerosis of aorta: Secondary | ICD-10-CM | POA: Diagnosis not present

## 2023-08-19 DIAGNOSIS — F339 Major depressive disorder, recurrent, unspecified: Secondary | ICD-10-CM | POA: Diagnosis not present

## 2023-08-19 DIAGNOSIS — I69391 Dysphagia following cerebral infarction: Secondary | ICD-10-CM | POA: Diagnosis not present

## 2023-08-19 DIAGNOSIS — N1831 Chronic kidney disease, stage 3a: Secondary | ICD-10-CM | POA: Diagnosis not present

## 2023-08-19 DIAGNOSIS — I69322 Dysarthria following cerebral infarction: Secondary | ICD-10-CM | POA: Diagnosis not present

## 2023-08-19 DIAGNOSIS — D6862 Lupus anticoagulant syndrome: Secondary | ICD-10-CM | POA: Diagnosis not present

## 2023-08-19 DIAGNOSIS — E785 Hyperlipidemia, unspecified: Secondary | ICD-10-CM | POA: Diagnosis not present

## 2023-08-29 DIAGNOSIS — I700 Atherosclerosis of aorta: Secondary | ICD-10-CM | POA: Diagnosis not present

## 2023-08-29 DIAGNOSIS — I69322 Dysarthria following cerebral infarction: Secondary | ICD-10-CM | POA: Diagnosis not present

## 2023-08-29 DIAGNOSIS — Z7901 Long term (current) use of anticoagulants: Secondary | ICD-10-CM | POA: Diagnosis not present

## 2023-08-29 DIAGNOSIS — I129 Hypertensive chronic kidney disease with stage 1 through stage 4 chronic kidney disease, or unspecified chronic kidney disease: Secondary | ICD-10-CM | POA: Diagnosis not present

## 2023-08-29 DIAGNOSIS — M810 Age-related osteoporosis without current pathological fracture: Secondary | ICD-10-CM | POA: Diagnosis not present

## 2023-08-29 DIAGNOSIS — E785 Hyperlipidemia, unspecified: Secondary | ICD-10-CM | POA: Diagnosis not present

## 2023-08-29 DIAGNOSIS — F339 Major depressive disorder, recurrent, unspecified: Secondary | ICD-10-CM | POA: Diagnosis not present

## 2023-08-29 DIAGNOSIS — Z7984 Long term (current) use of oral hypoglycemic drugs: Secondary | ICD-10-CM | POA: Diagnosis not present

## 2023-08-29 DIAGNOSIS — L719 Rosacea, unspecified: Secondary | ICD-10-CM | POA: Diagnosis not present

## 2023-08-29 DIAGNOSIS — N1831 Chronic kidney disease, stage 3a: Secondary | ICD-10-CM | POA: Diagnosis not present

## 2023-08-29 DIAGNOSIS — D6862 Lupus anticoagulant syndrome: Secondary | ICD-10-CM | POA: Diagnosis not present

## 2023-08-29 DIAGNOSIS — I69391 Dysphagia following cerebral infarction: Secondary | ICD-10-CM | POA: Diagnosis not present

## 2023-08-29 DIAGNOSIS — E1122 Type 2 diabetes mellitus with diabetic chronic kidney disease: Secondary | ICD-10-CM | POA: Diagnosis not present

## 2023-08-29 DIAGNOSIS — I69351 Hemiplegia and hemiparesis following cerebral infarction affecting right dominant side: Secondary | ICD-10-CM | POA: Diagnosis not present

## 2023-09-20 DIAGNOSIS — D696 Thrombocytopenia, unspecified: Secondary | ICD-10-CM | POA: Diagnosis not present

## 2023-09-20 DIAGNOSIS — N182 Chronic kidney disease, stage 2 (mild): Secondary | ICD-10-CM | POA: Diagnosis not present

## 2023-09-20 DIAGNOSIS — I10 Essential (primary) hypertension: Secondary | ICD-10-CM | POA: Diagnosis not present

## 2023-09-20 DIAGNOSIS — E46 Unspecified protein-calorie malnutrition: Secondary | ICD-10-CM | POA: Diagnosis not present

## 2023-10-03 DIAGNOSIS — F0150 Vascular dementia without behavioral disturbance: Secondary | ICD-10-CM | POA: Diagnosis not present

## 2023-10-03 DIAGNOSIS — I10 Essential (primary) hypertension: Secondary | ICD-10-CM | POA: Diagnosis not present

## 2023-10-03 DIAGNOSIS — I700 Atherosclerosis of aorta: Secondary | ICD-10-CM | POA: Diagnosis not present

## 2023-11-26 DEATH — deceased
